# Patient Record
Sex: Female | Born: 1995 | Race: Black or African American | Hispanic: No | Marital: Single | Smoking: Never smoker
Health system: Southern US, Community
[De-identification: ages and names within clinical notes are randomized; demographics above are authoritative.]

## PROBLEM LIST (undated history)

## (undated) DIAGNOSIS — D649 Anemia, unspecified: Secondary | ICD-10-CM

## (undated) DIAGNOSIS — F410 Panic disorder [episodic paroxysmal anxiety] without agoraphobia: Secondary | ICD-10-CM

## (undated) HISTORY — PX: NO PAST SURGERIES: SHX2092

---
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
## 2018-06-02 NOTE — L&D Delivery Note (Signed)
Delivery Note At  0229am a viable and healthy Female "Yolanda Garcia" was delivered via  (Presentation:OA ;  ).  APGAR: 8,9 ; weight pending skin-to-skin  .   Placenta status: delivered intact with 3 vessel  Cord:  with the following complications: precipitous delivery at 36 weeks  Anesthesia:  none Episiotomy:  none Lacerations:  none Suture Repair: NA Est. Blood Loss (mL):    Mom to postpartum.  Baby to Couplet care / Skin to Skin.  [name] 02/14/2019, 2:44 AM

## 2018-07-06 ENCOUNTER — Encounter: Payer: Self-pay | Admitting: Emergency Medicine

## 2018-07-06 ENCOUNTER — Emergency Department: Payer: Medicaid Other

## 2018-07-06 ENCOUNTER — Emergency Department
Admission: EM | Admit: 2018-07-06 | Discharge: 2018-07-06 | Disposition: A | Discharge status: Home / Self Care | Payer: Medicaid Other | Attending: Student in an Organized Health Care Education/Training Program | Admitting: Student in an Organized Health Care Education/Training Program

## 2018-07-06 ENCOUNTER — Other Ambulatory Visit: Payer: Self-pay

## 2018-07-06 DIAGNOSIS — R102 Pelvic and perineal pain: Secondary | ICD-10-CM | POA: Insufficient documentation

## 2018-07-06 LAB — COMPREHENSIVE METABOLIC PANEL
ALK PHOS: 31 U/L — AB (ref 38–126)
ALT: 9 U/L (ref 0–44)
ANION GAP: 5 (ref 5–15)
AST: 17 U/L (ref 15–41)
Albumin: 4.2 g/dL (ref 3.5–5.0)
BUN: 10 mg/dL (ref 6–20)
CO2: 26 mmol/L (ref 22–32)
Calcium: 9 mg/dL (ref 8.9–10.3)
Chloride: 106 mmol/L (ref 98–111)
Creatinine, Ser: 0.61 mg/dL (ref 0.44–1.00)
GFR calc Af Amer: >60 mL/min (ref >60)
GFR calc non Af Amer: >60 mL/min (ref >60)
Glucose, Bld: 74 mg/dL (ref 70–99)
Potassium: 3.7 mmol/L (ref 3.5–5.1)
Sodium: 137 mmol/L (ref 135–145)
Total Bilirubin: 1.6 mg/dL — ABNORMAL HIGH (ref 0.3–1.2)
Total Protein: 7.3 g/dL (ref 6.5–8.1)

## 2018-07-06 LAB — CBC
HCT: 35.7 % — ABNORMAL LOW (ref 36.0–46.0)
Hemoglobin: 11.8 g/dL — ABNORMAL LOW (ref 12.0–15.0)
MCH: 27.4 pg (ref 26.0–34.0)
MCHC: 33.1 g/dL (ref 30.0–36.0)
MCV: 83 fL (ref 80.0–100.0)
Platelets: 217 10*3/uL (ref 150–400)
RBC: 4.3 MIL/uL (ref 3.87–5.11)
RDW: 12.2 % (ref 11.5–15.5)
WBC: 3.6 10*3/uL — ABNORMAL LOW (ref 4.0–10.5)
nRBC: 0 % (ref 0.0–0.2)

## 2018-07-06 LAB — URINALYSIS, COMPLETE (UACMP) WITH MICROSCOPIC
Bacteria, UA: NONE SEEN
Bilirubin Urine: NEGATIVE
Glucose, UA: NEGATIVE mg/dL
Hgb urine dipstick: NEGATIVE
Ketones, ur: NEGATIVE mg/dL
Leukocytes, UA: NEGATIVE
Nitrite: NEGATIVE
Protein, ur: NEGATIVE mg/dL
Specific Gravity, Urine: 1.019 (ref 1.005–1.030)
pH: 6 (ref 5.0–8.0)

## 2018-07-06 LAB — LIPASE, BLOOD: Lipase: 38 U/L (ref 11–51)

## 2018-07-06 LAB — POCT PREGNANCY, URINE: Preg Test, Ur: NEGATIVE

## 2018-07-06 NOTE — ED Notes (Signed)
Pt refused [place]. EDP aware. Pt wants to leave.

## 2018-07-06 NOTE — ED Notes (Signed)
Pt not present when RN arrived in room for DC.

## 2018-07-06 NOTE — ED Triage Notes (Addendum)
PT c/o abd pain x few days with nausea x2wks. Pt in NAD at this, VSS . Pt states positive preg test yesterday. LMP was early last month

## 2018-07-06 NOTE — Discharge Instructions (Signed)

## 2018-07-06 NOTE — ED Provider Notes (Signed)
[facility] Emergency Department Provider Note    First MD Initiated Contact with Patient 07/06/18 1641     (approximate)  I have reviewed the triage vital signs and the nursing notes.   HISTORY  Chief Complaint Abdominal Pain    HPI Yolanda Garcia is a 23 y.o. female presents the ER for evaluation of intermittent pelvic pain nausea and vomiting.  Patient states that she is late for her menstrual cycle her last menstrual cycle starting the beginning of January.  They took 2 home pregnancy test last night that were both positive.  Denies any fevers.  No dysuria.  No vaginal discharge or bleeding.    Past Medical History:  Diagnosis Date  . Panic attack    No family history on file. History reviewed. No pertinent surgical history. There are no active problems to display for this patient.     Prior to Admission medications   Medication Sig Start Date End Date Taking? Authorizing Provider  hydrOXYzine (ATARAX/VISTARIL) 25 MG tablet Take 1 tablet (25 mg total) by mouth 3 (three) times daily as needed. 09/08/16   Tommi Rumps, PA-C  predniSONE (DELTASONE) 10 MG tablet Take 6 tablets  today, on day 2 take 5 tablets, day 3 take 4 tablets, day 4 take 3 tablets, day 5 take  2 tablets and 1 tablet the last day 09/08/16   Tommi Rumps, PA-C  sulfamethoxazole-trimethoprim (BACTRIM,SEPTRA) 400-80 MG tablet Take 2 tablets by mouth 2 (two) times daily. 09/08/16   Tommi Rumps, PA-C    Allergies Patient has no known allergies.    Social History Social History   Tobacco Use  . Smoking status: Never Smoker  . Smokeless tobacco: Never Used  Substance Use Topics  . Alcohol use: No  . Drug use: Not on file    Review of Systems Patient denies headaches, rhinorrhea, blurry vision, numbness, shortness of breath, chest pain, edema, cough, abdominal pain, nausea, vomiting, diarrhea, dysuria, fevers, rashes or hallucinations unless otherwise stated  above in HPI. ____________________________________________   PHYSICAL EXAM:  VITAL SIGNS: Vitals:   07/06/18 1538  BP: (!) 103/52  Pulse: 91  Resp: 16  Temp: 98.4 F (36.9 C)  SpO2: 100%    Constitutional: Alert and oriented.  Eyes: Conjunctivae are normal.  Head: Atraumatic. Nose: No congestion/rhinnorhea. Mouth/Throat: Mucous membranes are moist.   Neck: No stridor. Painless ROM.  Cardiovascular: Normal rate, regular rhythm. Grossly normal heart sounds.  Good peripheral circulation. Respiratory: Normal respiratory effort.  No retractions. Lungs CTAB. Gastrointestinal: Soft and nontender. No distention. No abdominal bruits. No CVA tenderness. Genitourinary: deferred Musculoskeletal: No lower extremity tenderness nor edema.  No joint effusions. Neurologic:  Normal speech and language. No gross focal neurologic deficits are appreciated. No facial droop Skin:  Skin is warm, dry and intact. No rash noted. Psychiatric: Mood and affect are normal. Speech and behavior are normal.  ____________________________________________   LABS (all labs ordered are listed, but only abnormal results are displayed)  Results for orders placed or performed during the hospital encounter of 07/06/18 (from the past 24 hour(s))  Lipase, blood     Status: None   Collection Time: 07/06/18  3:41 PM  Result Value Ref Range   Lipase 38 11 - 51 U/L  Comprehensive metabolic panel     Status: Abnormal   Collection Time: 07/06/18  3:41 PM  Result Value Ref Range   Sodium 137 135 - 145 mmol/L   Potassium 3.7 3.5 - 5.1  mmol/L   Chloride 106 98 - 111 mmol/L   CO2 26 22 - 32 mmol/L   Glucose, Bld 74 70 - 99 mg/dL   BUN 10 6 - 20 mg/dL   Creatinine, Ser 2.950.61 0.44 - 1.00 mg/dL   Calcium 9.0 8.9 - 62.110.3 mg/dL   Total Protein 7.3 6.5 - 8.1 g/dL   Albumin 4.2 3.5 - 5.0 g/dL   AST 17 15 - 41 U/L   ALT 9 0 - 44 U/L   Alkaline Phosphatase 31 (L) 38 - 126 U/L   Total Bilirubin 1.6 (H) 0.3 - 1.2 mg/dL   GFR  calc non Af Amer >60 >60 mL/min   GFR calc Af Amer >60 >60 mL/min   Anion gap 5 5 - 15  CBC     Status: Abnormal   Collection Time: 07/06/18  3:41 PM  Result Value Ref Range   WBC 3.6 (L) 4.0 - 10.5 K/uL   RBC 4.30 3.87 - 5.11 MIL/uL   Hemoglobin 11.8 (L) 12.0 - 15.0 g/dL   HCT 30.835.7 (L) 65.736.0 - 84.646.0 %   MCV 83.0 80.0 - 100.0 fL   MCH 27.4 26.0 - 34.0 pg   MCHC 33.1 30.0 - 36.0 g/dL   RDW 96.212.2 95.211.5 - 84.115.5 %   Platelets 217 150 - 400 K/uL   nRBC 0.0 0.0 - 0.2 %  Urinalysis, Complete w Microscopic     Status: Abnormal   Collection Time: 07/06/18  3:41 PM  Result Value Ref Range   Color, Urine YELLOW (A) YELLOW   APPearance CLEAR (A) CLEAR   Specific Gravity, Urine 1.019 1.005 - 1.030   pH 6.0 5.0 - 8.0   Glucose, UA NEGATIVE NEGATIVE mg/dL   Hgb urine dipstick NEGATIVE NEGATIVE   Bilirubin Urine NEGATIVE NEGATIVE   Ketones, ur NEGATIVE NEGATIVE mg/dL   Protein, ur NEGATIVE NEGATIVE mg/dL   Nitrite NEGATIVE NEGATIVE   Leukocytes, UA NEGATIVE NEGATIVE   RBC / HPF 0-5 0 - 5 RBC/hpf   WBC, UA 0-5 0 - 5 WBC/hpf   Bacteria, UA NONE SEEN NONE SEEN   Squamous Epithelial / LPF 11-20 0 - 5   Mucus PRESENT   Pregnancy, urine POC     Status: None   Collection Time: 07/06/18  4:12 PM  Result Value Ref Range   Preg Test, Ur NEGATIVE NEGATIVE   ____________________________________________ ____________________________________________  RADIOLOGY  I personally reviewed all radiographic images ordered to evaluate for the above acute complaints and reviewed radiology reports and findings.  These findings were personally discussed with the patient.  Please see medical record for radiology report.  ____________________________________________   PROCEDURES  Procedure(s) performed:  Procedures    Critical Care performed: no ____________________________________________   INITIAL IMPRESSION / ASSESSMENT AND PLAN / ED COURSE  Pertinent labs & imaging results that were available during  my care of the patient were reviewed by me and considered in my medical decision making (see chart for details).   DDX: Ectopic, ovarian cyst, torsion, UTI  Yolanda Garcia is a 23 y.o. who presents to the ED with symptoms as described in the setting of presumed pregnancy based on home pregnancy test.  Repeat pregnancy here in the ER is negative.  Uncertain for etiology of abnormal test at home.  Blood work is otherwise reassuring.  Her abdominal exam is soft and benign.  Based on her presentation did recommend pelvic as well as ultrasound.  Patient agreeable  Clinical Course as of Jul 06 1756  Tue Jul 06, 2018  1756 Patient declining pelvic ultrasound at this time.  We discussed the fact that this would limit our ability to exclude more insidious process such as ovarian cyst or other pelvic pathology.  Patient demonstrates understanding of risks.  She is not pregnant is not having any pain at this time, I do believe that a 24-hour period of observation as an outpatient is a reasonable option.  I discussed signs and symptoms which she should return to the ER.   [PR]    Clinical Course User Index [PR] Willy Eddyobinson, Darlyne Schmiesing, MD     As part of my medical decision making, I reviewed the following data within the electronic MEDICAL RECORD NUMBER Nursing notes reviewed and incorporated, Labs reviewed, notes from prior ED visits and [place] Controlled Substance Database   ____________________________________________   FINAL CLINICAL IMPRESSION(S) / ED DIAGNOSES  Final diagnoses:  Pelvic pain  Pelvic pain      NEW MEDICATIONS STARTED DURING THIS VISIT:  New Prescriptions   No medications on file     Note:  This document was prepared using Dragon voice recognition software and may include unintentional dictation errors.    Willy Eddyobinson, Kenly Henckel, MD 07/06/18 [id number] [id number]

## 2018-07-18 ENCOUNTER — Encounter: Payer: Self-pay | Admitting: Emergency Medicine

## 2018-07-18 ENCOUNTER — Emergency Department: Payer: Medicaid Other

## 2018-07-18 ENCOUNTER — Emergency Department
Admission: EM | Admit: 2018-07-18 | Discharge: 2018-07-18 | Disposition: A | Discharge status: Home / Self Care | Payer: Medicaid Other | Attending: Emergency Medicine | Admitting: Emergency Medicine

## 2018-07-18 DIAGNOSIS — R1030 Lower abdominal pain, unspecified: Secondary | ICD-10-CM | POA: Insufficient documentation

## 2018-07-18 DIAGNOSIS — O9989 Other specified diseases and conditions complicating pregnancy, childbirth and the puerperium: Secondary | ICD-10-CM | POA: Diagnosis present

## 2018-07-18 DIAGNOSIS — O26899 Other specified pregnancy related conditions, unspecified trimester: Secondary | ICD-10-CM

## 2018-07-18 DIAGNOSIS — R109 Unspecified abdominal pain: Secondary | ICD-10-CM

## 2018-07-18 DIAGNOSIS — Z3A01 Less than 8 weeks gestation of pregnancy: Secondary | ICD-10-CM | POA: Diagnosis not present

## 2018-07-18 LAB — URINALYSIS, COMPLETE (UACMP) WITH MICROSCOPIC
Bacteria, UA: NONE SEEN
Bilirubin Urine: NEGATIVE
GLUCOSE, UA: NEGATIVE mg/dL
Hgb urine dipstick: NEGATIVE
Ketones, ur: NEGATIVE mg/dL
Leukocytes,Ua: NEGATIVE
Nitrite: NEGATIVE
Protein, ur: NEGATIVE mg/dL
Specific Gravity, Urine: 1.019 (ref 1.005–1.030)
pH: 6 (ref 5.0–8.0)

## 2018-07-18 LAB — LIPASE, BLOOD: Lipase: 36 U/L (ref 11–51)

## 2018-07-18 LAB — COMPREHENSIVE METABOLIC PANEL
ALBUMIN: 4.2 g/dL (ref 3.5–5.0)
ALT: 10 U/L (ref 0–44)
AST: 19 U/L (ref 15–41)
Alkaline Phosphatase: 33 U/L — ABNORMAL LOW (ref 38–126)
Anion gap: 5 (ref 5–15)
BUN: 11 mg/dL (ref 6–20)
CO2: 25 mmol/L (ref 22–32)
Calcium: 9 mg/dL (ref 8.9–10.3)
Chloride: 106 mmol/L (ref 98–111)
Creatinine, Ser: 0.51 mg/dL (ref 0.44–1.00)
GFR calc Af Amer: >60 mL/min (ref >60)
GFR calc non Af Amer: >60 mL/min (ref >60)
Glucose, Bld: 87 mg/dL (ref 70–99)
Potassium: 3.5 mmol/L (ref 3.5–5.1)
Sodium: 136 mmol/L (ref 135–145)
Total Bilirubin: 1.6 mg/dL — ABNORMAL HIGH (ref 0.3–1.2)
Total Protein: 6.6 g/dL (ref 6.5–8.1)

## 2018-07-18 LAB — POCT PREGNANCY, URINE: Preg Test, Ur: POSITIVE — AB

## 2018-07-18 LAB — HCG, QUANTITATIVE, PREGNANCY: hCG, Beta Chain, Quant, S: 42599 m[IU]/mL — ABNORMAL HIGH (ref <5)

## 2018-07-18 LAB — CBC
HCT: 33.2 % — ABNORMAL LOW (ref 36.0–46.0)
Hemoglobin: 11.1 g/dL — ABNORMAL LOW (ref 12.0–15.0)
MCH: 27.2 pg (ref 26.0–34.0)
MCHC: 33.4 g/dL (ref 30.0–36.0)
MCV: 81.4 fL (ref 80.0–100.0)
Platelets: 201 10*3/uL (ref 150–400)
RBC: 4.08 MIL/uL (ref 3.87–5.11)
RDW: 11.9 % (ref 11.5–15.5)
WBC: 3.6 10*3/uL — ABNORMAL LOW (ref 4.0–10.5)
nRBC: 0 % (ref 0.0–0.2)

## 2018-07-18 NOTE — ED Notes (Signed)
Patient transported to Ultrasound 

## 2018-07-18 NOTE — ED Triage Notes (Signed)
Pt c/o abdominal cramping was seen for same on 2/4 in ED was told she was not pregnant and since pt went to health department and was told she is [redacted] weeks pregnant. Pt to ED due to the cramping. Pt denies vaginal bleeding.

## 2018-07-18 NOTE — ED Provider Notes (Signed)
[facility] Emergency Department Provider Note  ____________________________________________   First MD Initiated Contact with Patient 07/18/18 [phone]     (approximate)  I have reviewed the triage vital signs and the nursing notes.   HISTORY  Chief Complaint Abdominal Pain    HPI Yolanda Garcia is a 23 y.o. female G1P0 at about [redacted] weeks gestation based on date of last menstrual period.  She presents for evaluation of intermittent and sometimes severe lower abdominal/pelvic cramping.  She was seen in this emergency department not quite 2 weeks ago after having some positive home pregnancy test.  Her urine pregnancy test was negative in the ED and she declined having an ultrasound, but she reports that she then followed up at the health department and her pricey test was positive and they told her she was [redacted] weeks gestation (that was based on her LMP).  She continues to have intermittent cramping so she wanted to get checked out.  She denies vaginal bleeding.  She denies fever/chills, chest pain, shortness of breath, nausea, vomiting, and any other abdominal pain.  This is the first time she has been pregnant.  She reports that her partner is relatively new to her and is "a friend".  Past Medical History:  Diagnosis Date  . Panic attack     There are no active problems to display for this patient.   History reviewed. No pertinent surgical history.  Prior to Admission medications   Medication Sig Start Date End Date Taking? Authorizing Provider  hydrOXYzine (ATARAX/VISTARIL) 25 MG tablet Take 1 tablet (25 mg total) by mouth 3 (three) times daily as needed. 09/08/16   Tommi RumpsSummers, Rhonda L, PA-C  predniSONE (DELTASONE) 10 MG tablet Take 6 tablets  today, on day 2 take 5 tablets, day 3 take 4 tablets, day 4 take 3 tablets, day 5 take  2 tablets and 1 tablet the last day 09/08/16   Tommi RumpsSummers, Rhonda L, PA-C  sulfamethoxazole-trimethoprim (BACTRIM,SEPTRA) 400-80 MG tablet  Take 2 tablets by mouth 2 (two) times daily. 09/08/16   Tommi RumpsSummers, Rhonda L, PA-C    Allergies Patient has no known allergies.  History reviewed. No pertinent family history.  Social History Social History   Tobacco Use  . Smoking status: Never Smoker  . Smokeless tobacco: Never Used  Substance Use Topics  . Alcohol use: No  . Drug use: Not on file    Review of Systems Constitutional: No fever/chills Eyes: No visual changes. ENT: No sore throat. Cardiovascular: Denies chest pain. Respiratory: Denies shortness of breath. Gastrointestinal: Lower abdominal/pelvic cramping.  No nausea, vomiting, nor diarrhea Genitourinary: No vaginal bleeding.  Negative for dysuria. Musculoskeletal: Negative for neck pain.  Negative for back pain. Integumentary: Negative for rash. Neurological: Negative for headaches, focal weakness or numbness.   ____________________________________________   PHYSICAL EXAM:  VITAL SIGNS: ED Triage Vitals  Enc Vitals Group     BP 07/18/18 0049 (!) 99/57     Pulse Rate 07/18/18 0049 92     Resp 07/18/18 0049 18     Temp 07/18/18 0049 98 F (36.7 C)     Temp Source 07/18/18 0049 Oral     SpO2 07/18/18 0049 98 %     Weight --      Height --      Head Circumference --      Peak Flow --      Pain Score 07/18/18 0529 0     Pain Loc --      Pain  Edu? --      Excl. in GC? --     Constitutional: Alert and oriented. Well appearing and in no acute distress. Eyes: Conjunctivae are normal.  Head: Atraumatic. Nose: No congestion/rhinnorhea. Mouth/Throat: Mucous membranes are moist. Neck: No stridor.  No meningeal signs.   Cardiovascular: Normal rate, regular rhythm. Good peripheral circulation. Grossly normal heart sounds. Respiratory: Normal respiratory effort.  No retractions. Lungs CTAB. Gastrointestinal: Soft and nontender. No distention.  Genitourinary: Patient declined pelvic exam Musculoskeletal: No lower extremity tenderness nor edema. No gross  deformities of extremities. Neurologic:  Normal speech and language. No gross focal neurologic deficits are appreciated.  Skin:  Skin is warm, dry and intact. No rash noted. Psychiatric: Mood and affect are normal. Speech and behavior are normal.  ____________________________________________   LABS (all labs ordered are listed, but only abnormal results are displayed)  Labs Reviewed  COMPREHENSIVE METABOLIC PANEL - Abnormal; Notable for the following components:      Result Value   Alkaline Phosphatase 33 (*)    Total Bilirubin 1.6 (*)    All other components within normal limits  CBC - Abnormal; Notable for the following components:   WBC 3.6 (*)    Hemoglobin 11.1 (*)    HCT 33.2 (*)    All other components within normal limits  URINALYSIS, COMPLETE (UACMP) WITH MICROSCOPIC - Abnormal; Notable for the following components:   Color, Urine YELLOW (*)    APPearance CLEAR (*)    All other components within normal limits  HCG, QUANTITATIVE, PREGNANCY - Abnormal; Notable for the following components:   hCG, Beta Chain, Quant, S 42,599 (*)    All other components within normal limits  POCT PREGNANCY, URINE - Abnormal; Notable for the following components:   Preg Test, Ur POSITIVE (*)    All other components within normal limits  LIPASE, BLOOD   ____________________________________________  EKG  None - EKG not ordered by ED physician ____________________________________________  RADIOLOGY   ED MD interpretation: Pelvic ultrasound is pending at time of sign-out     ____________________________________________   PROCEDURES  Critical Care performed: No   Procedure(s) performed:   Procedures   ____________________________________________   INITIAL IMPRESSION / ASSESSMENT AND PLAN / ED COURSE  As part of my medical decision making, I reviewed the following data within the electronic MEDICAL RECORD NUMBER Nursing notes reviewed and incorporated, Labs reviewed , Old  chart reviewed and Notes from prior ED visits    Differential diagnosis includes, but is not limited to, normal early pregnancy, ectopic pregnancy, missed abortion or nonviable pregnancy, STD/PID.  Lab results are generally reassuring with a positive urine pregnancy test and an hCG of about 42,000.  Vital signs are stable.  I recommended that we do a pelvic exam and take swabs but she declines in favor of following up with [facility] as scheduled in a couple of weeks for her prenatal appointment.  Given that she is having no other pelvic symptoms I think this is appropriate.  She has no tenderness to palpation of the abdomen.  I am ordering an ultrasound for her because of her concern over the cramping and to see if we can visualize an intrauterine pregnancy.  This is her second ED visit and she has been to the health department and I think it is reasonable to rule out an ectopic pregnancy even though she is not having any vaginal bleeding.  I did warn the patient that she may be too early along to visualize  a fetus at this time but we will check for any acute abnormalities and likely she will be able to follow-up with [facility].  She understands and agrees with the plan.  Clinical Course as of Jul 18 901  Wynelle Link Jul 18, 2018  0725 Patient is awaiting ultrasounds.  I discussed the case with Dr. Don Perking who is taking over for me in the emergency department.  Both the patient and Dr. Don Perking noted that the patient should be able to be discharged assuming that the ultrasound is not showing an acute or emergent medical condition such as an ectopic pregnancy.   [CF]    Clinical Course User Index [CF] Loleta Rose, MD    ____________________________________________  FINAL CLINICAL IMPRESSION(S) / ED DIAGNOSES  Final diagnoses:  Abdominal pain affecting pregnancy     MEDICATIONS GIVEN DURING THIS VISIT:  Medications - No data to display   ED Discharge Orders    None       Note:   This document was prepared using Dragon voice recognition software and may include unintentional dictation errors.   Loleta Rose, MD 07/18/18 [phone]

## 2018-07-18 NOTE — ED Notes (Signed)
Pt reports finding out she was pregnant 9 days ago; for the last week she's been having intermittent abd cramping that lasts for a few seconds and then resolves on it's own; pt says she had an episode tonight that brought her to the ED and has not had another episode while she's been waiting the last 5 hours for a treatment room; denies vaginal bleeding; LMP 06/02/18; pt says she has been seen at the health department and given a due date of 03/09/19; G1P0; abd soft, BS x 4; lungs clear; HRR; pt in no distress at this time; visitor x 1 at bedside;

## 2018-07-18 NOTE — ED Provider Notes (Signed)
_________________________ 9:01 AM on 07/18/2018 -----------------------------------------  US showing gestational sac with no fetal pole.  Recommended follow-up in 2 weeks for repeat ultrasound.  Patient has an appointment in 2 weeks with OB/GYN.  I explained to the patient that this could be early pregnancy versus a nonviable pregnancy.  If it is nonviable explained to her that she may have a heavier than normal menstrual.  Discussed in a return precautions. Currently no vaginal bleeding. Abdomen is soft with no tenderness on exam.       I have personally reviewed the images performed during this visit and I agree with the Radiologist's read.   Interpretation by Radiologist:  US Ob Transvaginal  Result Date: 07/18/2018 CLINICAL DATA:  Abdominal/pelvic cramping EXAM: OBSTETRIC <14 WK [place] AND TRANSVAGINAL OB US TECHNIQUE: Both transabdominal and transvaginal ultrasound examinations were performed for complete evaluation of the gestation as well as the maternal uterus, adnexal regions, and pelvic cul-de-sac. Transvaginal technique was performed to assess early pregnancy. COMPARISON:  None. FINDINGS: Intrauterine gestational sac: Visualized Yolk sac:  Visualized Embryo:  Not visualized Cardiac Activity: Not visualized. MSD: 16 mm   6 w   3 d Subchorionic hemorrhage:  None visualized. Maternal uterus/adnexae: Cervical os appears closed. Right ovary measures 3.3 x 2.3 x 2.0 cm. Left ovary measures 3.8 x 2.8 x 2.6 cm. Note that there is a corpus luteum on the left measuring 2.9 x 1.8 x 1.7 cm. A small amount of nearby free pelvic fluid noted. IMPRESSION: 1. Gestational sac is seen. Yolk sac is appreciable. Currently fetal pole not seen. Given this circumstance, advise follow-up study in 10-14 days to assess for fetal pole and fetal heart activity. 2.  No subchorionic hemorrhage evident. 3. Apparent corpus luteum on the left. Small amount nearby fluid may indicate leakage of fluid from the corpus luteum. No  other extrauterine pelvic mass. Electronically Signed   By: Bretta Bang III M.D.   On: 07/18/2018 08:19      Nita Sickle, MD 07/18/18 [phone]

## 2018-07-19 ENCOUNTER — Ambulatory Visit (INDEPENDENT_AMBULATORY_CARE_PROVIDER_SITE_OTHER): Payer: Medicaid Other | Admitting: Certified Nurse Midwife

## 2018-07-19 ENCOUNTER — Encounter: Payer: Self-pay | Admitting: Certified Nurse Midwife

## 2018-07-19 ENCOUNTER — Encounter: Payer: Medicaid Other | Admitting: Certified Nurse Midwife

## 2018-07-19 VITALS — BP 93/62 | HR 73 | Ht 63.0 in | Wt 95.6 lb

## 2018-07-19 DIAGNOSIS — R109 Unspecified abdominal pain: Secondary | ICD-10-CM

## 2018-07-19 DIAGNOSIS — O26891 Other specified pregnancy related conditions, first trimester: Secondary | ICD-10-CM

## 2018-07-19 DIAGNOSIS — Z3490 Encounter for supervision of normal pregnancy, unspecified, unspecified trimester: Secondary | ICD-10-CM

## 2018-07-19 NOTE — Patient Instructions (Signed)
Miscarriage  A miscarriage is the loss of an unborn baby (fetus) before the 20th week of pregnancy.  Follow these instructions at home:  Medicines     Take over-the-counter and prescription medicines only as told by your doctor.   If you were prescribed antibiotic medicine, take it as told by your doctor. Do not stop taking the antibiotic even if you start to feel better.   Do not take NSAIDs unless your doctor says that this is safe for you. NSAIDs include aspirin and ibuprofen. These medicines can cause bleeding.  Activity   Rest as directed. Ask your doctor what activities are safe for you.   Have someone help you at home during this time.  General instructions   Write down how many pads you use each day and how soaked they are.   Watch the amount of tissue or clumps of blood (blood clots) that you pass from your vagina. Save any large amounts of tissue for your doctor.   Do not use tampons, douche, or have sex until your doctor approves.   To help you and your partner with the process of grieving, talk with your doctor or seek counseling.   When you are ready, meet with your doctor to talk about steps you should take for your health. Also, talk with your doctor about steps to take to have a healthy pregnancy in the future.   Keep all follow-up visits as told by your doctor. This is important.  Contact a doctor if:   You have a fever or chills.   You have vaginal discharge that smells bad.   You have more bleeding.  Get help right away if:   You have very bad cramps or pain in your back or belly.   You pass clumps of blood that are walnut-sized or larger from your vagina.   You pass tissue that is walnut-sized or larger from your vagina.   You soak more than 1 regular pad in an hour.   You get light-headed or weak.   You faint (pass out).   You have feelings of sadness that do not go away, or you have thoughts of hurting yourself.  Summary   A miscarriage is the loss of an unborn baby before  the 20th week of pregnancy.   Follow your doctor's instructions for home care. Keep all follow-up appointments.   To help you and your partner with the process of grieving, talk with your doctor or seek counseling.  This information is not intended to replace advice given to you by your health care provider. Make sure you discuss any questions you have with your health care provider.  Document Released: 08/11/2011 Document Revised: 06/24/2016 Document Reviewed: 06/24/2016  Elsevier Interactive Patient Education  2019 [employer]  First Trimester of Pregnancy    The first trimester of pregnancy is from week 1 until the end of week 13 (months 1 through 3). During this time, your baby will begin to develop inside you. At 6-8 weeks, the eyes and face are formed, and the heartbeat can be seen on ultrasound. At the end of 12 weeks, all the baby's organs are formed. Prenatal care is all the medical care you receive before the birth of your baby. Make sure you get good prenatal care and follow all of your doctor's instructions.  Follow these instructions at home:  Medicines   Take over-the-counter and prescription medicines only as told by your doctor. Some medicines are safe and some medicines   are not safe during pregnancy.   Take a prenatal vitamin that contains at least 600 micrograms (mcg) of folic acid.   If you have trouble pooping (constipation), take medicine that will make your stool soft (stool softener) if your doctor approves.  Eating and drinking     Eat regular, healthy meals.   Your doctor will tell you the amount of weight gain that is right for you.   Avoid raw meat and uncooked cheese.   If you feel sick to your stomach (nauseous) or throw up (vomit):  ? Eat 4 or 5 small meals a day instead of 3 large meals.  ? Try eating a few soda crackers.  ? Drink liquids between meals instead of during meals.   To prevent constipation:  ? Eat foods that are high in fiber, like fresh fruits and vegetables,  whole grains, and beans.  ? Drink enough fluids to keep your pee (urine) clear or pale yellow.  Activity   Exercise only as told by your doctor. Stop exercising if you have cramps or pain in your lower belly (abdomen) or low back.   Do not exercise if it is too hot, too humid, or if you are in a place of great height (high altitude).   Try to avoid standing for long periods of time. Move your legs often if you must stand in one place for a long time.   Avoid heavy lifting.   Wear low-heeled shoes. Sit and stand up straight.   You can have sex unless your doctor tells you not to.  Relieving pain and discomfort   Wear a good support bra if your breasts are sore.   Take warm water baths (sitz baths) to soothe pain or discomfort caused by hemorrhoids. Use hemorrhoid cream if your doctor says it is okay.   Rest with your legs raised if you have leg cramps or low back pain.   If you have puffy, bulging veins (varicose veins) in your legs:  ? Wear support hose or compression stockings as told by your doctor.  ? Raise (elevate) your feet for 15 minutes, 3-4 times a day.  ? Limit salt in your food.  Prenatal care   Schedule your prenatal visits by the twelfth week of pregnancy.   Write down your questions. Take them to your prenatal visits.   Keep all your prenatal visits as told by your doctor. This is important.  Safety   Wear your seat belt at all times when driving.   Make a list of emergency phone numbers. The list should include numbers for family, friends, the hospital, and police and fire departments.  General instructions   Ask your doctor for a referral to a local prenatal class. Begin classes no later than at the start of month 6 of your pregnancy.   Ask for help if you need counseling or if you need help with nutrition. Your doctor can give you advice or tell you where to go for help.   Do not use hot tubs, steam rooms, or saunas.   Do not douche or use tampons or scented sanitary pads.   Do  not cross your legs for long periods of time.   Avoid all herbs and alcohol. Avoid drugs that are not approved by your doctor.   Do not use any tobacco products, including cigarettes, chewing tobacco, and electronic cigarettes. If you need help quitting, ask your doctor. You may get counseling or other support to help you quit.     Avoid cat litter boxes and soil used by cats. These carry germs that can cause birth defects in the baby and can cause a loss of your baby (miscarriage) or stillbirth.   Visit your dentist. At home, brush your teeth with a soft toothbrush. Be gentle when you floss.  Contact a doctor if:   You are dizzy.   You have mild cramps or pressure in your lower belly.   You have a nagging pain in your belly area.   You continue to feel sick to your stomach, you throw up, or you have watery poop (diarrhea).   You have a bad smelling fluid coming from your vagina.   You have pain when you pee (urinate).   You have increased puffiness (swelling) in your face, hands, legs, or ankles.  Get help right away if:   You have a fever.   You are leaking fluid from your vagina.   You have spotting or bleeding from your vagina.   You have very bad belly cramping or pain.   You gain or lose weight rapidly.   You throw up blood. It may look like coffee grounds.   You are around people who have German measles, fifth disease, or chickenpox.   You have a very bad headache.   You have shortness of breath.   You have any kind of trauma, such as from a fall or a car accident.  Summary   The first trimester of pregnancy is from week 1 until the end of week 13 (months 1 through 3).   To take care of yourself and your unborn baby, you will need to eat healthy meals, take medicines only if your doctor tells you to do so, and do activities that are safe for you and your baby.   Keep all follow-up visits as told by your doctor. This is important as your doctor will have to ensure that your baby is  healthy and growing well.  This information is not intended to replace advice given to you by your health care provider. Make sure you discuss any questions you have with your health care provider.  Document Released: 11/05/2007 Document Revised: 05/27/2016 Document Reviewed: 05/27/2016  Elsevier Interactive Patient Education  2019 [employer]

## 2018-07-19 NOTE — Progress Notes (Signed)
Patient here for ER follow-up, LMP was 06/02/2018.  Patient will like to discuss ultrasound done while in the ER.

## 2018-07-19 NOTE — Progress Notes (Signed)
GYN ENCOUNTER NOTE  Subjective:       Yolanda Garcia is a 23 y.o. G1P0 female here for ER follow up.   Seen yesterday in the [facility] ER for abdominal cramping in early pregnancy. Pt states that ER Physician told her she was having a miscarriage. She is accompanied by her cousin.   Denies difficulty breathing or respiratory distress, chest pain, vaginal bleeding, dysuria, and leg pain or swelling.    Gynecologic History  Patient's last menstrual period was 06/02/2018 (exact date).  Period Cycle (Days): 28 Period Duration (Days): Three (3) Period Pattern: Regular Menstrual Flow: Moderate Menstrual Control: Thin pad Dysmenorrhea: (!) Mild Dysmenorrhea Symptoms: Cramping  Contraception: none  Last Pap: N/A.   Obstetric History  OB History  Gravida Para Term Preterm AB Living  1            SAB TAB Ectopic Multiple Live Births               # Outcome Date GA Lbr Len/2nd Weight Sex Delivery Anes PTL Lv  1 Current             Past Medical History:  Diagnosis Date  . Panic attack     No past surgical history on file.  Current Outpatient Medications on File Prior to Visit  Medication Sig Dispense Refill  . Prenatal Vit-Fe Fumarate-FA (MULTIVITAMIN-PRENATAL) 27-0.8 MG TABS tablet Take 1 tablet by mouth daily at 12 noon.     No current facility-administered medications on file prior to visit.     No Known Allergies  Social History   Socioeconomic History  . Marital status: Single    Spouse name: Not on file  . Number of children: Not on file  . Years of education: Not on file  . Highest education level: Not on file  Occupational History  . Not on file  Social Needs  . Financial resource strain: Not on file  . Food insecurity:    Worry: Not on file    Inability: Not on file  . Transportation needs:    Medical: Not on file    Non-medical: Not on file  Tobacco Use  . Smoking status: Never Smoker  . Smokeless tobacco: Never Used  Substance and Sexual Activity   . Alcohol use: No  . Drug use: Not on file  . Sexual activity: Not on file  Lifestyle  . Physical activity:    Days per week: Not on file    Minutes per session: Not on file  . Stress: Not on file  Relationships  . Social connections:    Talks on phone: Not on file    Gets together: Not on file    Attends religious service: Not on file    Active member of club or organization: Not on file    Attends meetings of clubs or organizations: Not on file    Relationship status: Not on file  . Intimate partner violence:    Fear of current or ex partner: Not on file    Emotionally abused: Not on file    Physically abused: Not on file    Forced sexual activity: Not on file  Other Topics Concern  . Not on file  Social History Narrative  . Not on file    No family history on file.  The following portions of the patient's history were reviewed and updated as appropriate: allergies, current medications, past family history, past medical history, past social history, past surgical history and problem  list.  Review of Systems  ROS negative except as noted above. Information obtained from patient.   Objective:   BP 93/62   Pulse 73   Ht 5\' 3"  (1.6 m)   Wt 95 lb 9.6 oz (43.4 kg)   LMP 06/02/2018 (Exact Date)   BMI 16.93 kg/m    CONSTITUTIONAL: Well-developed, well-nourished female in no acute distress.    Collection Time: 07/18/18  1:01 AM  Result Value Ref Range   Lipase 36 11 - 51 U/L    Comment: Performed at [facility], [address] [place], [place] [address]  Comprehensive metabolic panel     Status: Abnormal   Collection Time: 07/18/18  1:01 AM  Result Value Ref Range   Sodium 136 135 - 145 mmol/L   Potassium 3.5 3.5 - 5.1 mmol/L   Chloride 106 98 - 111 mmol/L   CO2 25 22 - 32 mmol/L   Glucose, Bld 87 70 - 99 mg/dL   BUN 11 6 - 20 mg/dL   Creatinine, Ser 9.41 0.44 - 1.00 mg/dL   Calcium 9.0 8.9 - 74.0 mg/dL   Total Protein 6.6 6.5 - 8.1 g/dL   Albumin  4.2 3.5 - 5.0 g/dL   AST 19 15 - 41 U/L   ALT 10 0 - 44 U/L   Alkaline Phosphatase 33 (L) 38 - 126 U/L   Total Bilirubin 1.6 (H) 0.3 - 1.2 mg/dL   GFR calc non Af Amer >60 >60 mL/min   GFR calc Af Amer >60 >60 mL/min   Anion gap 5 5 - 15    Comment: Performed at [facility], [address] [place], [place] [address]  CBC     Status: Abnormal   Collection Time: 07/18/18  1:01 AM  Result Value Ref Range   WBC 3.6 (L) 4.0 - 10.5 K/uL   RBC 4.08 3.87 - 5.11 MIL/uL   Hemoglobin 11.1 (L) 12.0 - 15.0 g/dL   HCT 18.5 (L) 63.1 - 49.7 %   MCV 81.4 80.0 - 100.0 fL   MCH 27.2 26.0 - 34.0 pg   MCHC 33.4 30.0 - 36.0 g/dL   RDW 02.6 37.8 - 58.8 %   Platelets 201 150 - 400 K/uL   nRBC 0.0 0.0 - 0.2 %    Comment: Performed at [facility], [address] [place], [place] [address]  hCG, quantitative, pregnancy     Status: Abnormal   Collection Time: 07/18/18  1:01 AM  Result Value Ref Range   hCG, Beta Chain, Quant, S 42,599 (H) <5 mIU/mL    Comment:          GEST. AGE      CONC.  (mIU/mL)   <=1 WEEK        5 - 50     2 WEEKS       50 - 500     3 WEEKS       100 - 10,000     4 WEEKS     1,000 - 30,000     5 WEEKS     3,500 - 115,000   6-8 WEEKS     12,000 - 270,000    12 WEEKS     15,000 - 220,000        FEMALE AND NON-PREGNANT FEMALE:     LESS THAN 5 mIU/mL Performed at [facility], [address] [place], [place] [address]   Urinalysis, Complete w Microscopic  Status: Abnormal   Collection Time: 07/18/18  1:02 AM  Result Value Ref Range   Color, Urine YELLOW (A) YELLOW   APPearance CLEAR (A) CLEAR   Specific Gravity, Urine 1.019 1.005 - 1.030   pH 6.0 5.0 - 8.0   Glucose, UA NEGATIVE NEGATIVE mg/dL   Hgb urine dipstick NEGATIVE NEGATIVE   Bilirubin Urine NEGATIVE NEGATIVE   Ketones, ur NEGATIVE NEGATIVE mg/dL   Protein, ur NEGATIVE NEGATIVE mg/dL   Nitrite NEGATIVE NEGATIVE   Leukocytes,Ua NEGATIVE NEGATIVE   RBC / HPF 0-5 0 - 5 RBC/hpf    WBC, UA 0-5 0 - 5 WBC/hpf   Bacteria, UA NONE SEEN NONE SEEN   Squamous Epithelial / LPF 6-10 0 - 5   Mucus PRESENT     Comment: Performed at [facility], [address] [place], [place] [address]  Pregnancy, urine POC     Status: Abnormal   Collection Time: 07/18/18  1:12 AM  Result Value Ref Range   Preg Test, Ur POSITIVE (A) NEGATIVE    Comment:        THE SENSITIVITY OF THIS METHODOLOGY IS >24 mIU/mL     EXAM: OBSTETRIC <14 WK [place] AND TRANSVAGINAL OB US ([facility], 07/18/2018)  TECHNIQUE: Both transabdominal and transvaginal ultrasound examinations were performed for complete evaluation of the gestation as well as the maternal uterus, adnexal regions, and pelvic cul-de-sac. Transvaginal technique was performed to assess early pregnancy.  COMPARISON:  None.  FINDINGS: Intrauterine gestational sac: Visualized  Yolk sac:  Visualized  Embryo:  Not visualized  Cardiac Activity: Not visualized.  MSD: 16 mm   6 w   3 d  Subchorionic hemorrhage:  None visualized.  Maternal uterus/adnexae: Cervical os appears closed. Right ovary measures 3.3 x 2.3 x 2.0 cm. Left ovary measures 3.8 x 2.8 x 2.6 cm. Note that there is a corpus luteum on the left measuring 2.9 x 1.8 x 1.7 cm. A small amount of nearby free pelvic fluid noted.  IMPRESSION: 1. Gestational sac is seen. Yolk sac is appreciable. Currently fetal pole not seen. Given this circumstance, advise follow-up study in 10-14 days to assess for fetal pole and fetal heart activity.  2.  No subchorionic hemorrhage evident.  3. Apparent corpus luteum on the left. Small amount nearby fluid may indicate leakage of fluid from the corpus luteum. No other extrauterine pelvic mass.    Assessment:   1. Early stage of pregnancy  - Beta HCG, Quant  2. Abdominal pain during pregnancy in first trimester   Plan:   Ultrasound and lab results from ER visit reviewed with patient, verbalized understanding.    Discussed early pregnancy vs nonviable pregnancy.   Will repeat beta tomorrow.   Will repeat ultrasound next week.   Reviewed red flag symptoms and when to call.   RTC x 1 day for labs. RTC x 1 week for ultrasound or sooner if needed.    Gunnar Bulla, CNM [facility], [facility] 07/19/18 2:32 PM    A total of 30 minutes were spent face-to-face with the patient during the encounter with greater than 50% dealing with counseling and coordination of care.

## 2018-07-20 ENCOUNTER — Other Ambulatory Visit: Payer: Medicaid Other

## 2018-07-21 LAB — BETA HCG QUANT (REF LAB): hCG Quant: 47491 m[IU]/mL

## 2018-07-21 NOTE — Progress Notes (Signed)
Please contact pt, hCG slightly increased. Continue to monitor for vaginal bleeding and abdominal pain. Keep scheduled ultrasound and appt. Thanks, JML

## 2018-07-22 ENCOUNTER — Other Ambulatory Visit: Payer: Self-pay

## 2018-07-22 ENCOUNTER — Telehealth: Payer: Self-pay | Admitting: Certified Nurse Midwife

## 2018-07-22 ENCOUNTER — Encounter: Payer: Self-pay | Admitting: Emergency Medicine

## 2018-07-22 ENCOUNTER — Emergency Department
Admission: EM | Admit: 2018-07-22 | Discharge: 2018-07-22 | Disposition: A | Discharge status: Home / Self Care | Payer: Medicaid Other | Attending: Emergency Medicine | Admitting: Emergency Medicine

## 2018-07-22 ENCOUNTER — Telehealth: Payer: Self-pay

## 2018-07-22 DIAGNOSIS — Z3A01 Less than 8 weeks gestation of pregnancy: Secondary | ICD-10-CM | POA: Insufficient documentation

## 2018-07-22 DIAGNOSIS — O219 Vomiting of pregnancy, unspecified: Secondary | ICD-10-CM | POA: Insufficient documentation

## 2018-07-22 DIAGNOSIS — O210 Mild hyperemesis gravidarum: Secondary | ICD-10-CM

## 2018-07-22 LAB — CBC WITH DIFFERENTIAL/PLATELET
Abs Immature Granulocytes: 0.01 10*3/uL (ref 0.00–0.07)
Basophils Absolute: 0 10*3/uL (ref 0.0–0.1)
Basophils Relative: 1 %
Eosinophils Absolute: 0.1 10*3/uL (ref 0.0–0.5)
Eosinophils Relative: 1 %
HCT: 34.8 % — ABNORMAL LOW (ref 36.0–46.0)
Hemoglobin: 11.8 g/dL — ABNORMAL LOW (ref 12.0–15.0)
IMMATURE GRANULOCYTES: 0 %
Lymphocytes Relative: 34 %
Lymphs Abs: 1.3 10*3/uL (ref 0.7–4.0)
MCH: 27.3 pg (ref 26.0–34.0)
MCHC: 33.9 g/dL (ref 30.0–36.0)
MCV: 80.6 fL (ref 80.0–100.0)
Monocytes Absolute: 0.4 10*3/uL (ref 0.1–1.0)
Monocytes Relative: 11 %
Neutro Abs: 2 10*3/uL (ref 1.7–7.7)
Neutrophils Relative %: 53 %
PLATELETS: 212 10*3/uL (ref 150–400)
RBC: 4.32 MIL/uL (ref 3.87–5.11)
RDW: 11.7 % (ref 11.5–15.5)
WBC: 3.9 10*3/uL — ABNORMAL LOW (ref 4.0–10.5)
nRBC: 0 % (ref 0.0–0.2)

## 2018-07-22 LAB — URINALYSIS, COMPLETE (UACMP) WITH MICROSCOPIC
BACTERIA UA: NONE SEEN
Bilirubin Urine: NEGATIVE
Glucose, UA: NEGATIVE mg/dL
Hgb urine dipstick: NEGATIVE
Ketones, ur: 80 mg/dL — AB
Leukocytes,Ua: NEGATIVE
Nitrite: NEGATIVE
Protein, ur: NEGATIVE mg/dL
SPECIFIC GRAVITY, URINE: 1.023 (ref 1.005–1.030)
pH: 5 (ref 5.0–8.0)

## 2018-07-22 LAB — COMPREHENSIVE METABOLIC PANEL
ALT: 10 U/L (ref 0–44)
AST: 19 U/L (ref 15–41)
Albumin: 4.4 g/dL (ref 3.5–5.0)
Alkaline Phosphatase: 30 U/L — ABNORMAL LOW (ref 38–126)
Anion gap: 8 (ref 5–15)
BUN: 10 mg/dL (ref 6–20)
CALCIUM: 9.1 mg/dL (ref 8.9–10.3)
CO2: 23 mmol/L (ref 22–32)
Chloride: 103 mmol/L (ref 98–111)
Creatinine, Ser: 0.56 mg/dL (ref 0.44–1.00)
GFR calc Af Amer: >60 mL/min (ref >60)
GFR calc non Af Amer: >60 mL/min (ref >60)
Glucose, Bld: 90 mg/dL (ref 70–99)
Potassium: 3.5 mmol/L (ref 3.5–5.1)
Sodium: 134 mmol/L — ABNORMAL LOW (ref 135–145)
Total Bilirubin: 1.6 mg/dL — ABNORMAL HIGH (ref 0.3–1.2)
Total Protein: 7.4 g/dL (ref 6.5–8.1)

## 2018-07-22 LAB — HCG, QUANTITATIVE, PREGNANCY: hCG, Beta Chain, Quant, S: 82495 m[IU]/mL — ABNORMAL HIGH (ref <5)

## 2018-07-22 MED ORDER — ONDANSETRON HCL 4 MG/2ML IJ SOLN
4.0000 mg | Freq: Once | INTRAMUSCULAR | Status: AC
  Administered 2018-07-22: 4 mg via INTRAVENOUS
  Filled 2018-07-22: qty 2

## 2018-07-22 MED ORDER — SODIUM CHLORIDE 0.9 % IV SOLN
1000.0000 mL | Freq: Once | INTRAVENOUS | Status: AC
  Administered 2018-07-22: 1000 mL via INTRAVENOUS

## 2018-07-22 NOTE — ED Triage Notes (Signed)
Patient is [redacted] weeks pregnant and started vomiting yesterday morning. Patient denies any fever.

## 2018-07-22 NOTE — ED Provider Notes (Signed)
[facility] Emergency Department Provider Note   ____________________________________________    I have reviewed the triage vital signs and the nursing notes.   HISTORY  Chief Complaint Emesis     HPI Yolanda Garcia is a 23 y.o. female who presents with complaints of nausea vomiting.  Patient is approximately [redacted] weeks pregnant and notes she has been unable to tolerate p.o.'s over the last 24 hours.  She denies abdominal pain.  No vaginal bleeding.  No fevers or chills.  Has not yet seen OB.  This is her first pregnancy.  Has tried ginger ale but unable to tolerate  Past Medical History:  Diagnosis Date  . Panic attack     There are no active problems to display for this patient.   History reviewed. No pertinent surgical history.  Prior to Admission medications   Medication Sig Start Date End Date Taking? Authorizing Provider  Prenatal Vit-Fe Fumarate-FA (MULTIVITAMIN-PRENATAL) 27-0.8 MG TABS tablet Take 1 tablet by mouth daily at 12 noon.    [provider]     Allergies Patient has no known allergies.  No family history on file.  Social History Social History   Tobacco Use  . Smoking status: Never Smoker  . Smokeless tobacco: Never Used  Substance Use Topics  . Alcohol use: No  . Drug use: Not on file    Review of Systems  Constitutional: No fever/chills Eyes: No visual changes.  ENT: No sore throat. Cardiovascular: Denies chest pain. Respiratory: Denies shortness of breath. Gastrointestinal: As above Genitourinary: Negative for dysuria.  Vaginal bleeding Musculoskeletal: Negative for back pain. Skin: Negative for rash. Neurological: Negative for headaches    ____________________________________________   PHYSICAL EXAM:  VITAL SIGNS: ED Triage Vitals  Enc Vitals Group     BP 07/22/18 0621 (!) 110/42     Pulse Rate 07/22/18 0621 85     Resp 07/22/18 0621 17     Temp 07/22/18 0621 98.6 F (37 C)   Temp Source 07/22/18 0621 Oral     SpO2 07/22/18 0621 100 %     Weight 07/22/18 0622 43 kg (94 lb 12.8 oz)     Height 07/22/18 0622 1.6 m (5\' 3" )     Head Circumference --      Peak Flow --      Pain Score 07/22/18 0622 5     Pain Loc --      Pain Edu? --      Excl. in GC? --     Constitutional: Alert and oriented.  Nose: No congestion/rhinnorhea. Mouth/Throat: Mucous membranes are moist.  Pharynx normal  Cardiovascular: Normal rate, regular rhythm.  Good peripheral circulation. Respiratory: Normal respiratory effort.  No retractions. Gastrointestinal: Soft and nontender. No distention.  No CVA tenderness.  Musculoskeletal: No lower extremity tenderness nor edema.  Warm and well perfused Neurologic:  Normal speech and language. No gross focal neurologic deficits are appreciated.  Skin:  Skin is warm, dry and intact. No rash noted. Psychiatric: Mood and affect are normal. Speech and behavior are normal.  ____________________________________________   LABS (all labs ordered are listed, but only abnormal results are displayed)  Labs Reviewed  CBC WITH DIFFERENTIAL/PLATELET - Abnormal; Notable for the following components:      Result Value   WBC 3.9 (*)    Hemoglobin 11.8 (*)    HCT 34.8 (*)    All other components within normal limits  HCG, QUANTITATIVE, PREGNANCY - Abnormal; Notable for the following components:  hCG, Beta Chain, Quant, S 82,495 (*)    All other components within normal limits  URINALYSIS, COMPLETE (UACMP) WITH MICROSCOPIC - Abnormal; Notable for the following components:   Color, Urine YELLOW (*)    APPearance HAZY (*)    Ketones, ur 80 (*)    All other components within normal limits  COMPREHENSIVE METABOLIC PANEL - Abnormal; Notable for the following components:   Sodium 134 (*)    Alkaline Phosphatase 30 (*)    Total Bilirubin 1.6 (*)    All other components within normal limits    ____________________________________________  EKG  None ____________________________________________  RADIOLOGY   ____________________________________________   PROCEDURES  Procedure(s) performed: No  Procedures   Critical Care performed: No ____________________________________________   INITIAL IMPRESSION / ASSESSMENT AND PLAN / ED COURSE  Pertinent labs & imaging results that were available during my care of the patient were reviewed by me and considered in my medical decision making (see chart for details).  Patient overall well-appearing and in no acute distress, vital signs reassuring, exam is benign.  We will give IV fluids, single dose of IV antiemetics  ----------------------------------------- 9:03 AM on 07/22/2018 -----------------------------------------  On reexam patient is feeling improved, appropriate for discharge with outpatient follow-up, counseled patient on small meals other methods of dealing with morning sickness    ____________________________________________   FINAL CLINICAL IMPRESSION(S) / ED DIAGNOSES  Final diagnoses:  Morning sickness        Note:  This document was prepared using Dragon voice recognition software and may include unintentional dictation errors.   Jene Every, MD 07/22/18 [phone]

## 2018-07-22 NOTE — Telephone Encounter (Signed)
Pt has NOB appointment 07/27/18 and can discuss medication at that time.

## 2018-07-22 NOTE — Telephone Encounter (Signed)
The patient states she is at [facility] now, just got her IV hooked up, and she states the nurses there told her to get a RX for Zoloft, and she is asking for her recent lab results, please advise, thanks.

## 2018-07-27 ENCOUNTER — Encounter: Payer: Self-pay | Admitting: Certified Nurse Midwife

## 2018-07-27 ENCOUNTER — Ambulatory Visit (INDEPENDENT_AMBULATORY_CARE_PROVIDER_SITE_OTHER): Payer: Medicaid Other | Admitting: Certified Nurse Midwife

## 2018-07-27 ENCOUNTER — Ambulatory Visit (INDEPENDENT_AMBULATORY_CARE_PROVIDER_SITE_OTHER): Payer: Medicaid Other

## 2018-07-27 VITALS — BP 124/84 | HR 112 | Wt 95.9 lb

## 2018-07-27 DIAGNOSIS — Z3A01 Less than 8 weeks gestation of pregnancy: Secondary | ICD-10-CM

## 2018-07-27 DIAGNOSIS — N8312 Corpus luteum cyst of left ovary: Secondary | ICD-10-CM

## 2018-07-27 DIAGNOSIS — Z3401 Encounter for supervision of normal first pregnancy, first trimester: Secondary | ICD-10-CM

## 2018-07-27 DIAGNOSIS — O26891 Other specified pregnancy related conditions, first trimester: Secondary | ICD-10-CM

## 2018-07-27 DIAGNOSIS — Z3490 Encounter for supervision of normal pregnancy, unspecified, unspecified trimester: Secondary | ICD-10-CM

## 2018-07-27 DIAGNOSIS — O3481 Maternal care for other abnormalities of pelvic organs, first trimester: Secondary | ICD-10-CM

## 2018-07-27 DIAGNOSIS — R109 Unspecified abdominal pain: Secondary | ICD-10-CM

## 2018-07-27 MED ORDER — DICLEGIS 10-10 MG PO TBEC: 1.0000 | DELAYED_RELEASE_TABLET | Freq: Two times a day (BID) | ORAL | 2 refills | Status: DC

## 2018-07-27 NOTE — Progress Notes (Signed)
GYN ENCOUNTER NOTE  Subjective:       Yolanda Garcia is a 23 y.o. G1P0 female here for pregnancy confirmation.   Reports nausea with daily vomiting. Uncertain about going forward with this pregnancy.   Denies difficulty breathing or respiratory distress, chest pain, abdominal pain, vaginal bleeding, dysuria, and leg pain or swelling.    Gynecologic History  Patient's last menstrual period was 06/02/2018 (exact date).  Contraception: none  Last Pap: N/A.   Obstetric History  OB History  Gravida Para Term Preterm AB Living  1            SAB TAB Ectopic Multiple Live Births               # Outcome Date GA Lbr Len/2nd Weight Sex Delivery Anes PTL Lv  1 Current             Past Medical History:  Diagnosis Date  . Panic attack     History reviewed. No pertinent surgical history.  Current Outpatient Medications on File Prior to Visit  Medication Sig Dispense Refill  . Prenatal Vit-Fe Fumarate-FA (MULTIVITAMIN-PRENATAL) 27-0.8 MG TABS tablet Take 1 tablet by mouth daily at 12 noon.     No current facility-administered medications on file prior to visit.     No Known Allergies  Social History   Socioeconomic History  . Marital status: Single    Spouse name: Not on file  . Number of children: Not on file  . Years of education: Not on file  . Highest education level: Not on file  Occupational History  . Not on file  Social Needs  . Financial resource strain: Not on file  . Food insecurity:    Worry: Not on file    Inability: Not on file  . Transportation needs:    Medical: Not on file    Non-medical: Not on file  Tobacco Use  . Smoking status: Never Smoker  . Smokeless tobacco: Never Used  Substance and Sexual Activity  . Alcohol use: No  . Drug use: Never  . Sexual activity: Yes  Lifestyle  . Physical activity:    Days per week: Not on file    Minutes per session: Not on file  . Stress: Not on file  Relationships  . Social connections:    Talks on  phone: Not on file    Gets together: Not on file    Attends religious service: Not on file    Active member of club or organization: Not on file    Attends meetings of clubs or organizations: Not on file    Relationship status: Not on file  . Intimate partner violence:    Fear of current or ex partner: Not on file    Emotionally abused: Not on file    Physically abused: Not on file    Forced sexual activity: Not on file  Other Topics Concern  . Not on file  Social History Narrative  . Not on file    History reviewed. No pertinent family history.  The following portions of the patient's history were reviewed and updated as appropriate: allergies, current medications, past family history, past medical history, past social history, past surgical history and problem list.  Review of Systems  ROS negative except as noted above. Information obtained from patient.   Objective:   BP 124/84   Pulse (!) 112   Wt 95 lb 14.4 oz (43.5 kg)   LMP 06/02/2018 (Exact Date)   BMI  16.99 kg/m    CONSTITUTIONAL: Well-developed, well-nourished female in no acute distress.   ULTRASOUND REPORT  [facility]: [facility] Date of Service: 07/27/2018   Indications:dating Findings:  Mason Jim intrauterine pregnancy is visualized with a CRL consistent with [redacted]w[redacted]d gestation, giving an (U/S) EDD of 03/12/19.   FHR: 150 BPM CRL measurement: 12.4 mm Yolk sac is visualized and appears normal and early anatomy is normal. Amnion: visualized and appears normal   Right Ovary is normal in appearance. Left Ovary is normal appearance. Corpus luteal cyst:  Left ovary Survey of the adnexa demonstrates no adnexal masses. There is no free peritoneal fluid in the cul de sac.  Impression: 1. [redacted]w[redacted]d Viable Singleton Intrauterine pregnancy by U/S. 2. (U/S) EDD is 03/12/19.  Recommendations: 1.Clinical correlation with the patient's History and Physical Exam.    Assessment:   1. Encounter for supervision  of normal first pregnancy in first trimester   Plan:   Ultrasound findings discussed with patient, verbalized understanding. Information given regarding pregnancy options.   Rx: Diclegis, see orders.   First trimester education; see AVS.   Reviewed red flag symptoms and when to call.   RTC x 2-3 for nurse intake. RTC x 4-5 weeks for NOB physical or sooner if needed.    Gunnar Bulla, CNM [facility], [facility] 07/27/18 3:56 PM

## 2018-07-27 NOTE — Progress Notes (Signed)
NOB- pt went to ER, hyperemesis, had [place] today, pt would like to discuss abortion, states "shes not sure what she wants to do"

## 2018-07-27 NOTE — Patient Instructions (Signed)
WE WOULD LOVE TO HEAR FROM YOU!!!!   Thank you Yolanda Garcia for visiting [facility].  Providing our patients with the best experience possible is really important to [place], and we hope that you felt that on your recent visit. The most valuable feedback we get comes from [place]!!    If you receive a survey please take a couple of minutes to let us know how we did.Thank you for continuing to trust [place] with your care.   [facility]   WHAT OB PATIENTS CAN EXPECT   Confirmation of pregnancy and ultrasound ordered if medically indicated-[redacted] weeks gestation  New OB (NOB) intake with nurse and New OB (NOB) labs- [redacted] weeks gestation  New OB (NOB) physical examination with provider- 11/[redacted] weeks gestation  Flu vaccine-[redacted] weeks gestation  Anatomy scan-[redacted] weeks gestation  Glucose tolerance test, blood work to test for anemia, T-dap vaccine-[redacted] weeks gestation  Vaginal swabs/cultures-STD/Group B strep-[redacted] weeks gestation  Appointments every 4 weeks until 28 weeks  Every 2 weeks from 28 weeks until 36 weeks  Weekly visits from 36 weeks until delivery    Common Medications Safe in Pregnancy  Acne:      Constipation:  Benzoyl Peroxide     Colace  Clindamycin      Dulcolax Suppository  Topica Erythromycin     Fibercon  Salicylic Acid      Metamucil         Miralax AVOID:        Senakot   Accutane    Cough:  Retin-A       Cough Drops  Tetracycline      Phenergan w/ Codeine if Rx  Minocycline      Robitussin (Plain & DM)  Antibiotics:     Crabs/Lice:  Ceclor       RID  Cephalosporins    AVOID:  E-Mycins      Kwell  Keflex  Macrobid/Macrodantin   Diarrhea:  Penicillin      Kao-Pectate  Zithromax      Imodium AD         PUSH FLUIDS AVOID:       Cipro     Fever:  Tetracycline      Tylenol (Regular or Extra  Minocycline       Strength)  Levaquin      Extra Strength-Do not          Exceed 8 tabs/24 hrs Caffeine:        <261m/day (equiv. To 1  cup of coffee or  approx. 3 12 oz sodas)         Gas: Cold/Hayfever:       Gas-X  Benadryl      Mylicon  Claritin       Phazyme  **Claritin-D        Chlor-Trimeton    Headaches:  Dimetapp      ASA-Free Excedrin  Drixoral-Non-Drowsy     Cold Compress  Mucinex (Guaifenasin)     Tylenol (Regular or Extra  Sudafed/Sudafed-12 Hour     Strength)  **Sudafed PE Pseudoephedrine   Tylenol Cold & Sinus     Vicks Vapor Rub  Zyrtec  **AVOID if Problems With Blood Pressure         Heartburn: Avoid lying down for at least 1 hour after meals  Aciphex      Maalox     Rash:  Milk of Magnesia     Benadryl    Mylanta  1% Hydrocortisone Cream  Pepcid  Pepcid Complete   Sleep Aids:  Prevacid      Ambien   Prilosec       Benadryl  Rolaids       Chamomile Tea  Tums (Limit 4/day)     Unisom  Zantac       Tylenol PM         Warm milk-add vanilla or  Hemorrhoids:       Sugar for taste  Anusol/Anusol H.C.  (RX: Analapram 2.5%)  Sugar Substitutes:  Hydrocortisone OTC     Ok in moderation  Preparation H      Tucks        Vaseline lotion applied to tissue with wiping    Herpes:     Throat:  Acyclovir      Oragel  Famvir  Valtrex     Vaccines:         Flu Shot Leg Cramps:       *Gardasil  Benadryl      Hepatitis A         Hepatitis B Nasal Spray:       Pneumovax  Saline Nasal Spray     Polio Booster         Tetanus Nausea:       Tuberculosis test or PPD  Vitamin B6 25 mg TID   AVOID:    Dramamine      *Gardasil  Emetrol       Live Poliovirus  Ginger Root 250 mg QID    MMR (measles, mumps &  High Complex Carbs @ Bedtime    rebella)  Sea Bands-Accupressure    Varicella (Chickenpox)  Unisom 1/2 tab TID     *No known complications           If received before Pain:         Known pregnancy;   Darvocet       Resume series after  Lortab        Delivery  Percocet    Yeast:   Tramadol      Femstat  Tylenol  3      Gyne-lotrimin  Ultram       Monistat  Vicodin           MISC:         All Sunscreens           Hair Coloring/highlights          Insect Repellant's          (Including DEET)         My[place] Tans Eating Plan for Pregnant Women While you are pregnant, your body requires additional nutrition to help support your growing baby. You also have a higher need for some vitamins and minerals, such as folic acid, calcium, iron, and vitamin D. Eating a healthy, well-balanced diet is very important for your health and your baby's health. Your need for extra calories varies for the three 47-monthsegments of your pregnancy (trimesters). For most women, it is recommended to consume:  150 extra calories a day during the first trimester.  300 extra calories a day during the second trimester.  300 extra calories a day during the third trimester. What are tips for following this plan?   Do not try to lose weight or go on a diet during pregnancy.  Limit your overall intake of foods that have "empty calories." These are foods that have little nutritional value, such as sweets, desserts,  candies, and sugar-sweetened beverages.  Eat a variety of foods (especially fruits and vegetables) to get a full range of vitamins and minerals.  Take a prenatal vitamin to help meet your additional vitamin and mineral needs during pregnancy, specifically for folic acid, iron, calcium, and vitamin D.  Remember to stay active. Ask your health care provider what types of exercise and activities are safe for you.  Practice good food safety and cleanliness. Wash your hands before you eat and after you prepare raw meat. Wash all fruits and vegetables well before peeling or eating. Taking these actions can help to prevent food-borne illnesses that can be very dangerous to your baby, such as listeriosis. Ask your health care provider for more information about listeriosis. What does 150 extra calories look like? Healthy  options that provide 150 extra calories each day could be any of the following:  6-8 oz (170-230 g) of plain low-fat yogurt with  cup of berries.  1 apple with 2 teaspoons (11 g) of peanut butter.  Cut-up vegetables with  cup (60 g) of hummus.  8 oz (230 mL) or 1 cup of low-fat chocolate milk.  1 stick of string cheese with 1 medium orange.  1 peanut butter and jelly sandwich that is made with one slice of whole-wheat bread and 1 tsp (5 g) of peanut butter. For 300 extra calories, you could eat two of those healthy options each day. What is a healthy amount of weight to gain? The right amount of weight gain for you is based on your BMI before you became pregnant. If your BMI:  Was less than 18 (underweight), you should gain 28-40 lb (13-18 kg).  Was 18-24.9 (normal), you should gain 25-35 lb (11-16 kg).  Was 25-29.9 (overweight), you should gain 15-25 lb (7-11 kg).  Was 30 or greater (obese), you should gain 11-20 lb (5-9 kg). What if I am having twins or multiples? Generally, if you are carrying twins or multiples:  You may need to eat 300-600 extra calories a day.  The recommended range for total weight gain is 25-54 lb (11-25 kg), depending on your BMI before pregnancy.  Talk with your health care provider to find out about nutritional needs, weight gain, and exercise that is right for you. What foods can I eat?  Grains All grains. Choose whole grains, such as whole-wheat bread, oatmeal, or brown rice. Vegetables All vegetables. Eat a variety of colors and types of vegetables. Remember to wash your vegetables well before peeling or eating. Fruits All fruits. Eat a variety of colors and types of fruit. Remember to wash your fruits well before peeling or eating. Meats and other protein foods Lean meats, including chicken, [place], fish, and lean cuts of beef, veal, or pork. If you eat fish or seafood, choose options that are higher in omega-3 fatty acids and lower in  mercury, such as salmon, herring, mussels, trout, sardines, pollock, shrimp, crab, and lobster. Tofu. Tempeh. Beans. Eggs. Peanut butter and other nut butters. Make sure that all meats, poultry, and eggs are cooked to food-safe temperatures or "well-done." Two or more servings of fish are recommended each week in order to get the most benefits from omega-3 fatty acids that are found in seafood. Choose fish that are lower in mercury. You can find more information online:  [place]Gaming.ch Dairy Pasteurized milk and milk alternatives (such as almond milk). Pasteurized yogurt and pasteurized cheese. Cottage cheese. Sour cream. Beverages Water. Juices that contain 100% fruit juice or vegetable  juice. Caffeine-free teas and decaffeinated coffee. Drinks that contain caffeine are okay to drink, but it is better to avoid caffeine. Keep your total caffeine intake to less than 200 mg each day (which is 12 oz or 355 mL of coffee, tea, or soda) or the limit as told by your health care provider. Fats and oils Fats and oils are okay to include in moderation. Sweets and desserts Sweets and desserts are okay to include in moderation. Seasoning and other foods All pasteurized condiments. The items listed above may not be a complete list of recommended foods and beverages. Contact your dietitian for more options. What foods are not recommended? Vegetables Raw (unpasteurized) vegetable juices. Fruits Unpasteurized fruit juices. Meats and other protein foods Lunch meats, bologna, hot dogs, or other deli meats. (If you must eat those meats, reheat them until they are steaming hot.) Refrigerated pat, meat spreads from a meat counter, smoked seafood that is found in the refrigerated section of a store. Raw or undercooked meats, poultry, and eggs. Raw fish, such as sushi or sashimi. Fish that have high mercury content, such as tilefish, shark, swordfish, and king mackerel. To learn more about mercury in fish, talk with  your health care provider or look for online resources, such as:  [place]Gaming.ch Dairy Raw (unpasteurized) milk and any foods that have raw milk in them. Soft cheeses, such as feta, queso blanco, queso fresco, Brie, Camembert cheeses, blue-veined cheeses, and Panela cheese (unless it is made with pasteurized milk, which must be stated on the label). Beverages Alcohol. Sugar-sweetened beverages, such as sodas, teas, or energy drinks. Seasoning and other foods Homemade fermented foods and drinks, such as pickles, sauerkraut, or kombucha drinks. (Store-bought pasteurized versions of these are okay.) Salads that are made in a store or deli, such as ham salad, chicken salad, egg salad, tuna salad, and seafood salad. The items listed above may not be a complete list of foods and beverages to avoid. Contact your dietitian for more information. Where to find more information To calculate the number of calories you need based on your height, weight, and activity level, you can use an online calculator such as:  MobileTransition.ch To calculate how much weight you should gain during pregnancy, you can use an online pregnancy weight gain calculator such as:  StreamingFood.com.cy Summary  While you are pregnant, your body requires additional nutrition to help support your growing baby.  Eat a variety of foods, especially fruits and vegetables to get a full range of vitamins and minerals.  Practice good food safety and cleanliness. Wash your hands before you eat and after you prepare raw meat. Wash all fruits and vegetables well before peeling or eating. Taking these actions can help to prevent food-borne illnesses, such as listeriosis, that can be very dangerous to your baby.  Do not eat raw meat or fish. Do not eat fish that have high mercury content, such as tilefish, shark, swordfish, and king mackerel. Do not eat unpasteurized (raw) dairy.  Take a  prenatal vitamin to help meet your additional vitamin and mineral needs during pregnancy, specifically for folic acid, iron, calcium, and vitamin D. This information is not intended to replace advice given to you by your health care provider. Make sure you discuss any questions you have with your health care provider. Document Released: 03/03/2014 Document Revised: 02/13/2017 Document Reviewed: 02/13/2017 Elsevier Interactive Patient Education  2019 [place] of Pregnancy  The first trimester of pregnancy is from week 1 until the end  of week 13 (months 1 through 3). During this time, your baby will begin to develop inside you. At 6-8 weeks, the eyes and face are formed, and the heartbeat can be seen on ultrasound. At the end of 12 weeks, all the baby's organs are formed. Prenatal care is all the medical care you receive before the birth of your baby. Make sure you get good prenatal care and follow all of your doctor's instructions. Follow these instructions at home: Medicines  Take over-the-counter and prescription medicines only as told by your doctor. Some medicines are safe and some medicines are not safe during pregnancy.  Take a prenatal vitamin that contains at least 600 micrograms (mcg) of folic acid.  If you have trouble pooping (constipation), take medicine that will make your stool soft (stool softener) if your doctor approves. Eating and drinking   Eat regular, healthy meals.  Your doctor will tell you the amount of weight gain that is right for you.  Avoid raw meat and uncooked cheese.  If you feel sick to your stomach (nauseous) or throw up (vomit): ? Eat 4 or 5 small meals a day instead of 3 large meals. ? Try eating a few soda crackers. ? Drink liquids between meals instead of during meals.  To prevent constipation: ? Eat foods that are high in fiber, like fresh fruits and vegetables, whole grains, and beans. ? Drink enough fluids to keep your pee  (urine) clear or pale yellow. Activity  Exercise only as told by your doctor. Stop exercising if you have cramps or pain in your lower belly (abdomen) or low back.  Do not exercise if it is too hot, too humid, or if you are in a place of great height (high altitude).  Try to avoid standing for long periods of time. Move your legs often if you must stand in one place for a long time.  Avoid heavy lifting.  Wear low-heeled shoes. Sit and stand up straight.  You can have sex unless your doctor tells you not to. Relieving pain and discomfort  Wear a good support bra if your breasts are sore.  Take warm water baths (sitz baths) to soothe pain or discomfort caused by hemorrhoids. Use hemorrhoid cream if your doctor says it is okay.  Rest with your legs raised if you have leg cramps or low back pain.  If you have puffy, bulging veins (varicose veins) in your legs: ? Wear support hose or compression stockings as told by your doctor. ? Raise (elevate) your feet for 15 minutes, 3-4 times a day. ? Limit salt in your food. Prenatal care  Schedule your prenatal visits by the twelfth week of pregnancy.  Write down your questions. Take them to your prenatal visits.  Keep all your prenatal visits as told by your doctor. This is important. Safety  Wear your seat belt at all times when driving.  Make a list of emergency phone numbers. The list should include numbers for family, friends, the hospital, and police and fire departments. General instructions  Ask your doctor for a referral to a local prenatal class. Begin classes no later than at the start of month 6 of your pregnancy.  Ask for help if you need counseling or if you need help with nutrition. Your doctor can give you advice or tell you where to go for help.  Do not use hot tubs, steam rooms, or saunas.  Do not douche or use tampons or scented sanitary pads.  Do not  cross your legs for long periods of time.  Avoid all herbs  and alcohol. Avoid drugs that are not approved by your doctor.  Do not use any tobacco products, including cigarettes, chewing tobacco, and electronic cigarettes. If you need help quitting, ask your doctor. You may get counseling or other support to help you quit.  Avoid cat litter boxes and soil used by cats. These carry germs that can cause birth defects in the baby and can cause a loss of your baby (miscarriage) or stillbirth.  Visit your dentist. At home, brush your teeth with a soft toothbrush. Be gentle when you floss. Contact a doctor if:  You are dizzy.  You have mild cramps or pressure in your lower belly.  You have a nagging pain in your belly area.  You continue to feel sick to your stomach, you throw up, or you have watery poop (diarrhea).  You have a bad smelling fluid coming from your vagina.  You have pain when you pee (urinate).  You have increased puffiness (swelling) in your face, hands, legs, or ankles. Get help right away if:  You have a fever.  You are leaking fluid from your vagina.  You have spotting or bleeding from your vagina.  You have very bad belly cramping or pain.  You gain or lose weight rapidly.  You throw up blood. It may look like coffee grounds.  You are around people who have [place] measles, fifth disease, or chickenpox.  You have a very bad headache.  You have shortness of breath.  You have any kind of trauma, such as from a fall or a car accident. Summary  The first trimester of pregnancy is from week 1 until the end of week 13 (months 1 through 3).  To take care of yourself and your unborn baby, you will need to eat healthy meals, take medicines only if your doctor tells you to do so, and do activities that are safe for you and your baby.  Keep all follow-up visits as told by your doctor. This is important as your doctor will have to ensure that your baby is healthy and growing well. This information is not intended to replace  advice given to you by your health care provider. Make sure you discuss any questions you have with your health care provider. Document Released: 11/05/2007 Document Revised: 05/27/2016 Document Reviewed: 05/27/2016 Elsevier Interactive Patient Education  2019 [employer].

## 2018-08-16 ENCOUNTER — Other Ambulatory Visit: Payer: Self-pay

## 2018-08-16 ENCOUNTER — Ambulatory Visit (INDEPENDENT_AMBULATORY_CARE_PROVIDER_SITE_OTHER): Payer: Medicaid Other | Admitting: Certified Nurse Midwife

## 2018-08-16 VITALS — BP 92/61 | HR 92 | Ht 63.0 in | Wt 94.3 lb

## 2018-08-16 DIAGNOSIS — Z3401 Encounter for supervision of normal first pregnancy, first trimester: Secondary | ICD-10-CM

## 2018-08-16 DIAGNOSIS — Z0283 Encounter for blood-alcohol and blood-drug test: Secondary | ICD-10-CM

## 2018-08-16 DIAGNOSIS — Z113 Encounter for screening for infections with a predominantly sexual mode of transmission: Secondary | ICD-10-CM

## 2018-08-16 LAB — OB RESULTS CONSOLE VARICELLA ZOSTER ANTIBODY, IGG: Varicella: NON-IMMUNE/NOT IMMUNE

## 2018-08-16 NOTE — Progress Notes (Signed)
      Yolanda Garcia presents for NOB nurse intake visit. Pregnancy confirmation done at Emergency Dept., 07/06/18, with Willy Eddy.  G1.  P0.  LMP 06/02/18.  EDD 03/12/19.  Ga [redacted]w[redacted]d. Pregnancy education material explained and given.  0 cats in the home.  NOB labs ordered. BMI less than 30. TSH/HbgA1 not ordered. Sickle cell order due to race. HIV and drug screen explained and ordered. Genetic screening discussed. Genetic testing; Unsure. Pt to discuss genetic testing with provider. PNV encouraged. Pt to follow up with provider in 2 weeks for NOB physical.  [facility] Financial Policy and FMLA forms explained and signed. Pt stated taht she understood all information given. Pt stated that she is not taking her prenatal vitamins due to them making her sick on the stomach.  Prenatal vitamin samples given: vitalfol gummies, vitafol nano, prenate mini, prenate pixie and vitafol strips   BP 92/61   Pulse 92   Ht 5\' 3"  (1.6 m)   Wt 94 lb 4.8 oz (42.8 kg)   LMP 06/02/2018 (Exact Date)   BMI 16.70 kg/m

## 2018-08-16 NOTE — Patient Instructions (Signed)
WHAT OB PATIENTS CAN EXPECT   Confirmation of pregnancy and ultrasound ordered if medically indicated-[redacted] weeks gestation  New OB (NOB) intake with nurse and New OB (NOB) labs- [redacted] weeks gestation  New OB (NOB) physical examination with provider- 11/[redacted] weeks gestation  Flu vaccine-[redacted] weeks gestation  Anatomy scan-[redacted] weeks gestation  Glucose tolerance test, blood work to test for anemia, T-dap vaccine-[redacted] weeks gestation  Vaginal swabs/cultures-STD/Group B strep-[redacted] weeks gestation  Appointments every 4 weeks until 28 weeks  Every 2 weeks from 28 weeks until 36 weeks  Weekly visits from 36 weeks until delivery  Prenatal Care Prenatal care is health care during pregnancy. It helps you and your unborn baby (fetus) stay as healthy as possible. Prenatal care may be provided by a midwife, a family practice health care provider, or a childbirth and pregnancy specialist (obstetrician). How does this affect me? During pregnancy, you will be closely monitored for any new conditions that might develop. To lower your risk of pregnancy complications, you and your health care provider will talk about any underlying conditions you have. How does this affect my baby? Early and consistent prenatal care increases the chance that your baby will be healthy during pregnancy. Prenatal care lowers the risk that your baby will be:  Born early (prematurely).  Smaller than expected at birth (small for gestational age). What can I expect at the first prenatal care visit? Your first prenatal care visit will likely be the longest. You should schedule your first prenatal care visit as soon as you know that you are pregnant. Your first visit is a good time to talk about any questions or concerns you have about pregnancy. At your visit, you and your health care provider will talk about:  Your medical history, including: ? Any past pregnancies. ? Your family's medical history. ? The baby's father's medical  history. ? Any long-term (chronic) health conditions you have and how you manage them. ? Any surgeries or procedures you have had. ? Any current over-the-counter or prescription medicines, herbs, or supplements you are taking.  Other factors that could pose a risk to your baby, including:  Your home setting and your stress levels, including: ? Exposure to abuse or violence. ? Household financial strain. ? Mental health conditions you have.  Your daily health habits, including diet and exercise. Your health care provider will also:  Measure your weight, height, and blood pressure.  Do a physical exam, including a pelvic and breast exam.  Perform blood tests and urine tests to check for: ? Urinary tract infection. ? Sexually transmitted infections (STIs). ? Low iron levels in your blood (anemia). ? Blood type and certain proteins on red blood cells (Rh antibodies). ? Infections and immunity to viruses, such as hepatitis B and rubella. ? HIV (human immunodeficiency virus).  Do an ultrasound to confirm your baby's growth and development and to help predict your estimated due date (EDD). This ultrasound is done with a probe that is inserted into the vagina (transvaginal ultrasound).  Discuss your options for genetic screening.  Give you information about how to keep yourself and your baby healthy, including: ? Nutrition and taking vitamins. ? Physical activity. ? How to manage pregnancy symptoms such as nausea and vomiting (morning sickness). ? Infections and substances that may be harmful to your baby and how to avoid them. ? Food safety. ? Dental care. ? Working. ? Travel. ? Warning signs to watch for and when to call your health care provider. How often will  I have prenatal care visits? After your first prenatal care visit, you will have regular visits throughout your pregnancy. The visit schedule is often as follows:  Up to week 28 of pregnancy: once every 4 weeks.  28-36  weeks: once every 2 weeks.  After 36 weeks: every week until delivery. Some women may have visits more or less often depending on any underlying health conditions and the health of the baby. Keep all follow-up and prenatal care visits as told by your health care provider. This is important. What happens during routine prenatal care visits? Your health care provider will:  Measure your weight and blood pressure.  Check for fetal heart sounds.  Measure the height of your uterus in your abdomen (fundal height). This may be measured starting around week 20 of pregnancy.  Check the position of your baby inside your uterus.  Ask questions about your diet, sleeping patterns, and whether you can feel the baby move.  Review warning signs to watch for and signs of labor.  Ask about any pregnancy symptoms you are having and how you are dealing with them. Symptoms may include: ? Headaches. ? Nausea and vomiting. ? Vaginal discharge. ? Swelling. ? Fatigue. ? Constipation. ? Any discomfort, including back or pelvic pain. Make a list of questions to ask your health care provider at your routine visits. What tests might I have during prenatal care visits? You may have blood, urine, and imaging tests throughout your pregnancy, such as:  Urine tests to check for glucose, protein, or signs of infection.  Glucose tests to check for a form of diabetes that can develop during pregnancy (gestational diabetes mellitus). This is usually done around week 24 of pregnancy.  An ultrasound to check your baby's growth and development and to check for birth defects. This is usually done around week 20 of pregnancy.  A test to check for group B strep (GBS) infection. This is usually done around week 36 of pregnancy.  Genetic testing. This may include blood or imaging tests, such as an ultrasound. Some genetic tests are done during the first trimester and some are done during the second trimester. What else  can I expect during prenatal care visits? Your health care provider may recommend getting certain vaccines during pregnancy. These may include:  A yearly flu shot (annual influenza vaccine). This is especially important if you will be pregnant during flu season.  Tdap (tetanus, diphtheria, pertussis) vaccine. Getting this vaccine during pregnancy can protect your baby from whooping cough (pertussis) after birth. This vaccine may be recommended between weeks 27 and 36 of pregnancy. Later in your pregnancy, your health care provider may give you information about:  Childbirth and breastfeeding classes.  Choosing a health care provider for your baby.  Umbilical cord banking.  Breastfeeding.  Birth control after your baby is born.  The hospital labor and delivery unit and how to tour it.  Registering at the hospital before you go into labor. Where to find more information  Office on Women's Health: LegalWarrants.gl  American Pregnancy Association: americanpregnancy.org  March of Dimes: marchofdimes.org Summary  Prenatal care helps you and your baby stay as healthy as possible during pregnancy.  Your first prenatal care visit will most likely be the longest.  You will have visits and tests throughout your pregnancy to monitor your health and your baby's health.  Bring a list of questions to your visits to ask your health care provider.  Make sure to keep all follow-up and prenatal  care visits with your health care provider. This information is not intended to replace advice given to you by your health care provider. Make sure you discuss any questions you have with your health care provider. Document Released: 05/22/2003 Document Revised: 05/18/2017 Document Reviewed: 05/18/2017 Elsevier Interactive Patient Education  2019 [employer] Morning Sickness  Morning sickness is when you feel sick to your stomach (nauseous) during pregnancy. You may feel sick to your stomach and  throw up (vomit). You may feel sick in the morning, but you can feel this way at any time of day. Some women feel very sick to their stomach and cannot stop throwing up (hyperemesis gravidarum). Follow these instructions at home: Medicines  Take over-the-counter and prescription medicines only as told by your doctor. Do not take any medicines until you talk with your doctor about them first.  Taking multivitamins before getting pregnant can stop or lessen the harshness of morning sickness. Eating and drinking  Eat dry toast or crackers before getting out of bed.  Eat 5 or 6 small meals a day.  Eat dry and bland foods like rice and baked potatoes.  Do not eat greasy, fatty, or spicy foods.  Have someone cook for you if the smell of food causes you to feel sick or throw up.  If you feel sick to your stomach after taking prenatal vitamins, take them at night or with a snack.  Eat protein when you need a snack. Nuts, yogurt, and cheese are good choices.  Drink fluids throughout the day.  Try ginger ale made with real ginger, ginger tea made from fresh grated ginger, or ginger candies. General instructions  Do not use any products that have nicotine or tobacco in them, such as cigarettes and e-cigarettes. If you need help quitting, ask your doctor.  Use an air purifier to keep the air in your house free of smells.  Get lots of fresh air.  Try to avoid smells that make you feel sick.  Try: ? Wearing a bracelet that is used for seasickness (acupressure wristband). ? Going to a doctor who puts thin needles into certain body points (acupuncture) to improve how you feel. Contact a doctor if:  You need medicine to feel better.  You feel dizzy or light-headed.  You are losing weight. Get help right away if:  You feel very sick to your stomach and cannot stop throwing up.  You pass out (faint).  You have very bad pain in your belly. Summary  Morning sickness is when you feel  sick to your stomach (nauseous) during pregnancy.  You may feel sick in the morning, but you can feel this way at any time of day.  Making some changes to what you eat may help your symptoms go away. This information is not intended to replace advice given to you by your health care provider. Make sure you discuss any questions you have with your health care provider. Document Released: 06/26/2004 Document Revised: 06/19/2016 Document Reviewed: 06/19/2016 Elsevier Interactive Patient Education  2019 [employer]. How a Baby Grows During Pregnancy  Pregnancy begins when a female's sperm enters a female's egg (fertilization). Fertilization usually happens in one of the tubes (fallopian tubes) that connect the ovaries to the womb (uterus). The fertilized egg moves down the fallopian tube to the uterus. Once it reaches the uterus, it implants into the lining of the uterus and begins to grow. For the first 10 weeks, the fertilized egg is called an embryo. After 10  weeks, it is called a fetus. As the fetus continues to grow, it receives oxygen and nutrients through tissue (placenta) that grows to support the developing baby. The placenta is the life support system for the baby. It provides oxygen and nutrition and removes waste. Learning as much as you can about your pregnancy and how your baby is developing can help you enjoy the experience. It can also make you aware of when there might be a problem and when to ask questions. How long does a typical pregnancy last? A pregnancy usually lasts 280 days, or about 40 weeks. Pregnancy is divided into three periods of growth, also called trimesters:  First trimester: 0-12 weeks.  Second trimester: 13-27 weeks.  Third trimester: 28-40 weeks. The day when your baby is ready to be born (full term) is your estimated date of delivery. How does my baby develop month by month? First month  The fertilized egg attaches to the inside of the uterus.  Some cells  will form the placenta. Others will form the fetus.  The arms, legs, brain, spinal cord, lungs, and heart begin to develop.  At the end of the first month, the heart begins to beat. Second month  The bones, inner ear, eyelids, hands, and feet form.  The genitals develop.  By the end of 8 weeks, all major organs are developing. Third month  All of the internal organs are forming.  Teeth develop below the gums.  Bones and muscles begin to grow. The spine can flex.  The skin is transparent.  Fingernails and toenails begin to form.  Arms and legs continue to grow longer, and hands and feet develop.  The fetus is about 3 inches (7.6 cm) long. Fourth month  The placenta is completely formed.  The external sex organs, neck, outer ear, eyebrows, eyelids, and fingernails are formed.  The fetus can hear, swallow, and move its arms and legs.  The kidneys begin to produce urine.  The skin is covered with a white, waxy coating (vernix) and very fine hair (lanugo). Fifth month  The fetus moves around more and can be felt for the first time (quickening).  The fetus starts to sleep and wake up and may begin to suck its finger.  The nails grow to the end of the fingers.  The organ in the digestive system that makes bile (gallbladder) functions and helps to digest nutrients.  If your baby is a girl, eggs are present in her ovaries. If your baby is a boy, testicles start to move down into his scrotum. Sixth month  The lungs are formed.  The eyes open. The brain continues to develop.  Your baby has fingerprints and toe prints. Your baby's hair grows thicker.  At the end of the second trimester, the fetus is about 9 inches (22.9 cm) long. Seventh month  The fetus kicks and stretches.  The eyes are developed enough to sense changes in light.  The hands can make a grasping motion.  The fetus responds to sound. Eighth month  All organs and body systems are fully developed  and functioning.  Bones harden, and taste buds develop. The fetus may hiccup.  Certain areas of the brain are still developing. The skull remains soft. Ninth month  The fetus gains about  lb (0.23 kg) each week.  The lungs are fully developed.  Patterns of sleep develop.  The fetus's head typically moves into a head-down position (vertex) in the uterus to prepare for birth.  The  fetus weighs 6-9 lb (2.72-4.08 kg) and is 19-20 inches (48.26-50.8 cm) long. What can I do to have a healthy pregnancy and help my baby develop? General instructions  Take prenatal vitamins as directed by your health care provider. These include vitamins such as folic acid, iron, calcium, and vitamin D. They are important for healthy development.  Take medicines only as directed by your health care provider. Read labels and ask a pharmacist or your health care provider whether over-the-counter medicines, supplements, and prescription drugs are safe to take during pregnancy.  Keep all follow-up visits as directed by your health care provider. This is important. Follow-up visits include prenatal care and screening tests. How do I know if my baby is developing well? At each prenatal visit, your health care provider will do several different tests to check on your health and keep track of your baby's development. These include:  Fundal height and position. ? Your health care provider will measure your growing belly from your pubic bone to the top of the uterus using a tape measure. ? Your health care provider will also feel your belly to determine your baby's position.  Heartbeat. ? An ultrasound in the first trimester can confirm pregnancy and show a heartbeat, depending on how far along you are. ? Your health care provider will check your baby's heart rate at every prenatal visit.  Second trimester ultrasound. ? This ultrasound checks your baby's development. It also may show your baby's gender. What  should I do if I have concerns about my baby's development? Always talk with your health care provider about any concerns that you may have about your pregnancy and your baby. Summary  A pregnancy usually lasts 280 days, or about 40 weeks. Pregnancy is divided into three periods of growth, also called trimesters.  Your health care provider will monitor your baby's growth and development throughout your pregnancy.  Follow your health care provider's recommendations about taking prenatal vitamins and medicines during your pregnancy.  Talk with your health care provider if you have any concerns about your pregnancy or your developing baby. This information is not intended to replace advice given to you by your health care provider. Make sure you discuss any questions you have with your health care provider. Document Released: 11/05/2007 Document Revised: 04/01/2017 Document Reviewed: 04/01/2017 Elsevier Interactive Patient Education  2019 [place] of Pregnancy  The first trimester of pregnancy is from week 1 until the end of week 13 (months 1 through 3). During this time, your baby will begin to develop inside you. At 6-8 weeks, the eyes and face are formed, and the heartbeat can be seen on ultrasound. At the end of 12 weeks, all the baby's organs are formed. Prenatal care is all the medical care you receive before the birth of your baby. Make sure you get good prenatal care and follow all of your doctor's instructions. Follow these instructions at home: Medicines  Take over-the-counter and prescription medicines only as told by your doctor. Some medicines are safe and some medicines are not safe during pregnancy.  Take a prenatal vitamin that contains at least 600 micrograms (mcg) of folic acid.  If you have trouble pooping (constipation), take medicine that will make your stool soft (stool softener) if your doctor approves. Eating and drinking   Eat regular, healthy  meals.  Your doctor will tell you the amount of weight gain that is right for you.  Avoid raw meat and uncooked cheese.  If you  feel sick to your stomach (nauseous) or throw up (vomit): ? Eat 4 or 5 small meals a day instead of 3 large meals. ? Try eating a few soda crackers. ? Drink liquids between meals instead of during meals.  To prevent constipation: ? Eat foods that are high in fiber, like fresh fruits and vegetables, whole grains, and beans. ? Drink enough fluids to keep your pee (urine) clear or pale yellow. Activity  Exercise only as told by your doctor. Stop exercising if you have cramps or pain in your lower belly (abdomen) or low back.  Do not exercise if it is too hot, too humid, or if you are in a place of great height (high altitude).  Try to avoid standing for long periods of time. Move your legs often if you must stand in one place for a long time.  Avoid heavy lifting.  Wear low-heeled shoes. Sit and stand up straight.  You can have sex unless your doctor tells you not to. Relieving pain and discomfort  Wear a good support bra if your breasts are sore.  Take warm water baths (sitz baths) to soothe pain or discomfort caused by hemorrhoids. Use hemorrhoid cream if your doctor says it is okay.  Rest with your legs raised if you have leg cramps or low back pain.  If you have puffy, bulging veins (varicose veins) in your legs: ? Wear support hose or compression stockings as told by your doctor. ? Raise (elevate) your feet for 15 minutes, 3-4 times a day. ? Limit salt in your food. Prenatal care  Schedule your prenatal visits by the twelfth week of pregnancy.  Write down your questions. Take them to your prenatal visits.  Keep all your prenatal visits as told by your doctor. This is important. Safety  Wear your seat belt at all times when driving.  Make a list of emergency phone numbers. The list should include numbers for family, friends, the hospital,  and police and fire departments. General instructions  Ask your doctor for a referral to a local prenatal class. Begin classes no later than at the start of month 6 of your pregnancy.  Ask for help if you need counseling or if you need help with nutrition. Your doctor can give you advice or tell you where to go for help.  Do not use hot tubs, steam rooms, or saunas.  Do not douche or use tampons or scented sanitary pads.  Do not cross your legs for long periods of time.  Avoid all herbs and alcohol. Avoid drugs that are not approved by your doctor.  Do not use any tobacco products, including cigarettes, chewing tobacco, and electronic cigarettes. If you need help quitting, ask your doctor. You may get counseling or other support to help you quit.  Avoid cat litter boxes and soil used by cats. These carry germs that can cause birth defects in the baby and can cause a loss of your baby (miscarriage) or stillbirth.  Visit your dentist. At home, brush your teeth with a soft toothbrush. Be gentle when you floss. Contact a doctor if:  You are dizzy.  You have mild cramps or pressure in your lower belly.  You have a nagging pain in your belly area.  You continue to feel sick to your stomach, you throw up, or you have watery poop (diarrhea).  You have a bad smelling fluid coming from your vagina.  You have pain when you pee (urinate).  You have increased puffiness (  swelling) in your face, hands, legs, or ankles. Get help right away if:  You have a fever.  You are leaking fluid from your vagina.  You have spotting or bleeding from your vagina.  You have very bad belly cramping or pain.  You gain or lose weight rapidly.  You throw up blood. It may look like coffee grounds.  You are around people who have [place] measles, fifth disease, or chickenpox.  You have a very bad headache.  You have shortness of breath.  You have any kind of trauma, such as from a fall or a car  accident. Summary  The first trimester of pregnancy is from week 1 until the end of week 13 (months 1 through 3).  To take care of yourself and your unborn baby, you will need to eat healthy meals, take medicines only if your doctor tells you to do so, and do activities that are safe for you and your baby.  Keep all follow-up visits as told by your doctor. This is important as your doctor will have to ensure that your baby is healthy and growing well. This information is not intended to replace advice given to you by your health care provider. Make sure you discuss any questions you have with your health care provider. Document Released: 11/05/2007 Document Revised: 05/27/2016 Document Reviewed: 05/27/2016 Elsevier Interactive Patient Education  2019 [employer]. Commonly Asked Questions During Pregnancy  Cats: A parasite can be excreted in cat feces.  To avoid exposure you need to have another person empty the little box.  If you must empty the litter box you will need to wear gloves.  Wash your hands after handling your cat.  This parasite can also be found in raw or undercooked meat so this should also be avoided.  Colds, Sore Throats, Flu: Please check your medication sheet to see what you can take for symptoms.  If your symptoms are unrelieved by these medications please call the office.  Dental Work: Most any dental work [employer] recommends is permitted.  X-rays should only be taken during the first trimester if absolutely necessary.  Your abdomen should be shielded with a lead apron during all x-rays.  Please notify your provider prior to receiving any x-rays.  Novocaine is fine; gas is not recommended.  If your dentist requires a note from [place] prior to dental work please call the office and we will provide one for you.  Exercise: Exercise is an important part of staying healthy during your pregnancy.  You may continue most exercises you were accustomed to prior to pregnancy.  Later in  your pregnancy you will most likely notice you have difficulty with activities requiring balance like riding a bicycle.  It is important that you listen to your body and avoid activities that put you at a higher risk of falling.  Adequate rest and staying well hydrated are a must!  If you have questions about the safety of specific activities ask your provider.    Exposure to Children with illness: Try to avoid obvious exposure; report any symptoms to [place] when noted,  If you have chicken pos, red measles or mumps, you should be immune to these diseases.   Please do not take any vaccines while pregnant unless you have checked with your OB provider.  Fetal Movement: After 28 weeks we recommend you do "kick counts" twice daily.  Lie or sit down in a calm quiet environment and count your baby movements "kicks".  You  should feel your baby at least 10 times per hour.  If you have not felt 10 kicks within the first hour get up, walk around and have something sweet to eat or drink then repeat for an additional hour.  If count remains less than 10 per hour notify your provider.  Fumigating: Follow your pest control agent's advice as to how long to stay out of your home.  Ventilate the area well before re-entering.  Hemorrhoids:   Most over-the-counter preparations can be used during pregnancy.  Check your medication to see what is safe to use.  It is important to use a stool softener or fiber in your diet and to drink lots of liquids.  If hemorrhoids seem to be getting worse please call the office.   Hot Tubs:  Hot tubs Jacuzzis and saunas are not recommended while pregnant.  These increase your internal body temperature and should be avoided.  Intercourse:  Sexual intercourse is safe during pregnancy as long as you are comfortable, unless otherwise advised by your provider.  Spotting may occur after intercourse; report any bright red bleeding that is heavier than spotting.  Labor:  If you know that you are in  labor, please go to the hospital.  If you are unsure, please call the office and let us help you decide what to do.  Lifting, straining, etc:  If your job requires heavy lifting or straining please check with your provider for any limitations.  Generally, you should not lift items heavier than that you can lift simply with your hands and arms (no back muscles)  Painting:  Paint fumes do not harm your pregnancy, but may make you ill and should be avoided if possible.  Latex or water based paints have less odor than oils.  Use adequate ventilation while painting.  Permanents & Hair Color:  Chemicals in hair dyes are not recommended as they cause increase hair dryness which can increase hair loss during pregnancy.  " Highlighting" and permanents are allowed.  Dye may be absorbed differently and permanents may not hold as well during pregnancy.  Sunbathing:  Use a sunscreen, as skin burns easily during pregnancy.  Drink plenty of fluids; avoid over heating.  Tanning Beds:  Because their possible side effects are still unknown, tanning beds are not recommended.  Ultrasound Scans:  Routine ultrasounds are performed at approximately 20 weeks.  You will be able to see your baby's general anatomy an if you would like to know the gender this can usually be determined as well.  If it is questionable when you conceived you may also receive an ultrasound early in your pregnancy for dating purposes.  Otherwise ultrasound exams are not routinely performed unless there is a medical necessity.  Although you can request a scan we ask that you pay for it when conducted because insurance does not cover " patient request" scans.  Work: If your pregnancy proceeds without complications you may work until your due date, unless your physician or employer advises otherwise.  Round Ligament Pain/Pelvic Discomfort:  Sharp, shooting pains not associated with bleeding are fairly common, usually occurring in the second trimester  of pregnancy.  They tend to be worse when standing up or when you remain standing for long periods of time.  These are the result of pressure of certain pelvic ligaments called "round ligaments".  Rest, Tylenol and heat seem to be the most effective relief.  As the womb and fetus grow, they rise out of the pelvis  and the discomfort improves.  Please notify the office if your pain seems different than that described.  It may represent a more serious condition.  Common Medications Safe in Pregnancy  Acne:      Constipation:  Benzoyl Peroxide     Colace  Clindamycin      Dulcolax Suppository  Topica Erythromycin     Fibercon  Salicylic Acid      Metamucil         Miralax AVOID:        Senakot   Accutane    Cough:  Retin-A       Cough Drops  Tetracycline      Phenergan w/ Codeine if Rx  Minocycline      Robitussin (Plain & DM)  Antibiotics:     Crabs/Lice:  Ceclor       RID  Cephalosporins    AVOID:  E-Mycins      Kwell  Keflex  Macrobid/Macrodantin   Diarrhea:  Penicillin      Kao-Pectate  Zithromax      Imodium AD         PUSH FLUIDS AVOID:       Cipro     Fever:  Tetracycline      Tylenol (Regular or Extra  Minocycline       Strength)  Levaquin      Extra Strength-Do not          Exceed 8 tabs/24 hrs Caffeine:        '200mg'$ /day (equiv. To 1 cup of coffee or  approx. 3 12 oz sodas)         Gas: Cold/Hayfever:       Gas-X  Benadryl      Mylicon  Claritin       Phazyme  **Claritin-D        Chlor-Trimeton    Headaches:  Dimetapp      ASA-Free Excedrin  Drixoral-Non-Drowsy     Cold Compress  Mucinex (Guaifenasin)     Tylenol (Regular or Extra  Sudafed/Sudafed-12 Hour     Strength)  **Sudafed PE Pseudoephedrine   Tylenol Cold & Sinus     Vicks Vapor Rub  Zyrtec  **AVOID if Problems With Blood Pressure         Heartburn: Avoid lying down for at least 1 hour after meals  Aciphex      Maalox     Rash:  Milk of Magnesia     Benadryl    Mylanta       1% Hydrocortisone  Cream  Pepcid  Pepcid Complete   Sleep Aids:  Prevacid      Ambien   Prilosec       Benadryl  Rolaids       Chamomile Tea  Tums (Limit 4/day)     Unisom  Zantac       Tylenol PM         Warm milk-add vanilla or  Hemorrhoids:       Sugar for taste  Anusol/Anusol H.C.  (RX: Analapram 2.5%)  Sugar Substitutes:  Hydrocortisone OTC     Ok in moderation  Preparation H      Tucks        Vaseline lotion applied to tissue with wiping    Herpes:     Throat:  Acyclovir      Oragel  Famvir  Valtrex     Vaccines:         Flu Shot Leg Cramps:       *  Gardasil  Benadryl      Hepatitis A         Hepatitis B Nasal Spray:       Pneumovax  Saline Nasal Spray     Polio Booster         Tetanus Nausea:       Tuberculosis test or PPD  Vitamin B6 25 mg TID   AVOID:    Dramamine      *Gardasil  Emetrol       Live Poliovirus  Ginger Root 250 mg QID    MMR (measles, mumps &  High Complex Carbs @ Bedtime    rebella)  Sea Bands-Accupressure    Varicella (Chickenpox)  Unisom 1/2 tab TID     *No known complications           If received before Pain:         Known pregnancy;   Darvocet       Resume series after  Lortab        Delivery  Percocet    Yeast:   Tramadol      Femstat  Tylenol 3      Gyne-lotrimin  Ultram       Monistat  Vicodin           MISC:         All Sunscreens           Hair Coloring/highlights          Insect Repellant's          (Including DEET)         Mystic Tans

## 2018-08-17 LAB — URINALYSIS, ROUTINE W REFLEX MICROSCOPIC
Bilirubin, UA: NEGATIVE
Glucose, UA: NEGATIVE
Leukocytes, UA: NEGATIVE
NITRITE UA: NEGATIVE
PH UA: 5.5 (ref 5.0–7.5)
RBC, UA: NEGATIVE
Specific Gravity, UA: >=1.03 — AB (ref 1.005–1.030)
Urobilinogen, Ur: 0.2 mg/dL (ref 0.2–1.0)

## 2018-08-17 LAB — DRUG PROFILE, UR, 9 DRUGS (LABCORP)
AMPHETAMINES, URINE: NEGATIVE ng/mL
BARBITURATE QUANT UR: NEGATIVE ng/mL
Benzodiazepine Quant, Ur: NEGATIVE ng/mL
Cannabinoid Quant, Ur: NEGATIVE ng/mL
Cocaine (Metab.): NEGATIVE ng/mL
Methadone Screen, Urine: NEGATIVE ng/mL
Opiate Quant, Ur: NEGATIVE ng/mL
PCP Quant, Ur: NEGATIVE ng/mL
Propoxyphene: NEGATIVE ng/mL

## 2018-08-17 LAB — MICROSCOPIC EXAMINATION
Bacteria, UA: NONE SEEN
CASTS: NONE SEEN /LPF
Epithelial Cells (non renal): >10 /hpf — AB (ref 0–10)

## 2018-08-17 LAB — NICOTINE SCREEN, URINE: Cotinine Ql Scrn, Ur: NEGATIVE ng/mL

## 2018-08-18 LAB — GC/CHLAMYDIA PROBE AMP
Chlamydia trachomatis, NAA: NEGATIVE
Neisseria gonorrhoeae by PCR: NEGATIVE

## 2018-08-18 LAB — CULTURE, OB URINE

## 2018-08-18 LAB — HEMOGLOBIN FRAC.W/O SOLUBILITY
HGB C: 0 %
HGB S: 40.5 % — ABNORMAL HIGH
HGB VARIANT: 0 %
Hemoglobin A2 Quantitation: 3.5 % — ABNORMAL HIGH (ref 1.8–3.2)
Hemoglobin F Quantitation: 0 % (ref 0.0–2.0)
Hgb A: 56 % — ABNORMAL LOW (ref 96.4–98.8)

## 2018-08-18 LAB — ABO AND RH: Rh Factor: POSITIVE

## 2018-08-18 LAB — RPR: RPR Ser Ql: NONREACTIVE

## 2018-08-18 LAB — RUBELLA SCREEN: Rubella Antibodies, IGG: 2.3 index (ref >0.99)

## 2018-08-18 LAB — HGB SOLU + RFLX FRAC: Sickle Solubility Test - HGBRFX: POSITIVE — AB

## 2018-08-18 LAB — HEPATITIS B SURFACE ANTIGEN: Hepatitis B Surface Ag: NEGATIVE

## 2018-08-18 LAB — HIV ANTIBODY (ROUTINE TESTING W REFLEX): HIV Screen 4th Generation wRfx: NONREACTIVE

## 2018-08-18 LAB — ANTIBODY SCREEN: ANTIBODY SCREEN: NEGATIVE

## 2018-08-18 LAB — VARICELLA ZOSTER ANTIBODY, IGG: Varicella zoster IgG: <135 index — ABNORMAL LOW (ref >165)

## 2018-08-18 LAB — URINE CULTURE, OB REFLEX

## 2018-08-23 ENCOUNTER — Encounter: Payer: Medicaid Other | Admitting: Certified Nurse Midwife

## 2018-08-23 ENCOUNTER — Other Ambulatory Visit: Payer: Self-pay

## 2018-08-23 ENCOUNTER — Ambulatory Visit (INDEPENDENT_AMBULATORY_CARE_PROVIDER_SITE_OTHER): Payer: Medicaid Other | Admitting: Certified Nurse Midwife

## 2018-08-23 VITALS — BP 94/62 | HR 88 | Wt 99.4 lb

## 2018-08-23 DIAGNOSIS — O99011 Anemia complicating pregnancy, first trimester: Secondary | ICD-10-CM

## 2018-08-23 DIAGNOSIS — Z3491 Encounter for supervision of normal pregnancy, unspecified, first trimester: Secondary | ICD-10-CM

## 2018-08-23 DIAGNOSIS — Z3A11 11 weeks gestation of pregnancy: Secondary | ICD-10-CM | POA: Diagnosis not present

## 2018-08-23 DIAGNOSIS — D573 Sickle-cell trait: Secondary | ICD-10-CM

## 2018-08-23 DIAGNOSIS — Z1379 Encounter for other screening for genetic and chromosomal anomalies: Secondary | ICD-10-CM | POA: Diagnosis not present

## 2018-08-23 NOTE — Patient Instructions (Addendum)
Round Ligament Pain  The round ligament is a cord of muscle and tissue that helps support the uterus. It can become a source of pain during pregnancy if it becomes stretched or twisted as the baby grows. The pain usually begins in the second trimester (13-28 weeks) of pregnancy, and it can come and go until the baby is delivered. It is not a serious problem, and it does not cause harm to the baby. Round ligament pain is usually a short, sharp, and pinching pain, but it can also be a dull, lingering, and aching pain. The pain is felt in the lower side of the abdomen or in the groin. It usually starts deep in the groin and moves up to the outside of the hip area. The pain may occur when you:  Suddenly change position, such as quickly going from a sitting to standing position.  Roll over in bed.  Cough or sneeze.  Do physical activity. Follow these instructions at home:   Watch your condition for any changes.  When the pain starts, relax. Then try any of these methods to help with the pain: ? Sitting down. ? Flexing your knees up to your abdomen. ? Lying on your side with one pillow under your abdomen and another pillow between your legs. ? Sitting in a warm bath for 15-20 minutes or until the pain goes away.  Take over-the-counter and prescription medicines only as told by your health care provider.  Move slowly when you sit down or stand up.  Avoid long walks if they cause pain.  Stop or reduce your physical activities if they cause pain.  Keep all follow-up visits as told by your health care provider. This is important. Contact a health care provider if:  Your pain does not go away with treatment.  You feel pain in your back that you did not have before.  Your medicine is not helping. Get help right away if:  You have a fever or chills.  You develop uterine contractions.  You have vaginal bleeding.  You have nausea or vomiting.  You have diarrhea.  You have pain  when you urinate. Summary  Round ligament pain is felt in the lower abdomen or groin. It is usually a short, sharp, and pinching pain. It can also be a dull, lingering, and aching pain.  This pain usually begins in the second trimester (13-28 weeks). It occurs because the uterus is stretching with the growing baby, and it is not harmful to the baby.  You may notice the pain when you suddenly change position, when you cough or sneeze, or during physical activity.  Relaxing, flexing your knees to your abdomen, lying on one side, or taking a warm bath may help to get rid of the pain.  Get help from your health care provider if the pain does not go away or if you have vaginal bleeding, nausea, vomiting, diarrhea, or painful urination. This information is not intended to replace advice given to you by your health care provider. Make sure you discuss any questions you have with your health care provider. Document Released: 02/26/2008 Document Revised: 11/04/2017 Document Reviewed: 11/04/2017 Elsevier Interactive Patient Education  2019 Else[employer] Inc[employer] Back Pain in Pregnancy Back pain during pregnancy is common. Back pain may be caused by several factors that are related to changes during your pregnancy. Follow these instructions at home: Managing pain, stiffness, and swelling      If directed, for sudden (acute) back pain, put ice on the  painful area. ? Put ice in a plastic bag. ? Place a towel between your skin and the bag. ? Leave the ice on for 20 minutes, 2-3 times per day.  If directed, apply heat to the affected area before you exercise. Use the heat source that your health care provider recommends, such as a moist heat pack or a heating pad. ? Place a towel between your skin and the heat source. ? Leave the heat on for 20-30 minutes. ? Remove the heat if your skin turns bright red. This is especially important if you are unable to feel pain, heat, or cold. You may have a greater risk  of getting burned.  If directed, massage the affected area. Activity  Exercise as told by your health care provider. Gentle exercise is the best way to prevent or manage back pain.  Listen to your body when lifting. If lifting hurts, ask for help or bend your knees. This uses your leg muscles instead of your back muscles.  Squat down when picking up something from the floor. Do not bend over.  Only use bed rest for short periods as told by your health care provider. Bed rest should only be used for the most severe episodes of back pain. Standing, sitting, and lying down  Do not stand in one place for long periods of time.  Use good posture when sitting. Make sure your head rests over your shoulders and is not hanging forward. Use a pillow on your lower back if necessary.  Try sleeping on your side, preferably the left side, with a pregnancy support pillow or 1-2 regular pillows between your legs. ? If you have back pain after a night's rest, your bed may be too soft. ? A firm mattress may provide more support for your back during pregnancy. General instructions  Do not wear high heels.  Eat a healthy diet. Try to gain weight within your health care provider's recommendations.  Use a maternity girdle, elastic sling, or back brace as told by your health care provider.  Take over-the-counter and prescription medicines only as told by your health care provider.  Work with a physical therapist or massage therapist to find ways to manage back pain. Acupuncture or massage therapy may be helpful.  Keep all follow-up visits as told by your health care provider. This is important. Contact a health care provider if:  Your back pain interferes with your daily activities.  You have increasing pain in other parts of your body. Get help right away if:  You develop numbness, tingling, weakness, or problems with the use of your arms or legs.  You develop severe back pain that is not  controlled with medicine.  You have a change in bowel or bladder control.  You develop shortness of breath, dizziness, or you faint.  You develop nausea, vomiting, or sweating.  You have back pain that is a rhythmic, cramping pain similar to labor pains. Labor pain is usually 1-2 minutes apart, lasts for about 1 minute, and involves a bearing down feeling or pressure in your pelvis.  You have back pain and your water breaks or you have vaginal bleeding.  You have back pain or numbness that travels down your leg.  Your back pain developed after you fell.  You develop pain on one side of your back.  You see blood in your urine.  You develop skin blisters in the area of your back pain. Summary  Back pain may be caused by several  factors that are related to changes during your pregnancy.  Follow instructions as told by your health care provider for managing pain, stiffness, and swelling.  Exercise as told by your health care provider. Gentle exercise is the best way to prevent or manage back pain.  Take over-the-counter and prescription medicines only as told by your health care provider.  Keep all follow-up visits as told by your health care provider. This is important. This information is not intended to replace advice given to you by your health care provider. Make sure you discuss any questions you have with your health care provider. Document Released: 08/27/2005 Document Revised: 11/04/2017 Document Reviewed: 11/04/2017 Elsevier Interactive Patient Education  2019 [employer].   Common Medications Safe in Pregnancy  Acne:      Constipation:  Benzoyl Peroxide     Colace  Clindamycin      Dulcolax Suppository  Topica Erythromycin     Fibercon  Salicylic Acid      Metamucil         Miralax AVOID:        Senakot   Accutane    Cough:  Retin-A       Cough Drops  Tetracycline      Phenergan w/ Codeine if Rx  Minocycline      Robitussin (Plain &  DM)  Antibiotics:     Crabs/Lice:  Ceclor       RID  Cephalosporins    AVOID:  E-Mycins      Kwell  Keflex  Macrobid/Macrodantin   Diarrhea:  Penicillin      Kao-Pectate  Zithromax      Imodium AD         PUSH FLUIDS AVOID:       Cipro     Fever:  Tetracycline      Tylenol (Regular or Extra  Minocycline       Strength)  Levaquin      Extra Strength-Do not          Exceed 8 tabs/24 hrs Caffeine:        '200mg'$ /day (equiv. To 1 cup of coffee or  approx. 3 12 oz sodas)         Gas: Cold/Hayfever:       Gas-X  Benadryl      Mylicon  Claritin       Phazyme  **Claritin-D        Chlor-Trimeton    Headaches:  Dimetapp      ASA-Free Excedrin  Drixoral-Non-Drowsy     Cold Compress  Mucinex (Guaifenasin)     Tylenol (Regular or Extra  Sudafed/Sudafed-12 Hour     Strength)  **Sudafed PE Pseudoephedrine   Tylenol Cold & Sinus     Vicks Vapor Rub  Zyrtec  **AVOID if Problems With Blood Pressure         Heartburn: Avoid lying down for at least 1 hour after meals  Aciphex      Maalox     Rash:  Milk of Magnesia     Benadryl    Mylanta       1% Hydrocortisone Cream  Pepcid  Pepcid Complete   Sleep Aids:  Prevacid      Ambien   Prilosec       Benadryl  Rolaids       Chamomile Tea  Tums (Limit 4/day)     Unisom  Zantac       Tylenol PM         Warm  milk-add vanilla or  Hemorrhoids:       Sugar for taste  Anusol/Anusol H.C.  (RX: Analapram 2.5%)  Sugar Substitutes:  Hydrocortisone OTC     Ok in moderation  Preparation H      Tucks        Vaseline lotion applied to tissue with wiping    Herpes:     Throat:  Acyclovir      Oragel  Famvir  Valtrex     Vaccines:         Flu Shot Leg Cramps:       *Gardasil  Benadryl      Hepatitis A         Hepatitis B Nasal Spray:       Pneumovax  Saline Nasal Spray     Polio Booster         Tetanus Nausea:       Tuberculosis test or PPD  Vitamin B6 25 mg TID   AVOID:    Dramamine      *Gardasil  Emetrol       Live  Poliovirus  Ginger Root 250 mg QID    MMR (measles, mumps &  High Complex Carbs @ Bedtime    rebella)  Sea Bands-Accupressure    Varicella (Chickenpox)  Unisom 1/2 tab TID     *No known complications           If received before Pain:         Known pregnancy;   Darvocet       Resume series after  Lortab        Delivery  Percocet    Yeast:   Tramadol      Femstat  Tylenol 3      Gyne-lotrimin  Ultram       Monistat  Vicodin           MISC:         All Sunscreens           Hair Coloring/highlights          Insect Repellant's          (Including DEET)         Mystic Tans   Second Trimester of Pregnancy  The second trimester is from week 14 through week 27 (month 4 through 6). This is often the time in pregnancy that you feel your best. Often times, morning sickness has lessened or quit. You may have more energy, and you may get hungry more often. Your unborn baby is growing rapidly. At the end of the sixth month, he or she is about 9 inches long and weighs about 1 pounds. You will likely feel the baby move between 18 and 20 weeks of pregnancy. Follow these instructions at home: Medicines  Take over-the-counter and prescription medicines only as told by your doctor. Some medicines are safe and some medicines are not safe during pregnancy.  Take a prenatal vitamin that contains at least 600 micrograms (mcg) of folic acid.  If you have trouble pooping (constipation), take medicine that will make your stool soft (stool softener) if your doctor approves. Eating and drinking   Eat regular, healthy meals.  Avoid raw meat and uncooked cheese.  If you get low calcium from the food you eat, talk to your doctor about taking a daily calcium supplement.  Avoid foods that are high in fat and sugars, such as fried and sweet foods.  If you feel sick to your stomach (nauseous)  or throw up (vomit): ? Eat 4 or 5 small meals a day instead of 3 large meals. ? Try eating a few soda  crackers. ? Drink liquids between meals instead of during meals.  To prevent constipation: ? Eat foods that are high in fiber, like fresh fruits and vegetables, whole grains, and beans. ? Drink enough fluids to keep your pee (urine) clear or pale yellow. Activity  Exercise only as told by your doctor. Stop exercising if you start to have cramps.  Do not exercise if it is too hot, too humid, or if you are in a place of great height (high altitude).  Avoid heavy lifting.  Wear low-heeled shoes. Sit and stand up straight.  You can continue to have sex unless your doctor tells you not to. Relieving pain and discomfort  Wear a good support bra if your breasts are tender.  Take warm water baths (sitz baths) to soothe pain or discomfort caused by hemorrhoids. Use hemorrhoid cream if your doctor approves.  Rest with your legs raised if you have leg cramps or low back pain.  If you develop puffy, bulging veins (varicose veins) in your legs: ? Wear support hose or compression stockings as told by your doctor. ? Raise (elevate) your feet for 15 minutes, 3-4 times a day. ? Limit salt in your food. Prenatal care  Write down your questions. Take them to your prenatal visits.  Keep all your prenatal visits as told by your doctor. This is important. Safety  Wear your seat belt when driving.  Make a list of emergency phone numbers, including numbers for family, friends, the hospital, and police and fire departments. General instructions  Ask your doctor about the right foods to eat or for help finding a counselor, if you need these services.  Ask your doctor about local prenatal classes. Begin classes before month 6 of your pregnancy.  Do not use hot tubs, steam rooms, or saunas.  Do not douche or use tampons or scented sanitary pads.  Do not cross your legs for long periods of time.  Visit your dentist if you have not done so. Use a soft toothbrush to brush your teeth. Floss  gently.  Avoid all smoking, herbs, and alcohol. Avoid drugs that are not approved by your doctor.  Do not use any products that contain nicotine or tobacco, such as cigarettes and e-cigarettes. If you need help quitting, ask your doctor.  Avoid cat litter boxes and soil used by cats. These carry germs that can cause birth defects in the baby and can cause a loss of your baby (miscarriage) or stillbirth. Contact a doctor if:  You have mild cramps or pressure in your lower belly.  You have pain when you pee (urinate).  You have bad smelling fluid coming from your vagina.  You continue to feel sick to your stomach (nauseous), throw up (vomit), or have watery poop (diarrhea).  You have a nagging pain in your belly area.  You feel dizzy. Get help right away if:  You have a fever.  You are leaking fluid from your vagina.  You have spotting or bleeding from your vagina.  You have severe belly cramping or pain.  You lose or gain weight rapidly.  You have trouble catching your breath and have chest pain.  You notice sudden or extreme puffiness (swelling) of your face, hands, ankles, feet, or legs.  You have not felt the baby move in over an hour.  You have severe headaches  that do not go away when you take medicine.  You have trouble seeing. Summary  The second trimester is from week 14 through week 27 (months 4 through 6). This is often the time in pregnancy that you feel your best.  To take care of yourself and your unborn baby, you will need to eat healthy meals, take medicines only if your doctor tells you to do so, and do activities that are safe for you and your baby.  Call your doctor if you get sick or if you notice anything unusual about your pregnancy. Also, call your doctor if you need help with the right food to eat, or if you want to know what activities are safe for you. This information is not intended to replace advice given to you by your health care provider.  Make sure you discuss any questions you have with your health care provider. Document Released: 08/13/2009 Document Revised: 06/24/2016 Document Reviewed: 06/24/2016 Elsevier Interactive Patient Education  2019 [employer] [facility]    Q: Why are visitor restrictions different for maternity care areas?  [place] is restricting visitors for the duration of the patient's hospitalization. The birth of a child involves the mother, considered the patient, and a birthing partner. These are unprecedented times and we are making the exception to allow a birthing partner to be a part of the patient unit. No other guests will be allowed in our [place] at [facility] and at [facility].   Q: Are credentialed doulas allowed to support their existing patients?  We acknowledge the value these doula partnerships offer our care teams and many birthing families in our communities. Each laboring mother is allowed one birthing partner of the patient's choosing for her entire hospitalization.   Q: Are visitor restrictions different for hospitalized children?  Pediatric patients (infants and children under 4 years of age), such as those in the Children's Unit, Pediatric ICU and NICU, will be allowed two visitors (parents or legal guardians)   Q: Are pregnant women at an increased risk for COVID-19?  The [employer] of Obstetricians and Gynecologists (ACOG) is monitoring closely the coronavirus pandemic. With the limited information available, data does not indicate pregnant women are at an increased risk. However, pregnant women are known to be at greater risk for respiratory infections like flu. With that in mind, expectant mothers are considered an at-risk population for COVID-19, according to ACOG.   Q: Are  newborns at an increased risk for COVID-19?  A limited sample of COVID-19 data with newborns indicates the virus is not transferred to the infant during pregnancy. However, postpartum separation is recommended by the Centers for Disease Control (CDC). As a result [place] recommends and strongly encourages temporary separation of moms and babies who test positive for COVID-19  or are awaiting results to rule out COVID-19 based on CDC guidelines.   Q: If you have a suspected case of COVID-19, is the NICU couplet care room an option?  No. If either patient is considered at-risk for having COVID-19, the [employer] at [facility] will not use the NICU couplet care rooms for that family.   Q: [place] is urging that elective procedures be postponed. What is considered elective for women's and children's service line?  NOT ELECTIVE: Obstetric procedures, even those with an element of choice on timing, are not considered elective. Circumcisions are considered elective procedures, however, these do not deplete blood products and other resources, which is the spirit in which the COVID-19 postponement of elective procedures was intended. Therefore, circumcisions will be allowed.   ELECTIVE: Postpartum tubal ligations are considered elective and should be postponed. Q&A for Obstetricians, Gynecologists and Pediatricians  Published August 20, 2018   [facility] supports as much as possible the medical care team working with the patient's individual needs to address timing during these unprecedented times. We seek the support of our medical care team in preserving needed resources throughout our crisis response to COVID-19.   Q: How does COVID-19 impact breastfeeding?  Breastmilk is safe for your baby - even if the mother has tested positive for COVID-19. If a COVID-19+ mother decides to breastfeed while inpatient and after discharge, we suggest proper protective equipment be worn and  hand hygiene be performed before and after feeding the infant. The new mother also has the option to pump her milk and have a healthy family member feed the baby to protect the baby from getting the virus.   Q: Should we urge patients to avoid baby showers and large gatherings?  Yes. As has been recommended for all citizens in our communities, gatherings of 10 or more should be avoided - pregnant or not. Seek creative options for "hosting" baby showers through electronic means that honor the request for social distancing during this time of heightened awareness.   Q: Should patients miss their prenatal appointments?  No. Prenatal visits are NOT elective. While we want to limit contact and exposure, prenatal care is vital right now. Contact your physician's office if you have concerns about your visits. We are limiting outpatient office visits to the patient and one guest in order to reduce the potential for exposure.   Q: What if a pregnant woman feels sick? Should she miss her prenatal visit then?  A pregnant woman experiencing coronavirus-like symptoms (i.e., cough, fever, difficulty breathing, shortness of breath, gastrointestinal issues) should contact her pregnancy care provider by phone. Her medical professional can best determine whether she should use a video visit or possibly go to a collection site to be tested for COVID-19. Contacting her primary care provider or her pregnancy care provider is her first step.   Q: What can I do about childbirth education? All the classes are cancelled.  The [facility] [place] will offer online learning to support mothers on their journey. We currently offer Understanding Childbirth, Understanding Breastfeeding and Understanding Newborn Care as an online class. Please visit our website, [email or web address], to register for an online class.   Q: How can I keep from getting COVID-19? Q&A for Obstetricians, Gynecologists and Pediatricians   Published August 20, 2018   Together, we can reduce the risk of exposure to the virus and help you and your family remain healthy and safe. One of the best ways to  protect yourself is to wash your hands frequently using soap and water. Also, you should avoid touching your eyes, nose and mouth with unwashed hands, avoid physical contact with others and practice social distancing.   Q: How are employees being informed about what to do?  [facility] leaders receive a daily COVID-19 update and share relevant information with their teams. This is a time when health care professionals are called on to lead within our community. We appreciate our staff's engagement with our COVID-19 updates and encourage them to share best practices on reducing the spread of the virus with our patients and community. We are prepared to provide the exceptional COVID-19 care and coordination our community needs, expects and deserves.   Q: Who's in charge of this issue at [facility]?  The leadership structure and process established to address COVID-19 includes Chief Physician Executive Phoebe Sharps, MD; Infection Prevention Medical Director Carlyle Basques, MD; and Infection Prevention Interim Director Hubert Azure, MSN, RN, CIC, CSPDT. A team of [place] experts reflecting a broad spectrum of our workforce is meeting daily to evaluate new information we receive about TOIZT-24 and to adapt policies and practices accordingly.                         Published August 20, 2018      Coronavirus (COVID-19) Are you at risk?  Are you at risk for the Coronavirus (COVID-19)?  To be considered HIGH RISK for Coronavirus (COVID-19), you have to meet the following criteria:  . Traveled to [place], [place], [place], [place] or [place]; or in the [place] to [place], [place], [place], or [place]; and have fever, cough, and shortness of breath within the last 2 weeks of travel OR . Been in close contact with a person  diagnosed with COVID-19 within the last 2 weeks and have fever, cough, and shortness of breath . IF YOU DO NOT MEET THESE CRITERIA, YOU ARE CONSIDERED LOW RISK FOR COVID-19.  What to do if you are HIGH RISK for COVID-19?  Marland Kitchen If you are having a medical emergency, call 911. . Seek medical care right away. Before you go to a doctor's office, urgent care or emergency department, call ahead and tell them about your recent travel, contact with someone diagnosed with COVID-19, and your symptoms. You should receive instructions from your physician's office regarding next steps of care.  . When you arrive at healthcare provider, tell the healthcare staff immediately you have returned from visiting [place], [place], [place], [place] or [place]; or traveled in the [place] to [place], [place], [place], or [place]; in the last two weeks or you have been in close contact with a person diagnosed with COVID-19 in the last 2 weeks.   . Tell the health care staff about your symptoms: fever, cough and shortness of breath. . After you have been seen by a medical provider, you will be either: o Tested for (COVID-19) and discharged home on quarantine except to seek medical care if symptoms worsen, and asked to  - Stay home and avoid contact with others until you get your results (4-5 days)  - Avoid travel on public transportation if possible (such as bus, train, or airplane) or o Sent to the Emergency Department by EMS for evaluation, COVID-19 testing, and possible admission depending on your condition and test results.  What to do if you are LOW RISK for COVID-19?  Reduce your risk  of any infection by using the same precautions used for avoiding the common cold or flu:  Marland Kitchen Wash your hands often with soap and warm water for at least 20 seconds.  If soap and water are not readily available, use an alcohol-based hand sanitizer with at least 60% alcohol.  . If coughing or sneezing, cover your mouth and nose by  coughing or sneezing into the elbow areas of your shirt or coat, into a tissue or into your sleeve (not your hands). . Avoid shaking hands with others and consider head nods or verbal greetings only. . Avoid touching your eyes, nose, or mouth with unwashed hands.  . Avoid close contact with people who are sick. . Avoid places or events with large numbers of people in one location, like concerts or sporting events. . Carefully consider travel plans you have or are making. . If you are planning any travel outside or inside the [place], visit the CDC's Travelers' Health webpage for the latest health notices. . If you have some symptoms but not all symptoms, continue to monitor at home and seek medical attention if your symptoms worsen. . If you are having a medical emergency, call 911.   [place] / e-Visit: [email or web address]         [facility]: [facility]: [phone]                   [facility]: [phone]

## 2018-08-23 NOTE — Progress Notes (Signed)
NEW OB HISTORY AND PHYSICAL  SUBJECTIVE:       Yolanda Garcia is a 23 y.o. G1P0 female, Patient's last menstrual period was 06/02/2018 (exact date)., Estimated Date of Delivery: 03/12/19, [redacted]w[redacted]d, presents today for establishment of Prenatal Care.  Endorses resolving nausea with no vomiting since starting Diclegis, intermittent breast tenderness, and occasional back pain.   Denies difficulty breathing or respiratory distress, chest pain, abdominal pain, vaginal bleeding, dysuria, and leg pain or swelling.   Desires genetic screening.    Gynecologic History  Patient's last menstrual period was 06/02/2018 (exact date).   Contraception: none  Last Pap: N/A.   Obstetric History  OB History  Gravida Para Term Preterm AB Living  1            SAB TAB Ectopic Multiple Live Births               # Outcome Date GA Lbr Len/2nd Weight Sex Delivery Anes PTL Lv  1 Current             Past Medical History:  Diagnosis Date  . Panic attack     No past surgical history on file.  Current Outpatient Medications on File Prior to Visit  Medication Sig Dispense Refill  . DICLEGIS 10-10 MG TBEC Take 1 tablet by mouth 2 (two) times daily. 60 tablet 2  . Prenatal Vit-Fe Fumarate-FA (MULTIVITAMIN-PRENATAL) 27-0.8 MG TABS tablet Take 1 tablet by mouth daily at 12 noon.     No current facility-administered medications on file prior to visit.     No Known Allergies  Social History   Socioeconomic History  . Marital status: Single    Spouse name: Not on file  . Number of children: Not on file  . Years of education: Not on file  . Highest education level: Not on file  Occupational History  . Not on file  Social Needs  . Financial resource strain: Not on file  . Food insecurity:    Worry: Not on file    Inability: Not on file  . Transportation needs:    Medical: Not on file    Non-medical: Not on file  Tobacco Use  . Smoking status: Never Smoker  . Smokeless tobacco: Never Used   Substance and Sexual Activity  . Alcohol use: No  . Drug use: Never  . Sexual activity: Yes  Lifestyle  . Physical activity:    Days per week: Not on file    Minutes per session: Not on file  . Stress: Not on file  Relationships  . Social connections:    Talks on phone: Not on file    Gets together: Not on file    Attends religious service: Not on file    Active member of club or organization: Not on file    Attends meetings of clubs or organizations: Not on file    Relationship status: Not on file  . Intimate partner violence:    Fear of current or ex partner: Not on file    Emotionally abused: Not on file    Physically abused: Not on file    Forced sexual activity: Not on file  Other Topics Concern  . Not on file  Social History Narrative  . Not on file    Family History  Problem Relation Age of Onset  . Healthy Mother     The following portions of the patient's history were reviewed and updated as appropriate: allergies, current medications, past OB history, past medical  history, past surgical history, past family history, past social history, and problem list.  Review of Systems:  ROS negative except as noted above. Information obtained from patient.   OBJECTIVE:  BP 94/62   Pulse 88   Wt 99 lb 6.4 oz (45.1 kg)   LMP 06/02/2018 (Exact Date)   BMI 17.61 kg/m   Initial Physical Exam (New OB)  GENERAL APPEARANCE: alert, well appearing, in no apparent distress  HEAD: normocephalic, atraumatic  MOUTH: mucous membranes moist, pharynx normal without lesions  THYROID: no thyromegaly or masses present  BREASTS: not examined  LUNGS: clear to auscultation, no wheezes, rales or rhonchi, symmetric air entry  HEART: regular rate and rhythm, no murmurs  ABDOMEN: soft, nontender, nondistended, no abnormal masses, no epigastric pain and FHT present  EXTREMITIES: no redness or tenderness in the calves or thighs, no edema  SKIN: normal coloration and turgor, no  rashes  LYMPH NODES: no adenopathy palpable  NEUROLOGIC: alert, oriented, normal speech, no focal findings or movement disorder noted  PELVIC EXAM: not examined  ASSESSMENT: Normal pregnancy Rh positive Sickle cell trait positive-informed Desires genetic screening-Panorama ordered today Nausea and vomiting in pregnancy  PLAN: Prenatal care New OB counseling: The patient has been given an overview regarding routine prenatal care. Recommendations regarding diet, weight gain, and exercise in pregnancy were given. Prenatal testing, optional genetic testing, and ultrasound use in pregnancy were reviewed.  Benefits of Breast Feeding were discussed. The patient is encouraged to consider nursing her baby post partum. See orders   Gunnar Bulla, CNM [facility], [facility] 08/23/18 10:07 AM

## 2018-08-23 NOTE — Progress Notes (Signed)
NOB PE- pt is here she is doing well, is taking her diclegis working well

## 2018-08-24 LAB — CBC WITH DIFFERENTIAL/PLATELET
Basophils Absolute: 0 10*3/uL (ref 0.0–0.2)
Basos: 1 %
EOS (ABSOLUTE): 0.1 10*3/uL (ref 0.0–0.4)
Eos: 2 %
Hematocrit: 32.7 % — ABNORMAL LOW (ref 34.0–46.6)
Hemoglobin: 10.7 g/dL — ABNORMAL LOW (ref 11.1–15.9)
Immature Grans (Abs): 0 10*3/uL (ref 0.0–0.1)
Immature Granulocytes: 1 %
LYMPHS ABS: 1.7 10*3/uL (ref 0.7–3.1)
Lymphs: 32 %
MCH: 27.6 pg (ref 26.6–33.0)
MCHC: 32.7 g/dL (ref 31.5–35.7)
MCV: 84 fL (ref 79–97)
Monocytes Absolute: 0.5 10*3/uL (ref 0.1–0.9)
Monocytes: 10 %
Neutrophils Absolute: 2.8 10*3/uL (ref 1.4–7.0)
Neutrophils: 54 %
PLATELETS: 224 10*3/uL (ref 150–450)
RBC: 3.88 x10E6/uL (ref 3.77–5.28)
RDW: 13 % (ref 11.7–15.4)
WBC: 5.1 10*3/uL (ref 3.4–10.8)

## 2018-09-06 ENCOUNTER — Telehealth: Payer: Self-pay | Admitting: Certified Nurse Midwife

## 2018-09-06 ENCOUNTER — Encounter: Payer: Self-pay | Admitting: Certified Nurse Midwife

## 2018-09-06 ENCOUNTER — Telehealth: Payer: Self-pay

## 2018-09-06 NOTE — Telephone Encounter (Signed)
The patient called and stated she is having lower pelvic/abdominal pain on the left side and would like to speak with a nurse if possible. Please advise.

## 2018-09-06 NOTE — Telephone Encounter (Signed)
See mychart message 09/06/18

## 2018-09-17 ENCOUNTER — Encounter: Payer: Self-pay | Admitting: Certified Nurse Midwife

## 2018-09-19 ENCOUNTER — Encounter: Payer: Self-pay | Admitting: Certified Nurse Midwife

## 2018-09-20 ENCOUNTER — Encounter: Payer: Self-pay | Admitting: Certified Nurse Midwife

## 2018-10-11 ENCOUNTER — Telehealth: Payer: Self-pay

## 2018-10-11 NOTE — Telephone Encounter (Signed)
Coronavirus (COVID-19) Are you at risk?  Are you at risk for the Coronavirus (COVID-19)?  To be considered HIGH RISK for Coronavirus (COVID-19), you have to meet the following criteria:  . Traveled to [place], [place], [place], [place] or [place]; or in the [place] to [place], [place], [place], or [place]; and have fever, cough, and shortness of breath within the last 2 weeks of travel OR . Been in close contact with a person diagnosed with COVID-19 within the last 2 weeks and have fever, cough, and shortness of breath . IF YOU DO NOT MEET THESE CRITERIA, YOU ARE CONSIDERED LOW RISK FOR COVID-19.  What to do if you are HIGH RISK for COVID-19?  . If you are having a medical emergency, call 911. . Seek medical care right away. Before you go to a doctor's office, urgent care or emergency department, call ahead and tell them about your recent travel, contact with someone diagnosed with COVID-19, and your symptoms. You should receive instructions from your physician's office regarding next steps of care.  . When you arrive at healthcare provider, tell the healthcare staff immediately you have returned from visiting [place], [place], [place], [place] or [place]; or traveled in the [place] to [place], [place], [place], or [place]; in the last two weeks or you have been in close contact with a person diagnosed with COVID-19 in the last 2 weeks.   . Tell the health care staff about your symptoms: fever, cough and shortness of breath. . After you have been seen by a medical provider, you will be either: o Tested for (COVID-19) and discharged home on quarantine except to seek medical care if symptoms worsen, and asked to  - Stay home and avoid contact with others until you get your results (4-5 days)  - Avoid travel on public transportation if possible (such as bus, train, or airplane) or o Sent to the Emergency Department by EMS for evaluation, COVID-19 testing, and possible  admission depending on your condition and test results.  What to do if you are LOW RISK for COVID-19?  Reduce your risk of any infection by using the same precautions used for avoiding the common cold or flu:  . Wash your hands often with soap and warm water for at least 20 seconds.  If soap and water are not readily available, use an alcohol-based hand sanitizer with at least 60% alcohol.  . If coughing or sneezing, cover your mouth and nose by coughing or sneezing into the elbow areas of your shirt or coat, into a tissue or into your sleeve (not your hands). . Avoid shaking hands with others and consider head nods or verbal greetings only. . Avoid touching your eyes, nose, or mouth with unwashed hands.  . Avoid close contact with people who are sick. . Avoid places or events with large numbers of people in one location, like concerts or sporting events. . Carefully consider travel plans you have or are making. . If you are planning any travel outside or inside the [place], visit the CDC's Travelers' Health webpage for the latest health notices. . If you have some symptoms but not all symptoms, continue to monitor at home and seek medical attention if your symptoms worsen. . If you are having a medical emergency, call 911.   ADDITIONAL HEALTHCARE OPTIONS FOR PATIENTS  [facility] / e-Visit: [email or web address]         [facility]: [phone]  [facility]: [phone]                   [facility]: [phone]  Prescreened. cm 

## 2018-10-12 ENCOUNTER — Other Ambulatory Visit: Payer: Self-pay

## 2018-10-12 ENCOUNTER — Other Ambulatory Visit: Payer: Self-pay | Admitting: Obstetrics and Gynecology

## 2018-10-12 ENCOUNTER — Encounter: Payer: Medicaid Other | Admitting: Certified Nurse Midwife

## 2018-10-12 ENCOUNTER — Other Ambulatory Visit (INDEPENDENT_AMBULATORY_CARE_PROVIDER_SITE_OTHER): Payer: Medicaid Other

## 2018-10-12 ENCOUNTER — Ambulatory Visit (INDEPENDENT_AMBULATORY_CARE_PROVIDER_SITE_OTHER): Payer: Medicaid Other | Admitting: Certified Nurse Midwife

## 2018-10-12 VITALS — BP 104/46 | HR 83 | Wt 100.2 lb

## 2018-10-12 DIAGNOSIS — Z3689 Encounter for other specified antenatal screening: Secondary | ICD-10-CM

## 2018-10-12 DIAGNOSIS — Z3A18 18 weeks gestation of pregnancy: Secondary | ICD-10-CM

## 2018-10-12 DIAGNOSIS — Z3492 Encounter for supervision of normal pregnancy, unspecified, second trimester: Secondary | ICD-10-CM

## 2018-10-12 LAB — POCT URINALYSIS DIPSTICK OB
Bilirubin, UA: NEGATIVE
Blood, UA: NEGATIVE
Glucose, UA: NEGATIVE
Leukocytes, UA: NEGATIVE
Nitrite, UA: NEGATIVE
Spec Grav, UA: 1.015 (ref 1.010–1.025)
Urobilinogen, UA: 0.2 E.U./dL
pH, UA: 6 (ref 5.0–8.0)

## 2018-10-12 MED ORDER — FOLIC ACID 1 MG PO TABS: 1.0000 mg | ORAL_TABLET | Freq: Every day | ORAL | 10 refills | Status: DC

## 2018-10-12 MED ORDER — DICLEGIS 10-10 MG PO TBEC: 1.0000 | DELAYED_RELEASE_TABLET | Freq: Two times a day (BID) | ORAL | 2 refills | Status: DC

## 2018-10-12 NOTE — Progress Notes (Signed)
ROB-Patient having difficulty sleeping, wondering if she can get something to help.  Also, c/o intermittent thin clear vaginal d/c. Stopped taking PNV "they make me sick".

## 2018-10-12 NOTE — Patient Instructions (Signed)
Round Ligament Pain  The round ligament is a cord of muscle and tissue that helps support the uterus. It can become a source of pain during pregnancy if it becomes stretched or twisted as the baby grows. The pain usually begins in the second trimester (13-28 weeks) of pregnancy, and it can come and go until the baby is delivered. It is not a serious problem, and it does not cause harm to the baby. Round ligament pain is usually a short, sharp, and pinching pain, but it can also be a dull, lingering, and aching pain. The pain is felt in the lower side of the abdomen or in the groin. It usually starts deep in the groin and moves up to the outside of the hip area. The pain may occur when you:  Suddenly change position, such as quickly going from a sitting to standing position.  Roll over in bed.  Cough or sneeze.  Do physical activity. Follow these instructions at home:   Watch your condition for any changes.  When the pain starts, relax. Then try any of these methods to help with the pain: ? Sitting down. ? Flexing your knees up to your abdomen. ? Lying on your side with one pillow under your abdomen and another pillow between your legs. ? Sitting in a warm bath for 15-20 minutes or until the pain goes away.  Take over-the-counter and prescription medicines only as told by your health care provider.  Move slowly when you sit down or stand up.  Avoid long walks if they cause pain.  Stop or reduce your physical activities if they cause pain.  Keep all follow-up visits as told by your health care provider. This is important. Contact a health care provider if:  Your pain does not go away with treatment.  You feel pain in your back that you did not have before.  Your medicine is not helping. Get help right away if:  You have a fever or chills.  You develop uterine contractions.  You have vaginal bleeding.  You have nausea or vomiting.  You have diarrhea.  You have pain  when you urinate. Summary  Round ligament pain is felt in the lower abdomen or groin. It is usually a short, sharp, and pinching pain. It can also be a dull, lingering, and aching pain.  This pain usually begins in the second trimester (13-28 weeks). It occurs because the uterus is stretching with the growing baby, and it is not harmful to the baby.  You may notice the pain when you suddenly change position, when you cough or sneeze, or during physical activity.  Relaxing, flexing your knees to your abdomen, lying on one side, or taking a warm bath may help to get rid of the pain.  Get help from your health care provider if the pain does not go away or if you have vaginal bleeding, nausea, vomiting, diarrhea, or painful urination. This information is not intended to replace advice given to you by your health care provider. Make sure you discuss any questions you have with your health care provider. Document Released: 02/26/2008 Document Revised: 11/04/2017 Document Reviewed: 11/04/2017 Elsevier Interactive Patient Education  2019 Else[employer] Inc. Back Pain in Pregnancy Back pain during pregnancy is common. Back pain may be caused by several factors that are related to changes during your pregnancy. Follow these instructions at home: Managing pain, stiffness, and swelling      If directed, for sudden (acute) back pain, put ice on the  painful area. ? Put ice in a plastic bag. ? Place a towel between your skin and the bag. ? Leave the ice on for 20 minutes, 2-3 times per day.  If directed, apply heat to the affected area before you exercise. Use the heat source that your health care provider recommends, such as a moist heat pack or a heating pad. ? Place a towel between your skin and the heat source. ? Leave the heat on for 20-30 minutes. ? Remove the heat if your skin turns bright red. This is especially important if you are unable to feel pain, heat, or cold. You may have a greater risk  of getting burned.  If directed, massage the affected area. Activity  Exercise as told by your health care provider. Gentle exercise is the best way to prevent or manage back pain.  Listen to your body when lifting. If lifting hurts, ask for help or bend your knees. This uses your leg muscles instead of your back muscles.  Squat down when picking up something from the floor. Do not bend over.  Only use bed rest for short periods as told by your health care provider. Bed rest should only be used for the most severe episodes of back pain. Standing, sitting, and lying down  Do not stand in one place for long periods of time.  Use good posture when sitting. Make sure your head rests over your shoulders and is not hanging forward. Use a pillow on your lower back if necessary.  Try sleeping on your side, preferably the left side, with a pregnancy support pillow or 1-2 regular pillows between your legs. ? If you have back pain after a night's rest, your bed may be too soft. ? A firm mattress may provide more support for your back during pregnancy. General instructions  Do not wear high heels.  Eat a healthy diet. Try to gain weight within your health care provider's recommendations.  Use a maternity girdle, elastic sling, or back brace as told by your health care provider.  Take over-the-counter and prescription medicines only as told by your health care provider.  Work with a physical therapist or massage therapist to find ways to manage back pain. Acupuncture or massage therapy may be helpful.  Keep all follow-up visits as told by your health care provider. This is important. Contact a health care provider if:  Your back pain interferes with your daily activities.  You have increasing pain in other parts of your body. Get help right away if:  You develop numbness, tingling, weakness, or problems with the use of your arms or legs.  You develop severe back pain that is not  controlled with medicine.  You have a change in bowel or bladder control.  You develop shortness of breath, dizziness, or you faint.  You develop nausea, vomiting, or sweating.  You have back pain that is a rhythmic, cramping pain similar to labor pains. Labor pain is usually 1-2 minutes apart, lasts for about 1 minute, and involves a bearing down feeling or pressure in your pelvis.  You have back pain and your water breaks or you have vaginal bleeding.  You have back pain or numbness that travels down your leg.  Your back pain developed after you fell.  You develop pain on one side of your back.  You see blood in your urine.  You develop skin blisters in the area of your back pain. Summary  Back pain may be caused by several  factors that are related to changes during your pregnancy.  Follow instructions as told by your health care provider for managing pain, stiffness, and swelling.  Exercise as told by your health care provider. Gentle exercise is the best way to prevent or manage back pain.  Take over-the-counter and prescription medicines only as told by your health care provider.  Keep all follow-up visits as told by your health care provider. This is important. This information is not intended to replace advice given to you by your health care provider. Make sure you discuss any questions you have with your health care provider. Document Released: 08/27/2005 Document Revised: 11/04/2017 Document Reviewed: 11/04/2017 Elsevier Interactive Patient Education  2019 [employer].   Common Medications Safe in Pregnancy  Acne:      Constipation:  Benzoyl Peroxide     Colace  Clindamycin      Dulcolax Suppository  Topica Erythromycin     Fibercon  Salicylic Acid      Metamucil         Miralax AVOID:        Senakot   Accutane    Cough:  Retin-A       Cough Drops  Tetracycline      Phenergan w/ Codeine if Rx  Minocycline      Robitussin (Plain & DM)  Antibiotics:      Crabs/Lice:  Ceclor       RID  Cephalosporins    AVOID:  E-Mycins      Kwell  Keflex  Macrobid/Macrodantin   Diarrhea:  Penicillin      Kao-Pectate  Zithromax      Imodium AD         PUSH FLUIDS AVOID:       Cipro     Fever:  Tetracycline      Tylenol (Regular or Extra  Minocycline       Strength)  Levaquin      Extra Strength-Do not          Exceed 8 tabs/24 hrs Caffeine:        '200mg'$ /day (equiv. To 1 cup of coffee or  approx. 3 12 oz sodas)         Gas: Cold/Hayfever:       Gas-X  Benadryl      Mylicon  Claritin       Phazyme  **Claritin-D        Chlor-Trimeton    Headaches:  Dimetapp      ASA-Free Excedrin  Drixoral-Non-Drowsy     Cold Compress  Mucinex (Guaifenasin)     Tylenol (Regular or Extra  Sudafed/Sudafed-12 Hour     Strength)  **Sudafed PE Pseudoephedrine   Tylenol Cold & Sinus     Vicks Vapor Rub  Zyrtec  **AVOID if Problems With Blood Pressure         Heartburn: Avoid lying down for at least 1 hour after meals  Aciphex      Maalox     Rash:  Milk of Magnesia     Benadryl    Mylanta       1% Hydrocortisone Cream  Pepcid  Pepcid Complete   Sleep Aids:  Prevacid      Ambien   Prilosec       Benadryl  Rolaids       Chamomile Tea  Tums (Limit 4/day)     Unisom         Tylenol PM         Warm  milk-add vanilla or  Hemorrhoids:       Sugar for taste  Anusol/Anusol H.C.  (RX: Analapram 2.5%)  Sugar Substitutes:  Hydrocortisone OTC     Ok in moderation  Preparation H      Tucks        Vaseline lotion applied to tissue with wiping    Herpes:     Throat:  Acyclovir      Oragel  Famvir  Valtrex     Vaccines:         Flu Shot Leg Cramps:       *Gardasil  Benadryl      Hepatitis A         Hepatitis B Nasal Spray:       Pneumovax  Saline Nasal Spray     Polio Booster         Tetanus Nausea:       Tuberculosis test or PPD  Vitamin B6 25 mg TID   AVOID:    Dramamine      *Gardasil  Emetrol       Live Poliovirus  Ginger Root 250 mg QID    MMR  (measles, mumps &  High Complex Carbs @ Bedtime    rebella)  Sea Bands-Accupressure    Varicella (Chickenpox)  Unisom 1/2 tab TID     *No known complications           If received before Pain:         Known pregnancy;   Darvocet       Resume series after  Lortab        Delivery  Percocet    Yeast:   Tramadol      Femstat  Tylenol 3      Gyne-lotrimin  Ultram       Monistat  Vicodin           MISC:         All Sunscreens           Hair Coloring/highlights          Insect Repellant's          (Including DEET)         Mystic Tans

## 2018-10-12 NOTE — Progress Notes (Signed)
ROB-Reports insomnia. Discussed home treatment measures including sleep hygiene. Anatomy scan today INCOMPLETE, but normal. No longer taking prenatal vitamin due to nausea. Encouraged folic acid daily, see orders. Anticipatory guidance regarding course of prenatal care. Reviewed red flag symptoms and when to call. RTC x 2-3 weeks for follow up ANATOMY SCAN and ROB or sooner if needed.   ULTRASOUND REPORT  Location: [facility] Date of Service: 10/12/2018   Indications:Anatomy Ultrasound Findings:  Singleton intrauterine pregnancy is visualized with FHR at 147 BPM. Biometrics give an (U/S) Gestational age of [redacted]w[redacted]d and an (U/S) EDD of 03/15/2019; this correlates with the clinically established Estimated Date of Delivery: 03/12/19  Fetal presentation is transverse.  EFW: 227 g ( 8 oz ). Placenta: posterior. Grade: 1 AFI: subjectively normal.  Anatomic survey is incomplete for Spine; Gender - female.    Right Ovary is normal in appearance. Left Ovary is normal appearance. Survey of the adnexa demonstrates no adnexal masses. There is no free peritoneal fluid in the cul de sac.  Impression: 1. [redacted]w[redacted]d Viable Singleton Intrauterine pregnancy by U/S. 2. (U/S) EDD is consistent with Clinically established Estimated Date of Delivery: 03/12/19 . 3. Anatomic survey is incomplete for Spine.  Recommendations: 1.Clinical correlation with the patient's History and Physical Exam.

## 2018-10-25 ENCOUNTER — Encounter: Payer: Self-pay | Admitting: Certified Nurse Midwife

## 2018-11-02 ENCOUNTER — Other Ambulatory Visit: Payer: Self-pay

## 2018-11-02 ENCOUNTER — Ambulatory Visit (INDEPENDENT_AMBULATORY_CARE_PROVIDER_SITE_OTHER): Payer: Medicaid Other | Admitting: Certified Nurse Midwife

## 2018-11-02 ENCOUNTER — Encounter: Payer: Self-pay | Admitting: Certified Nurse Midwife

## 2018-11-02 ENCOUNTER — Ambulatory Visit (INDEPENDENT_AMBULATORY_CARE_PROVIDER_SITE_OTHER): Payer: Medicaid Other

## 2018-11-02 VITALS — BP 93/57 | HR 95 | Wt 102.4 lb

## 2018-11-02 DIAGNOSIS — Z3492 Encounter for supervision of normal pregnancy, unspecified, second trimester: Secondary | ICD-10-CM | POA: Diagnosis not present

## 2018-11-02 DIAGNOSIS — Z3689 Encounter for other specified antenatal screening: Secondary | ICD-10-CM | POA: Diagnosis not present

## 2018-11-02 LAB — POCT URINALYSIS DIPSTICK OB
Bilirubin, UA: NEGATIVE
Blood, UA: NEGATIVE
Glucose, UA: NEGATIVE
Ketones, UA: NEGATIVE
Leukocytes, UA: NEGATIVE
Nitrite, UA: NEGATIVE
POC,PROTEIN,UA: NEGATIVE
Spec Grav, UA: 1.015 (ref 1.010–1.025)
Urobilinogen, UA: 0.2 E.U./dL
pH, UA: 5 (ref 5.0–8.0)

## 2018-11-02 NOTE — Progress Notes (Signed)
93

## 2018-11-02 NOTE — Patient Instructions (Signed)

## 2018-11-02 NOTE — Progress Notes (Addendum)
ROB doing well. U/s today for completion of anatomy scan ( see below). Anatomy now complete and normal. Results reviewed. Pt feels good movement. Follow up 3 wks with Melody.   Doreene [facility], CNM   Patient Name: Yolanda Garcia DOB: 1996-05-29 MRN: [id number] ULTRASOUND REPORT  Location: [facility] Date of Service: 11/02/2018   Indications: Anatomy follow up ultrasound Findings:  Mason Jim intrauterine pregnancy is visualized with FHR at 133 BPM.  Fetal presentation is Cephalic.  Placenta: posterior. Grade: 1 AFI: subjectively normal.  Anatomic survey is complete.   There is no free peritoneal fluid in the cul de sac.  Impression: 1. [redacted]w[redacted]d Viable Singleton Intrauterine pregnancy previously established criteria. 2. Normal Anatomy Scan is now complete  Recommendations: 1.Clinical correlation with the patient's History and Physical Exam.   Jenine M. Marciano Sequin    RDMS

## 2018-11-30 ENCOUNTER — Other Ambulatory Visit: Payer: Self-pay

## 2018-11-30 ENCOUNTER — Encounter: Payer: Medicaid Other | Admitting: Obstetrics and Gynecology

## 2018-12-01 ENCOUNTER — Ambulatory Visit (INDEPENDENT_AMBULATORY_CARE_PROVIDER_SITE_OTHER): Payer: Medicaid Other | Admitting: Certified Nurse Midwife

## 2018-12-01 ENCOUNTER — Encounter: Payer: Self-pay | Admitting: Certified Nurse Midwife

## 2018-12-01 VITALS — BP 100/46 | HR 96 | Wt 109.2 lb

## 2018-12-01 DIAGNOSIS — Z3402 Encounter for supervision of normal first pregnancy, second trimester: Secondary | ICD-10-CM

## 2018-12-01 LAB — POCT URINALYSIS DIPSTICK OB
Bilirubin, UA: NEGATIVE
Blood, UA: NEGATIVE
Glucose, UA: NEGATIVE
Ketones, UA: NEGATIVE
Leukocytes, UA: NEGATIVE
Nitrite, UA: NEGATIVE
POC,PROTEIN,UA: NEGATIVE
Spec Grav, UA: 1.02 (ref 1.010–1.025)
Urobilinogen, UA: 0.2 E.U./dL
pH, UA: 7 (ref 5.0–8.0)

## 2018-12-01 NOTE — Progress Notes (Signed)
ROB doing well. Feels good movement. Anticipatory guidance for 28 wk visit. She verbalizes and agrees to plan . Follow up 3 wks with Melody.  Body mass index is 19.34 kg/m.   Philip Aspen, CNM

## 2018-12-01 NOTE — Patient Instructions (Signed)
Glucose Tolerance Test During Pregnancy Why am I having this test? The glucose tolerance test (GTT) is done to check how your body processes sugar (glucose). This is one of several tests used to diagnose diabetes that develops during pregnancy (gestational diabetes mellitus). Gestational diabetes is a temporary form of diabetes that some women develop during pregnancy. It usually occurs during the second trimester of pregnancy and goes away after delivery. Testing (screening) for gestational diabetes usually occurs between 24 and 28 weeks of pregnancy. You may have the GTT test after having a 1-hour glucose screening test if the results from that test indicate that you may have gestational diabetes. You may also have this test if:  You have a history of gestational diabetes.  You have a history of giving birth to very large babies or have experienced repeated fetal loss (stillbirth).  You have signs and symptoms of diabetes, such as: ? Changes in your vision. ? Tingling or numbness in your hands or feet. ? Changes in hunger, thirst, and urination that are not otherwise explained by your pregnancy. What is being tested? This test measures the amount of glucose in your blood at different times during a period of 3 hours. This indicates how well your body is able to process glucose. What kind of sample is taken?  Blood samples are required for this test. They are usually collected by inserting a needle into a blood vessel. How do I prepare for this test?  For 3 days before your test, eat normally. Have plenty of carbohydrate-rich foods.  Follow instructions from your health care provider about: ? Eating or drinking restrictions on the day of the test. You may be asked to not eat or drink anything other than water (fast) starting 8-10 hours before the test. ? Changing or stopping your regular medicines. Some medicines may interfere with this test. Tell a health care provider about:  All  medicines you are taking, including vitamins, herbs, eye drops, creams, and over-the-counter medicines.  Any blood disorders you have.  Any surgeries you have had.  Any medical conditions you have. What happens during the test? First, your blood glucose will be measured. This is referred to as your fasting blood glucose, since you fasted before the test. Then, you will drink a glucose solution that contains a certain amount of glucose. Your blood glucose will be measured again 1, 2, and 3 hours after drinking the solution. This test takes about 3 hours to complete. You will need to stay at the testing location during this time. During the testing period:  Do not eat or drink anything other than the glucose solution.  Do not exercise.  Do not use any products that contain nicotine or tobacco, such as cigarettes and e-cigarettes. If you need help stopping, ask your health care provider. The testing procedure may vary among health care providers and hospitals. How are the results reported? Your results will be reported as milligrams of glucose per deciliter of blood (mg/dL) or millimoles per liter (mmol/L). Your health care provider will compare your results to normal ranges that were established after testing a large group of people (reference ranges). Reference ranges may vary among labs and hospitals. For this test, common reference ranges are:  Fasting: less than 95-105 mg/dL (5.3-5.8 mmol/L).  1 hour after drinking glucose: less than 180-190 mg/dL (10.0-10.5 mmol/L).  2 hours after drinking glucose: less than 155-165 mg/dL (8.6-9.2 mmol/L).  3 hours after drinking glucose: 140-145 mg/dL (7.8-8.1 mmol/L). What do the   results mean? Results within reference ranges are considered normal, meaning that your glucose levels are well-controlled. If two or more of your blood glucose levels are high, you may be diagnosed with gestational diabetes. If only one level is high, your health care  provider may suggest repeat testing or other tests to confirm a diagnosis. Talk with your health care provider about what your results mean. Questions to ask your health care provider Ask your health care provider, or the department that is doing the test:  When will my results be ready?  How will I get my results?  What are my treatment options?  What other tests do I need?  What are my next steps? Summary  The glucose tolerance test (GTT) is one of several tests used to diagnose diabetes that develops during pregnancy (gestational diabetes mellitus). Gestational diabetes is a temporary form of diabetes that some women develop during pregnancy.  You may have the GTT test after having a 1-hour glucose screening test if the results from that test indicate that you may have gestational diabetes. You may also have this test if you have any symptoms or risk factors for gestational diabetes.  Talk with your health care provider about what your results mean. This information is not intended to replace advice given to you by your health care provider. Make sure you discuss any questions you have with your health care provider. Document Released: 11/18/2011 Document Revised: 09/09/2018 Document Reviewed: 12/29/2016 Elsevier Patient Education  2020 [employer]  

## 2018-12-01 NOTE — Addendum Note (Signed)
Addended by: Raliegh Ip on: 12/01/2018 02:45 PM   Modules accepted: Orders

## 2018-12-02 ENCOUNTER — Encounter: Payer: Self-pay | Admitting: Certified Nurse Midwife

## 2018-12-02 ENCOUNTER — Telehealth: Payer: Self-pay | Admitting: Certified Nurse Midwife

## 2018-12-02 NOTE — Telephone Encounter (Signed)
Telephone to patient's mother per patient's request. Full name and date of birth verified.   Questions answered regarding weight gain in pregnancy. Encouraged Boost with each meal.   Advised possibility of growth ultrasound later in pregnancy if weight gain is less than desired.   Reviewed red flag symptoms and when to call.   Patient to follow up as previously scheduled or sooner if needed.    Diona Fanti, CNM [facility], [facility] 12/02/18 1:35 PM

## 2018-12-03 ENCOUNTER — Encounter: Payer: Self-pay | Admitting: Certified Nurse Midwife

## 2018-12-06 ENCOUNTER — Encounter: Payer: Self-pay | Admitting: Certified Nurse Midwife

## 2018-12-23 ENCOUNTER — Telehealth: Payer: Self-pay | Admitting: Certified Nurse Midwife

## 2018-12-23 NOTE — Telephone Encounter (Signed)
LMTRC

## 2018-12-23 NOTE — Telephone Encounter (Signed)
Spoke with patient, aware she can take Vitamin B6.  Patient also wanting [place] to be aware she noticed scant amount of vaginal bleeding when wiping after urinating today, no dysuria, denies pain, denies recent intercourse, no gushes of fluid, +FM.  Advised patient if flow becomes like a period to notify us or go to ER if after hours.  Patient verbalized understanding.

## 2018-12-23 NOTE — Telephone Encounter (Signed)
The patient called and stated that she wants to know if she is able to take vitamin B6. Please advise.

## 2018-12-28 ENCOUNTER — Other Ambulatory Visit: Payer: Self-pay

## 2018-12-28 ENCOUNTER — Other Ambulatory Visit: Payer: Medicaid Other

## 2018-12-28 ENCOUNTER — Ambulatory Visit (INDEPENDENT_AMBULATORY_CARE_PROVIDER_SITE_OTHER): Payer: Medicaid Other | Admitting: Obstetrics and Gynecology

## 2018-12-28 VITALS — BP 100/78 | HR 98 | Wt 108.9 lb

## 2018-12-28 DIAGNOSIS — Z3493 Encounter for supervision of normal pregnancy, unspecified, third trimester: Secondary | ICD-10-CM

## 2018-12-28 DIAGNOSIS — Z23 Encounter for immunization: Secondary | ICD-10-CM

## 2018-12-28 LAB — POCT URINALYSIS DIPSTICK OB
Bilirubin, UA: NEGATIVE
Blood, UA: NEGATIVE
Glucose, UA: NEGATIVE
Ketones, UA: 5
Leukocytes, UA: NEGATIVE
Nitrite, UA: NEGATIVE
POC,PROTEIN,UA: NEGATIVE
Spec Grav, UA: 1.015 (ref 1.010–1.025)
Urobilinogen, UA: 0.2 E.U./dL
pH, UA: 6 (ref 5.0–8.0)

## 2018-12-28 MED ORDER — TETANUS-DIPHTH-ACELL PERTUSSIS 5-2.5-18.5 LF-MCG/0.5 IM SUSP
0.5000 mL | Freq: Once | INTRAMUSCULAR | Status: AC
  Administered 2018-12-28: 09:00:00 0.5 mL via INTRAMUSCULAR

## 2018-12-28 NOTE — Progress Notes (Signed)
Patient seen with student and confirmed findings.  Will consider a growth scan after next visit if [place] lags.  Carmelle Bamberg,CNM

## 2018-12-28 NOTE — Patient Instructions (Signed)
Third Trimester of Pregnancy The third trimester is from week 28 through week 40 (months 7 through 9). The third trimester is a time when the unborn baby (fetus) is growing rapidly. At the end of the ninth month, the fetus is about 20 inches in length and weighs 6-10 pounds. Body changes during your third trimester Your body will continue to go through many changes during pregnancy. The changes vary from woman to woman. During the third trimester:  Your weight will continue to increase. You can expect to gain 25-35 pounds (11-16 kg) by the end of the pregnancy.  You may begin to get stretch marks on your hips, abdomen, and breasts.  You may urinate more often because the fetus is moving lower into your pelvis and pressing on your bladder.  You may develop or continue to have heartburn. This is caused by increased hormones that slow down muscles in the digestive tract.  You may develop or continue to have constipation because increased hormones slow digestion and cause the muscles that push waste through your intestines to relax.  You may develop hemorrhoids. These are swollen veins (varicose veins) in the rectum that can itch or be painful.  You may develop swollen, bulging veins (varicose veins) in your legs.  You may have increased body aches in the pelvis, back, or thighs. This is due to weight gain and increased hormones that are relaxing your joints.  You may have changes in your hair. These can include thickening of your hair, rapid growth, and changes in texture. Some women also have hair loss during or after pregnancy, or hair that feels dry or thin. Your hair will most likely return to normal after your baby is born.  Your breasts will continue to grow and they will continue to become tender. A yellow fluid (colostrum) may leak from your breasts. This is the first milk you are producing for your baby.  Your belly button may stick out.  You may notice more swelling in your hands,  face, or ankles.  You may have increased tingling or numbness in your hands, arms, and legs. The skin on your belly may also feel numb.  You may feel short of breath because of your expanding uterus.  You may have more problems sleeping. This can be caused by the size of your belly, increased need to urinate, and an increase in your body's metabolism.  You may notice the fetus "dropping," or moving lower in your abdomen (lightening).  You may have increased vaginal discharge.  You may notice your joints feel loose and you may have pain around your pelvic bone. What to expect at prenatal visits You will have prenatal exams every 2 weeks until week 36. Then you will have weekly prenatal exams. During a routine prenatal visit:  You will be weighed to make sure you and the baby are growing normally.  Your blood pressure will be taken.  Your abdomen will be measured to track your baby's growth.  The fetal heartbeat will be listened to.  Any test results from the previous visit will be discussed.  You may have a cervical check near your due date to see if your cervix has softened or thinned (effaced).  You will be tested for Group B streptococcus. This happens between 35 and 37 weeks. Your health care provider may ask you:  What your birth plan is.  How you are feeling.  If you are feeling the baby move.  If you have had any abnormal   symptoms, such as leaking fluid, bleeding, severe headaches, or abdominal cramping.  If you are using any tobacco products, including cigarettes, chewing tobacco, and electronic cigarettes.  If you have any questions. Other tests or screenings that may be performed during your third trimester include:  Blood tests that check for low iron levels (anemia).  Fetal testing to check the health, activity level, and growth of the fetus. Testing is done if you have certain medical conditions or if there are problems during the pregnancy.  Nonstress test  (NST). This test checks the health of your baby to make sure there are no signs of problems, such as the baby not getting enough oxygen. During this test, a belt is placed around your belly. The baby is made to move, and its heart rate is monitored during movement. What is false labor? False labor is a condition in which you feel small, irregular tightenings of the muscles in the womb (contractions) that usually go away with rest, changing position, or drinking water. These are called Braxton Hicks contractions. Contractions may last for hours, days, or even weeks before true labor sets in. If contractions come at regular intervals, become more frequent, increase in intensity, or become painful, you should see your health care provider. What are the signs of labor?  Abdominal cramps.  Regular contractions that start at 10 minutes apart and become stronger and more frequent with time.  Contractions that start on the top of the uterus and spread down to the lower abdomen and back.  Increased pelvic pressure and dull back pain.  A watery or bloody mucus discharge that comes from the vagina.  Leaking of amniotic fluid. This is also known as your "water breaking." It could be a slow trickle or a gush. Let your health care provider know if it has a color or strange odor. If you have any of these signs, call your health care provider right away, even if it is before your due date. Follow these instructions at home: Medicines  Follow your health care provider's instructions regarding medicine use. Specific medicines may be either safe or unsafe to take during pregnancy.  Take a prenatal vitamin that contains at least 600 micrograms (mcg) of folic acid.  If you develop constipation, try taking a stool softener if your health care provider approves. Eating and drinking   Eat a balanced diet that includes fresh fruits and vegetables, whole grains, good sources of protein such as meat, eggs, or tofu,  and low-fat dairy. Your health care provider will help you determine the amount of weight gain that is right for you.  Avoid raw meat and uncooked cheese. These carry germs that can cause birth defects in the baby.  If you have low calcium intake from food, talk to your health care provider about whether you should take a daily calcium supplement.  Eat four or five small meals rather than three large meals a day.  Limit foods that are high in fat and processed sugars, such as fried and sweet foods.  To prevent constipation: ? Drink enough fluid to keep your urine clear or pale yellow. ? Eat foods that are high in fiber, such as fresh fruits and vegetables, whole grains, and beans. Activity  Exercise only as directed by your health care provider. Most women can continue their usual exercise routine during pregnancy. Try to exercise for 30 minutes at least 5 days a week. Stop exercising if you experience uterine contractions.  Avoid heavy lifting.  Do   not exercise in extreme heat or humidity, or at high altitudes.  Wear low-heel, comfortable shoes.  Practice good posture.  You may continue to have sex unless your health care provider tells you otherwise. Relieving pain and discomfort  Take frequent breaks and rest with your legs elevated if you have leg cramps or low back pain.  Take warm sitz baths to soothe any pain or discomfort caused by hemorrhoids. Use hemorrhoid cream if your health care provider approves.  Wear a good support bra to prevent discomfort from breast tenderness.  If you develop varicose veins: ? Wear support pantyhose or compression stockings as told by your healthcare provider. ? Elevate your feet for 15 minutes, 3-4 times a day. Prenatal care  Write down your questions. Take them to your prenatal visits.  Keep all your prenatal visits as told by your health care provider. This is important. Safety  Wear your seat belt at all times when driving.  Make  a list of emergency phone numbers, including numbers for family, friends, the hospital, and police and fire departments. General instructions  Avoid cat litter boxes and soil used by cats. These carry germs that can cause birth defects in the baby. If you have a cat, ask someone to clean the litter box for you.  Do not travel far distances unless it is absolutely necessary and only with the approval of your health care provider.  Do not use hot tubs, steam rooms, or saunas.  Do not drink alcohol.  Do not use any products that contain nicotine or tobacco, such as cigarettes and e-cigarettes. If you need help quitting, ask your health care provider.  Do not use any medicinal herbs or unprescribed drugs. These chemicals affect the formation and growth of the baby.  Do not douche or use tampons or scented sanitary pads.  Do not cross your legs for long periods of time.  To prepare for the arrival of your baby: ? Take prenatal classes to understand, practice, and ask questions about labor and delivery. ? Make a trial run to the hospital. ? Visit the hospital and tour the maternity area. ? Arrange for maternity or paternity leave through employers. ? Arrange for family and friends to take care of pets while you are in the hospital. ? Purchase a rear-facing car seat and make sure you know how to install it in your car. ? Pack your hospital bag. ? Prepare the baby's nursery. Make sure to remove all pillows and stuffed animals from the baby's crib to prevent suffocation.  Visit your dentist if you have not gone during your pregnancy. Use a soft toothbrush to brush your teeth and be gentle when you floss. Contact a health care provider if:  You are unsure if you are in labor or if your water has broken.  You become dizzy.  You have mild pelvic cramps, pelvic pressure, or nagging pain in your abdominal area.  You have lower back pain.  You have persistent nausea, vomiting, or diarrhea.   You have an unusual or bad smelling vaginal discharge.  You have pain when you urinate. Get help right away if:  Your water breaks before 37 weeks.  You have regular contractions less than 5 minutes apart before 37 weeks.  You have a fever.  You are leaking fluid from your vagina.  You have spotting or bleeding from your vagina.  You have severe abdominal pain or cramping.  You have rapid weight loss or weight gain.  You have   shortness of breath with chest pain.  You notice sudden or extreme swelling of your face, hands, ankles, feet, or legs.  Your baby makes fewer than 10 movements in 2 hours.  You have severe headaches that do not go away when you take medicine.  You have vision changes. Summary  The third trimester is from week 28 through week 40, months 7 through 9. The third trimester is a time when the unborn baby (fetus) is growing rapidly.  During the third trimester, your discomfort may increase as you and your baby continue to gain weight. You may have abdominal, leg, and back pain, sleeping problems, and an increased need to urinate.  During the third trimester your breasts will keep growing and they will continue to become tender. A yellow fluid (colostrum) may leak from your breasts. This is the first milk you are producing for your baby.  False labor is a condition in which you feel small, irregular tightenings of the muscles in the womb (contractions) that eventually go away. These are called Braxton Hicks contractions. Contractions may last for hours, days, or even weeks before true labor sets in.  Signs of labor can include: abdominal cramps; regular contractions that start at 10 minutes apart and become stronger and more frequent with time; watery or bloody mucus discharge that comes from the vagina; increased pelvic pressure and dull back pain; and leaking of amniotic fluid. This information is not intended to replace advice given to you by your health  care provider. Make sure you discuss any questions you have with your health care provider. Document Released: 05/13/2001 Document Revised: 09/09/2018 Document Reviewed: 06/24/2016 Elsevier Patient Education  2020 [employer]  

## 2018-12-28 NOTE — Progress Notes (Signed)
Rob with c/o continued intermittent nausea without vomiting. Continuing Diclegis PRN, maintaining cool environment and drinking water helps she said. Discussed use of ginger and peppermint to help with nausea also. Discussed eating more protein in her diet. Pt fundal height measuring 26cm. Baby feels appropriate for gestational  size upon palpation. Birther is small in size and reports FOB the same. Will continue to monitor fundal height and consider [place] at next visit if continuing to measure below gestational age.  Silvestre Mesi, SNM

## 2018-12-28 NOTE — Progress Notes (Signed)
ROB- blood consent signed today, tdap given, pt is still having nausea taking diclegis

## 2018-12-30 ENCOUNTER — Other Ambulatory Visit: Payer: Medicaid Other

## 2019-01-04 ENCOUNTER — Other Ambulatory Visit: Payer: Self-pay

## 2019-01-04 ENCOUNTER — Other Ambulatory Visit: Payer: Medicaid Other

## 2019-01-04 DIAGNOSIS — Z113 Encounter for screening for infections with a predominantly sexual mode of transmission: Secondary | ICD-10-CM

## 2019-01-04 DIAGNOSIS — Z131 Encounter for screening for diabetes mellitus: Secondary | ICD-10-CM

## 2019-01-04 DIAGNOSIS — Z3493 Encounter for supervision of normal pregnancy, unspecified, third trimester: Secondary | ICD-10-CM

## 2019-01-04 DIAGNOSIS — Z369 Encounter for antenatal screening, unspecified: Secondary | ICD-10-CM

## 2019-01-04 DIAGNOSIS — Z130 Encounter for screening for diseases of the blood and blood-forming organs and certain disorders involving the immune mechanism: Secondary | ICD-10-CM

## 2019-01-04 NOTE — Progress Notes (Signed)
Lab orders placed.  

## 2019-01-05 ENCOUNTER — Other Ambulatory Visit: Payer: Self-pay | Admitting: Obstetrics and Gynecology

## 2019-01-05 DIAGNOSIS — O99019 Anemia complicating pregnancy, unspecified trimester: Secondary | ICD-10-CM | POA: Insufficient documentation

## 2019-01-05 LAB — CBC
Hematocrit: 28 % — ABNORMAL LOW (ref 34.0–46.6)
Hemoglobin: 9.4 g/dL — ABNORMAL LOW (ref 11.1–15.9)
MCH: 27.8 pg (ref 26.6–33.0)
MCHC: 33.6 g/dL (ref 31.5–35.7)
MCV: 83 fL (ref 79–97)
Platelets: 139 10*3/uL — ABNORMAL LOW (ref 150–450)
RBC: 3.38 x10E6/uL — ABNORMAL LOW (ref 3.77–5.28)
RDW: 12.1 % (ref 11.7–15.4)
WBC: 5 10*3/uL (ref 3.4–10.8)

## 2019-01-05 LAB — RPR: RPR Ser Ql: NONREACTIVE

## 2019-01-05 LAB — GLUCOSE, 1 HOUR GESTATIONAL: Gestational Diabetes Screen: 63 mg/dL — ABNORMAL LOW (ref 65–139)

## 2019-01-05 MED ORDER — CITRANATAL BLOOM 90-1 MG PO TABS: 1.0000 | ORAL_TABLET | Freq: Every day | ORAL | 11 refills | Status: DC

## 2019-01-11 ENCOUNTER — Ambulatory Visit (INDEPENDENT_AMBULATORY_CARE_PROVIDER_SITE_OTHER): Payer: Medicaid Other | Admitting: Certified Nurse Midwife

## 2019-01-11 ENCOUNTER — Other Ambulatory Visit: Payer: Self-pay

## 2019-01-11 VITALS — BP 95/53 | HR 100 | Wt 109.4 lb

## 2019-01-11 DIAGNOSIS — Z3A31 31 weeks gestation of pregnancy: Secondary | ICD-10-CM

## 2019-01-11 DIAGNOSIS — Z3493 Encounter for supervision of normal pregnancy, unspecified, third trimester: Secondary | ICD-10-CM

## 2019-01-11 DIAGNOSIS — O2613 Low weight gain in pregnancy, third trimester: Secondary | ICD-10-CM

## 2019-01-11 DIAGNOSIS — R102 Pelvic and perineal pain: Secondary | ICD-10-CM

## 2019-01-11 DIAGNOSIS — O26893 Other specified pregnancy related conditions, third trimester: Secondary | ICD-10-CM

## 2019-01-11 LAB — POCT URINALYSIS DIPSTICK OB
Bilirubin, UA: NEGATIVE
Blood, UA: NEGATIVE
Glucose, UA: NEGATIVE
Ketones, UA: NEGATIVE
Leukocytes, UA: NEGATIVE
Nitrite, UA: NEGATIVE
Spec Grav, UA: 1.02 (ref 1.010–1.025)
Urobilinogen, UA: 0.2 E.U./dL
pH, UA: 6 (ref 5.0–8.0)

## 2019-01-11 NOTE — Progress Notes (Signed)
ROB-Patient c/o intermittent pelvic pain x1 month.

## 2019-01-11 NOTE — Patient Instructions (Addendum)
Eating Plan for Pregnant Women °While you are pregnant, your body requires additional nutrition to help support your growing baby. You also have a higher need for some vitamins and minerals, such as folic acid, calcium, iron, and vitamin D. Eating a healthy, well-balanced diet is very important for your health and your baby's health. Your need for extra calories varies for the three 3-month segments of your pregnancy (trimesters). For most women, it is recommended to consume: °· 150 extra calories a day during the first trimester. °· 300 extra calories a day during the second trimester. °· 300 extra calories a day during the third trimester. °What are tips for following this plan? ° °· Do not try to lose weight or go on a diet during pregnancy. °· Limit your overall intake of foods that have "empty calories." These are foods that have little nutritional value, such as sweets, desserts, candies, and sugar-sweetened beverages. °· Eat a variety of foods (especially fruits and vegetables) to get a full range of vitamins and minerals. °· Take a prenatal vitamin to help meet your additional vitamin and mineral needs during pregnancy, specifically for folic acid, iron, calcium, and vitamin D. °· Remember to stay active. Ask your health care provider what types of exercise and activities are safe for you. °· Practice good food safety and cleanliness. Wash your hands before you eat and after you prepare raw meat. Wash all fruits and vegetables well before peeling or eating. Taking these actions can help to prevent food-borne illnesses that can be very dangerous to your baby, such as listeriosis. Ask your health care provider for more information about listeriosis. °What does 150 extra calories look like? °Healthy options that provide 150 extra calories each day could be any of the following: °· 6-8 oz (170-230 g) of plain low-fat yogurt with ½ cup of berries. °· 1 apple with 2 teaspoons (11 g) of peanut butter. °· Cut-up  vegetables with ¼ cup (60 g) of hummus. °· 8 oz (230 mL) or 1 cup of low-fat chocolate milk. °· 1 stick of string cheese with 1 medium orange. °· 1 peanut butter and jelly sandwich that is made with one slice of whole-wheat bread and 1 tsp (5 g) of peanut butter. °For 300 extra calories, you could eat two of those healthy options each day. °What is a healthy amount of weight to gain? °The right amount of weight gain for you is based on your BMI before you became pregnant. If your BMI: °· Was less than 18 (underweight), you should gain 28-40 lb (13-18 kg). °· Was 18-24.9 (normal), you should gain 25-35 lb (11-16 kg). °· Was 25-29.9 (overweight), you should gain 15-25 lb (7-11 kg). °· Was 30 or greater (obese), you should gain 11-20 lb (5-9 kg). °What if I am having twins or multiples? °Generally, if you are carrying twins or multiples: °· You may need to eat 300-600 extra calories a day. °· The recommended range for total weight gain is 25-54 lb (11-25 kg), depending on your BMI before pregnancy. °· Talk with your health care provider to find out about nutritional needs, weight gain, and exercise that is right for you. °What foods can I eat? ° °Grains °All grains. Choose whole grains, such as whole-wheat bread, oatmeal, or brown rice. °Vegetables °All vegetables. Eat a variety of colors and types of vegetables. Remember to wash your vegetables well before peeling or eating. °Fruits °All fruits. Eat a variety of colors and types of fruit. Remember to wash   your fruits well before peeling or eating. °Meats and other protein foods °Lean meats, including chicken, turkey, fish, and lean cuts of beef, veal, or pork. If you eat fish or seafood, choose options that are higher in omega-3 fatty acids and lower in mercury, such as salmon, herring, mussels, trout, sardines, pollock, shrimp, crab, and lobster. Tofu. Tempeh. Beans. Eggs. Peanut butter and other nut butters. Make sure that all meats, poultry, and eggs are cooked to  food-safe temperatures or "well-done." °Two or more servings of fish are recommended each week in order to get the most benefits from omega-3 fatty acids that are found in seafood. Choose fish that are lower in mercury. You can find more information online: °· www.fda.gov °Dairy °Pasteurized milk and milk alternatives (such as almond milk). Pasteurized yogurt and pasteurized cheese. Cottage cheese. Sour cream. °Beverages °Water. Juices that contain 100% fruit juice or vegetable juice. Caffeine-free teas and decaffeinated coffee. °Drinks that contain caffeine are okay to drink, but it is better to avoid caffeine. Keep your total caffeine intake to less than 200 mg each day (which is 12 oz or 355 mL of coffee, tea, or soda) or the limit as told by your health care provider. °Fats and oils °Fats and oils are okay to include in moderation. °Sweets and desserts °Sweets and desserts are okay to include in moderation. °Seasoning and other foods °All pasteurized condiments. °The items listed above may not be a complete list of recommended foods and beverages. Contact your dietitian for more options. °The items listed above may not be a complete list of foods and beverages [you/your child] can eat. Contact a dietitian for more information. °What foods are not recommended? °Vegetables °Raw (unpasteurized) vegetable juices. °Fruits °Unpasteurized fruit juices. °Meats and other protein foods °Lunch meats, bologna, hot dogs, or other deli meats. (If you must eat those meats, reheat them until they are steaming hot.) Refrigerated paté, meat spreads from a meat counter, smoked seafood that is found in the refrigerated section of a store. Raw or undercooked meats, poultry, and eggs. Raw fish, such as sushi or sashimi. Fish that have high mercury content, such as tilefish, shark, swordfish, and king mackerel. °To learn more about mercury in fish, talk with your health care provider or look for online resources, such  as: °· www.fda.gov °Dairy °Raw (unpasteurized) milk and any foods that have raw milk in them. Soft cheeses, such as feta, queso blanco, queso fresco, Brie, Camembert cheeses, blue-veined cheeses, and Panela cheese (unless it is made with pasteurized milk, which must be stated on the label). °Beverages °Alcohol. Sugar-sweetened beverages, such as sodas, teas, or energy drinks. °Seasoning and other foods °Homemade fermented foods and drinks, such as pickles, sauerkraut, or kombucha drinks. (Store-bought pasteurized versions of these are okay.) °Salads that are made in a store or deli, such as ham salad, chicken salad, egg salad, tuna salad, and seafood salad. °The items listed above may not be a complete list of foods and beverages to avoid. Contact your dietitian for more information. °The items listed above may not be a complete list of foods and beverages [you/your child] should avoid. Contact a dietitian for more information. °Where to find more information °To calculate the number of calories you need based on your height, weight, and activity level, you can use an online calculator such as: °· www.choosemyplate.gov/MyPlatePlan °To calculate how much weight you should gain during pregnancy, you can use an online pregnancy weight gain calculator such as: °· www.choosemyplate.gov/pregnancy-weight-gain-calculator °Summary °· While you   are pregnant, your body requires additional nutrition to help support your growing baby.  Eat a variety of foods, especially fruits and vegetables to get a full range of vitamins and minerals.  Practice good food safety and cleanliness. Wash your hands before you eat and after you prepare raw meat. Wash all fruits and vegetables well before peeling or eating. Taking these actions can help to prevent food-borne illnesses, such as listeriosis, that can be very dangerous to your baby.  Do not eat raw meat or fish. Do not eat fish that have high mercury content, such as tilefish,  shark, swordfish, and king mackerel. Do not eat unpasteurized (raw) dairy.  Take a prenatal vitamin to help meet your additional vitamin and mineral needs during pregnancy, specifically for folic acid, iron, calcium, and vitamin D. This information is not intended to replace advice given to you by your health care provider. Make sure you discuss any questions you have with your health care provider. Document Released: 03/03/2014 Document Revised: 09/09/2018 Document Reviewed: 02/13/2017 Elsevier Patient Education  2020 [employer] Fetal Movement Counts Patient Name: ________________________________________________ Patient Due Date: ____________________ What is a fetal movement count?  A fetal movement count is the number of times that you feel your baby move during a certain amount of time. This may also be called a fetal kick count. A fetal movement count is recommended for every pregnant woman. You may be asked to start counting fetal movements as early as week 28 of your pregnancy. Pay attention to when your baby is most active. You may notice your baby's sleep and wake cycles. You may also notice things that make your baby move more. You should do a fetal movement count:  When your baby is normally most active.  At the same time each day. A good time to count movements is while you are resting, after having something to eat and drink. How do I count fetal movements? 1. Find a quiet, comfortable area. Sit, or lie down on your side. 2. Write down the date, the start time and stop time, and the number of movements that you felt between those two times. Take this information with you to your health care visits. 3. For 2 hours, count kicks, flutters, swishes, rolls, and jabs. You should feel at least 10 movements during 2 hours. 4. You may stop counting after you have felt 10 movements. 5. If you do not feel 10 movements in 2 hours, have something to eat and drink. Then, keep resting and  counting for 1 hour. If you feel at least 4 movements during that hour, you may stop counting. Contact a health care provider if:  You feel fewer than 4 movements in 2 hours.  Your baby is not moving like he or she usually does. Date: ____________ Start time: ____________ Stop time: ____________ Movements: ____________ Date: ____________ Start time: ____________ Stop time: ____________ Movements: ____________ Date: ____________ Start time: ____________ Stop time: ____________ Movements: ____________ Date: ____________ Start time: ____________ Stop time: ____________ Movements: ____________ Date: ____________ Start time: ____________ Stop time: ____________ Movements: ____________ Date: ____________ Start time: ____________ Stop time: ____________ Movements: ____________ Date: ____________ Start time: ____________ Stop time: ____________ Movements: ____________ Date: ____________ Start time: ____________ Stop time: ____________ Movements: ____________ Date: ____________ Start time: ____________ Stop time: ____________ Movements: ____________ This information is not intended to replace advice given to you by your health care provider. Make sure you discuss any questions you have with your health care provider. Document Released: 06/18/2006  Document Revised: 06/08/2018 Document Reviewed: 06/28/2015 Elsevier Patient Education  2020 [employer].

## 2019-01-11 NOTE — Progress Notes (Signed)
ROB-Reports intermittent pelvic pain x 1 month, worse with fetal movement. Discussed home treatment measures. Referral to PT, see orders. TWG: 9 pounds. Prenatal vitamin samples given. Anticipatory guidance regarding course of prenatal care. Reviewed red flag symptoms and when to call. RTC x 2 weeks for growth ultrasound and ROB or sooner if needed.   Meeting with [employer], [place] during visit. Assisted with enrolling in [facility] Breastfeeding Classes

## 2019-01-15 ENCOUNTER — Other Ambulatory Visit: Payer: Self-pay

## 2019-01-15 ENCOUNTER — Observation Stay
Admission: EM | Admit: 2019-01-15 | Discharge: 2019-01-16 | Disposition: A | Discharge status: Home / Self Care | Payer: Medicaid Other | Attending: Certified Nurse Midwife | Admitting: Certified Nurse Midwife

## 2019-01-15 ENCOUNTER — Encounter: Payer: Self-pay | Admitting: Certified Nurse Midwife

## 2019-01-15 DIAGNOSIS — N898 Other specified noninflammatory disorders of vagina: Secondary | ICD-10-CM | POA: Diagnosis not present

## 2019-01-15 DIAGNOSIS — Z3A32 32 weeks gestation of pregnancy: Secondary | ICD-10-CM | POA: Diagnosis not present

## 2019-01-15 DIAGNOSIS — O26893 Other specified pregnancy related conditions, third trimester: Principal | ICD-10-CM | POA: Insufficient documentation

## 2019-01-15 LAB — RUPTURE OF MEMBRANE (ROM)PLUS: Rom Plus: NEGATIVE

## 2019-01-15 MED ORDER — TERBUTALINE SULFATE 1 MG/ML IJ SOLN
0.2500 mg | Freq: Once | INTRAMUSCULAR | Status: AC
  Administered 2019-01-16: 0.25 mg via SUBCUTANEOUS
  Filled 2019-01-15: qty 1

## 2019-01-15 MED ORDER — LACTATED RINGERS IV SOLN
INTRAVENOUS | Status: DC
  Administered 2019-01-15: via INTRAVENOUS

## 2019-01-15 MED ORDER — LACTATED RINGERS IV SOLN
500.0000 mL | INTRAVENOUS | Status: DC | PRN
  Administered 2019-01-15: 1000 mL via INTRAVENOUS

## 2019-01-15 MED ORDER — ACETAMINOPHEN 325 MG PO TABS: 650.0000 mg | ORAL_TABLET | ORAL | Status: DC | PRN

## 2019-01-15 MED ORDER — SOD CITRATE-CITRIC ACID 500-334 MG/5ML PO SOLN: 30.0000 mL | ORAL | Status: DC | PRN

## 2019-01-15 NOTE — OB Triage Note (Signed)
Pt reports "gush " of clear fluid at 0630 this am 01/15/2019. Normal fetal movement.  Pt reports spotting.

## 2019-01-16 DIAGNOSIS — Z3A32 32 weeks gestation of pregnancy: Secondary | ICD-10-CM

## 2019-01-16 DIAGNOSIS — O26893 Other specified pregnancy related conditions, third trimester: Secondary | ICD-10-CM

## 2019-01-16 DIAGNOSIS — N898 Other specified noninflammatory disorders of vagina: Secondary | ICD-10-CM

## 2019-01-16 LAB — FETAL FIBRONECTIN: Fetal Fibronectin: NEGATIVE

## 2019-01-16 NOTE — OB Triage Note (Addendum)
  L&D OB Triage Note  SUBJECTIVE Yolanda Garcia is a 23 y.o. G1P0 female at [redacted]w[redacted]d, EDD Estimated Date of Delivery: 03/12/19 who presented to triage with complaints of . Leaking of fluid this morning . Feels good movement. Spotting noted.   OB History  Gravida Para Term Preterm AB Living  1 0 0 0 0 0  SAB TAB Ectopic Multiple Live Births  0 0 0 0 0    # Outcome Date GA Lbr Len/2nd Weight Sex Delivery Anes PTL Lv  1 Current             Medications Prior to Admission  Medication Sig Dispense Refill Last Dose  . DICLEGIS 10-10 MG TBEC Take 1 tablet by mouth 2 (two) times daily. 60 tablet 2 01/14/2019 at Unknown time  . Prenatal-DSS-FeCb-FeGl-FA (CITRANATAL BLOOM) 90-1 MG TABS Take 1 tablet by mouth daily. 30 tablet 11 Past Month at Unknown time     OBJECTIVE  Nursing Evaluation:   BP (!) 103/51   Pulse 90   LMP 06/02/2018 (Exact Date)    Findings:   Reactive NST, ROM plus negative  NST was performed and has been reviewed by me.  NST INTERPRETATION: Category I  Mode: External Baseline Rate (A): 135 bpm(FHT) Variability: Moderate Accelerations: 15 x 15 Decelerations: None     Contraction Frequency (min): 1-3  ASSESSMENT Impression:  1.  Pregnancy:  G1P0 at [redacted]w[redacted]d , EDD Estimated Date of Delivery: 03/12/19 2.  NST:  Category I  3.contractions that resolved with IV hydration and terbutaline 4. ROM plus negative 5.Fetal Fibronectin negative  PLAN 1. Reassurance given, pre term labor precautions reviweed 2. Discharge home with standard labor precautions given to return to L&D or call the office for problems. 3. Continue routine prenatal care.    Philip Aspen, CNM

## 2019-01-19 ENCOUNTER — Ambulatory Visit: Payer: Medicaid Other | Attending: Certified Nurse Midwife | Admitting: Physical Therapy

## 2019-01-19 ENCOUNTER — Other Ambulatory Visit: Payer: Self-pay

## 2019-01-19 ENCOUNTER — Encounter: Payer: Self-pay | Admitting: Physical Therapy

## 2019-01-19 DIAGNOSIS — R2689 Other abnormalities of gait and mobility: Secondary | ICD-10-CM | POA: Diagnosis present

## 2019-01-19 DIAGNOSIS — M62838 Other muscle spasm: Secondary | ICD-10-CM | POA: Diagnosis present

## 2019-01-19 DIAGNOSIS — M6281 Muscle weakness (generalized): Secondary | ICD-10-CM | POA: Diagnosis present

## 2019-01-19 NOTE — Addendum Note (Signed)
Addended by: Jerl Mina on: 01/19/2019 05:52 PM   Modules accepted: Orders

## 2019-01-19 NOTE — Patient Instructions (Addendum)
(  [phone] [id number]. [facility]plain Services if you need support for future anxiety attacks   ___    Clam Shell 45 Degrees for strengthening    Lying with hips and knees bent 45, one pillow between knees and ankles. Lift knee with exhale. Be sure pelvis does not roll backward. Do not arch back. Do 10 times, each leg, 2 times per day.     Complimentary stretch: [employer] _ foot over _ thigh, opposite knee straight  3 breaths    _____  Breathing technique to push baby during labor  _ inhale, exhale and feel abdominal wall hug in to push down  NOT crunching at rib in  ____  Eat more veggies for less constipation  _____    Thread the needle modified  - only with R hand on L thigh this week, but then both sides next week    Hands on the table edge Mini squat, knees bent, butt back   Slide R hand on L outer thigh  , look under L armpit   5 reps each time  ____   STAND on both legs not hip to the side on one leg  To minimize tightness on R low back

## 2019-01-19 NOTE — Therapy (Addendum)
[place] MAIN [facility] SERVICES [address] [place], [place], [address] Phone: [phone]   Fax:  [phone]  Physical Therapy Evaluation  Patient Details  Name: Yolanda Garcia MRN: [id number] Date of Birth: Oct 09, 1995 Referring Provider (PT): Lucretia Kern    Encounter Date: 01/19/2019  PT End of Session - 01/19/19 1021    Visit Number  1    Number of Visits  4    Date for PT Re-Evaluation  02/16/19    PT Start Time  0915    PT Stop Time  1021    PT Time Calculation (min)  66 min    Activity Tolerance  Patient tolerated treatment well       Past Medical History:  Diagnosis Date  . Panic attack     History reviewed. No pertinent surgical history.  There were no vitals filed for this visit.   Subjective Assessment - 01/19/19 0924    Subjective  Pt reported R LBP/ upper back pain since 1st trimester of pregnancy, Pt is 21 week with her 1st child. Pain is at 9/10 at both areas. Sometimes R hip goes numb. Eases quickly.   Worst with sleeping and riding in the car too long. Eases to a 3/10 with hot baths.   2) constipation: Last BM on 01/15/19.  Once every week. No straining.  Sitting for a long time.  Drink 2-3 bottle ( 16 fl oz). Pt does not eat that much vegetables.    Pertinent History  Pt had contractions on 8/14-15/20 with urge to push with gush of water and pain.Pt went to the ER and they gave shot in her arm to stop the contractions and was hydrated with IV and made sure her water did not break.   Denied falls on tailbone, no SUI.    Patient Stated Goals  Stop the pain at the low back and upper back         [facility] PT Assessment - 01/19/19 0940      Assessment   Medical Diagnosis  pelvic pain affecting 3rd trimester of pregnancy     Referring Provider (PT)  Verdene RioArnoldo Morale       Precautions   Precautions  Other (comment)   pregnancy     Restrictions   Weight Bearing Restrictions  No      Balance Screen   Has the  patient fallen in the past 6 months  No      Observation/Other Assessments   Observations  ankles crossed under chair      Coordination   Gross Motor Movements are Fluid and Coordinated  --   minor cues to correct chest breathing      Posture/Postural Control   Posture Comments  proper diaphragmatic excursion       Strength   Overall Strength Comments  hip abd 3/5 B      Palpation   Spinal mobility  R posterior rotation of thorax limited rotation to R 25%, L 60%, post Tx: B 60%      SI assessment   levelled iliac crest in standing / supine    Palpation comment  increased tightness at R QL, parapsinal thoracic/ lumbar, medial scapular mm R      Bed Mobility   Bed Mobility  --   crunch method     Ambulation/Gait   Gait velocity  0.8 m/s pre Tx, 1.12 m/s post Tx     Gait Comments  slowed gait  pre Tx, post  Tx: increased trunk rotation , longer stride length                 Objective measurements completed on examination: See above findings.      [facility] - 01/19/19 0940      Exercises   Exercises  --   see pt instructions to decrease mm tensions.     Modalities   Modalities  Moist Heat      Moist Heat Therapy   Number Minutes Moist Heat  5 Minutes    Moist Heat Location  --   thoracic/ lumbar      Manual Therapy   Manual therapy comments   R QL, parapsinal thoracic/ lumbar, medial scapular mm STM/MWM                PT Short Term Goals - 01/19/19 1737      PT SHORT TERM GOAL #1   Title  Pt will demo with decreased R paraspinal/ QL mm tensions in order to stand w/ less pain    Baseline  increased R paraspinal/ QL mm tensions in order to walk and stand w/ less pain    Time  2    Period  Weeks    Status  Achieved    Target Date  02/02/19      PT SHORT TERM GOAL #2   Title  Pt will demo increased hip strength abduction > 4+/5 B in order to walk with less pain    Baseline  3/5 B    Time  4    Period  Weeks    Status   New    Target Date  02/16/19      PT SHORT TERM GOAL #3   Title  Pt will increase gait speed to > 1.2 in order to cross road safely while pregnant    Baseline  0.8 m/s    Time  3    Period  Weeks    Status  New    Target Date  02/09/19      PT SHORT TERM GOAL #4   Title  Pt will demo no cues for equal weight bearing when standing to minmize pain    Baseline  prefers to stand on RLE, hip shifted    Time  2    Period  Weeks    Status  New    Target Date  02/02/19      PT SHORT TERM GOAL #5   Title  Pt will demo proper breathing technique for pushing during labor to minimize straining pelvic floor / risks for pelvic floor dysfunctions    Time  2    Period  Weeks    Status  New    Target Date  02/02/19                Plan - 01/19/19 1735    Clinical Impression Statement  Pt is a 23 yo female who is in her 3rd trimester of her 1st pregnancy. Pt reports experiencing upper back and low back pain that started during her pregnancy and also experiences constipation. Pt 's clinical presentations include increased R sided spinal mm tensions, poor body mechanics and posture, hypermobility, slowed gait, and hip weakness. Suspect pt's preferred standing position with R hip sway/ unequal weight bearing on R LE is contributing to asymmetrical mm tensions and her Sx of pain at upper/ lower spine.  Following Tx today, pt demo'd decreased back mm tensions, increased gait  speed with more thoracic/ pelvic mobility. Pt demo'd proper breathing technique to apply when pushing during labor to minimize pelvic floor dysfunction. Pt was provided blanket to decrease c/o feeling cold, water and crackers as pt reported she had not eaten breakfast yet, and provided heat after manual Tx to minimize mm soreness .Plan to progress to deep core/ thoracolumbar strengthening at upcoming sessions.    Examination-Activity Limitations  Sit;Stand;Locomotion Level;Sleep    Stability/Clinical Decision Making   Stable/Uncomplicated    Clinical Decision Making  Low    Rehab Potential  Good    PT Frequency  1x / week    PT Duration  4 weeks    PT Treatment/Interventions  Gait training;Stair training;Neuromuscular re-education;Balance training;Therapeutic exercise;Therapeutic activities;Manual lymph drainage;Manual techniques;Patient/family education;Moist Heat;Taping;Spinal Manipulations;ADLs/Self Care Home Management;Functional mobility training    Consulted and Agree with Plan of Care  Patient       Patient will benefit from skilled therapeutic intervention in order to improve the following deficits and impairments:  Difficulty walking, Decreased range of motion, Decreased knowledge of precautions, Decreased strength, Decreased mobility, Decreased endurance, Decreased activity tolerance, Increased muscle spasms, Improper body mechanics, Postural dysfunction, Pain  Visit Diagnosis: 1. Other muscle spasm   2. Muscle weakness (generalized)   3. Other abnormalities of gait and mobility        Problem List Patient Active Problem List   Diagnosis Date Noted  . Labor and delivery, indication for care 01/15/2019  . Anemia affecting first pregnancy 01/05/2019  . Sickle cell trait (HCC) 08/23/2018    Mariane MastersYeung,Shin Yiing ,PT, DPT, E-RYT  01/19/2019, 5:46 PM  [place] [facility] [address] [place], [place], [id number] Phone: [phone]   Fax:  [phone]  Name: Florina OuKaela Gardenhire MRN: [id number] Date of Birth: [birth year]

## 2019-01-21 ENCOUNTER — Encounter: Payer: Self-pay | Admitting: *Deleted

## 2019-01-21 ENCOUNTER — Other Ambulatory Visit: Payer: Self-pay

## 2019-01-21 ENCOUNTER — Observation Stay
Admission: EM | Admit: 2019-01-21 | Discharge: 2019-01-21 | Disposition: A | Discharge status: Home / Self Care | Payer: Medicaid Other | Attending: Obstetrics and Gynecology | Admitting: Obstetrics and Gynecology

## 2019-01-21 DIAGNOSIS — Z3A33 33 weeks gestation of pregnancy: Secondary | ICD-10-CM | POA: Diagnosis not present

## 2019-01-21 DIAGNOSIS — O99613 Diseases of the digestive system complicating pregnancy, third trimester: Principal | ICD-10-CM | POA: Insufficient documentation

## 2019-01-21 DIAGNOSIS — R109 Unspecified abdominal pain: Secondary | ICD-10-CM

## 2019-01-21 DIAGNOSIS — K5900 Constipation, unspecified: Secondary | ICD-10-CM | POA: Diagnosis not present

## 2019-01-21 DIAGNOSIS — O26893 Other specified pregnancy related conditions, third trimester: Secondary | ICD-10-CM

## 2019-01-21 MED ORDER — FLEET ENEMA 7-19 GM/118ML RE ENEM
1.0000 | ENEMA | Freq: Once | RECTAL | Status: AC
  Administered 2019-01-21: 1 via RECTAL

## 2019-01-21 NOTE — OB Triage Note (Signed)
Pt. C/O lower abdominal pain since 0400 this am. Also complaining of rectal pressure. Unsure of last bowel movement. Denies gush of fluid, vaginal bleeding. Yolanda Garcia

## 2019-01-21 NOTE — Discharge Instructions (Signed)
Take Miralax every other night until regular bowel movement pattern.

## 2019-01-24 NOTE — Discharge Summary (Signed)
Antenatal Physician Discharge Summary  Patient ID: Yolanda Garcia MRN: [id number] DOB/AGE: 11/26/1995 23 y.o.  Admit date: 01/21/2019 Discharge date: 01/24/2019  Admission Diagnoses:  Discharge Diagnoses:   Prenatal Procedures: ultrasound  Consults: Neonatology, Maternal Fetal Medicine  Significant Diagnostic Studies:  No results found for this or any previous visit (from the past 168 hour(s)).  Treatments: analgesia: acetaminophen and saline enema  Hospital Course:  This is a 23 y.o. G1P0 with IUP at [redacted]w[redacted]d admitted for abdominal pain and severe constipation with rectal pressure (no BM in 7 days). She was admitted with contractions, noted to have a cervical exam of c/th/oop.  No leaking of fluid and no bleeding.    She was observed, fetal heart rate monitoring remained reassuring, and she had no signs/symptoms of preterm labor or other maternal-fetal concerns.  Her cervical exam was unchanged from admission.  She was deemed stable for discharge to home with outpatient follow up. She had good results after saline enema given  Discharge Exam: BP (!) 101/55 (BP Location: Left Arm)   Pulse (!) 103   Temp 98.5 F (36.9 C) (Oral)   Resp 16   Ht 5\' 6"  (1.676 m)   Wt 49.4 kg   LMP 06/02/2018 (Exact Date)   BMI 17.59 kg/m  General appearance: alert, cooperative, appears stated age and moderate distress GI: soft, non-tender; bowel sounds normal; no masses,  no organomegaly  Discharge Condition: Stable  Disposition: severe constipation- instructed to start Miralax every other night for remainder of pregnancy   Allergies as of 01/21/2019   No Known Allergies     Medication List    ASK your doctor about these medications   CitraNatal Bloom 90-1 MG Tabs Take 1 tablet by mouth daily.   Diclegis 10-10 MG Tbec Generic drug: Doxylamine-Pyridoxine Take 1 tablet by mouth 2 (two) times daily.        [place]

## 2019-01-25 ENCOUNTER — Other Ambulatory Visit: Payer: Self-pay

## 2019-01-25 ENCOUNTER — Ambulatory Visit (INDEPENDENT_AMBULATORY_CARE_PROVIDER_SITE_OTHER): Payer: Medicaid Other | Admitting: Certified Nurse Midwife

## 2019-01-25 ENCOUNTER — Ambulatory Visit (INDEPENDENT_AMBULATORY_CARE_PROVIDER_SITE_OTHER): Payer: Medicaid Other

## 2019-01-25 ENCOUNTER — Other Ambulatory Visit (HOSPITAL_COMMUNITY)
Admission: RE | Admit: 2019-01-25 | Discharge: 2019-01-25 | Disposition: A | Discharge status: Home / Self Care | Payer: Medicaid Other | Source: Ambulatory Visit | Attending: Certified Nurse Midwife | Admitting: Certified Nurse Midwife

## 2019-01-25 VITALS — BP 115/55 | HR 86 | Wt 111.1 lb

## 2019-01-25 DIAGNOSIS — Z3493 Encounter for supervision of normal pregnancy, unspecified, third trimester: Secondary | ICD-10-CM | POA: Insufficient documentation

## 2019-01-25 DIAGNOSIS — R102 Pelvic and perineal pain: Secondary | ICD-10-CM

## 2019-01-25 DIAGNOSIS — O26899 Other specified pregnancy related conditions, unspecified trimester: Secondary | ICD-10-CM

## 2019-01-25 DIAGNOSIS — N898 Other specified noninflammatory disorders of vagina: Secondary | ICD-10-CM | POA: Insufficient documentation

## 2019-01-25 DIAGNOSIS — O219 Vomiting of pregnancy, unspecified: Secondary | ICD-10-CM

## 2019-01-25 DIAGNOSIS — O2613 Low weight gain in pregnancy, third trimester: Secondary | ICD-10-CM

## 2019-01-25 DIAGNOSIS — Z3A33 33 weeks gestation of pregnancy: Secondary | ICD-10-CM

## 2019-01-25 DIAGNOSIS — O26893 Other specified pregnancy related conditions, third trimester: Secondary | ICD-10-CM

## 2019-01-25 DIAGNOSIS — O99613 Diseases of the digestive system complicating pregnancy, third trimester: Secondary | ICD-10-CM

## 2019-01-25 DIAGNOSIS — K5900 Constipation, unspecified: Secondary | ICD-10-CM

## 2019-01-25 LAB — POCT URINALYSIS DIPSTICK OB
Bilirubin, UA: NEGATIVE
Blood, UA: NEGATIVE
Glucose, UA: NEGATIVE
Ketones, UA: NEGATIVE
Nitrite, UA: NEGATIVE
POC,PROTEIN,UA: NEGATIVE
Spec Grav, UA: 1.02 (ref 1.010–1.025)
Urobilinogen, UA: 0.2 E.U./dL
pH, UA: 6 (ref 5.0–8.0)

## 2019-01-25 MED ORDER — POLYETHYLENE GLYCOL 3350 17 GM/SCOOP PO POWD: 1.0000 | Freq: Once | ORAL | 0 refills | Status: AC

## 2019-01-25 NOTE — Patient Instructions (Addendum)
Fetal Movement Counts Patient Name: ________________________________________________ Patient Due Date: ____________________ What is a fetal movement count?  A fetal movement count is the number of times that you feel your baby move during a certain amount of time. This may also be called a fetal kick count. A fetal movement count is recommended for every pregnant woman. You may be asked to start counting fetal movements as early as week 28 of your pregnancy. Pay attention to when your baby is most active. You may notice your baby's sleep and wake cycles. You may also notice things that make your baby move more. You should do a fetal movement count:  When your baby is normally most active.  At the same time each day. A good time to count movements is while you are resting, after having something to eat and drink. How do I count fetal movements? 1. Find a quiet, comfortable area. Sit, or lie down on your side. 2. Write down the date, the start time and stop time, and the number of movements that you felt between those two times. Take this information with you to your health care visits. 3. For 2 hours, count kicks, flutters, swishes, rolls, and jabs. You should feel at least 10 movements during 2 hours. 4. You may stop counting after you have felt 10 movements. 5. If you do not feel 10 movements in 2 hours, have something to eat and drink. Then, keep resting and counting for 1 hour. If you feel at least 4 movements during that hour, you may stop counting. Contact a health care provider if:  You feel fewer than 4 movements in 2 hours.  Your baby is not moving like he or she usually does. Date: ____________ Start time: ____________ Stop time: ____________ Movements: ____________ Date: ____________ Start time: ____________ Stop time: ____________ Movements: ____________ Date: ____________ Start time: ____________ Stop time: ____________ Movements: ____________ Date: ____________ Start time:  ____________ Stop time: ____________ Movements: ____________ Date: ____________ Start time: ____________ Stop time: ____________ Movements: ____________ Date: ____________ Start time: ____________ Stop time: ____________ Movements: ____________ Date: ____________ Start time: ____________ Stop time: ____________ Movements: ____________ Date: ____________ Start time: ____________ Stop time: ____________ Movements: ____________ Date: ____________ Start time: ____________ Stop time: ____________ Movements: ____________ This information is not intended to replace advice given to you by your health care provider. Make sure you discuss any questions you have with your health care provider. Document Released: 06/18/2006 Document Revised: 06/08/2018 Document Reviewed: 06/28/2015 Elsevier Patient Education  [place].  Morning Sickness  Morning sickness is when a woman feels nauseous during pregnancy. This nauseous feeling may or may not come with vomiting. It often occurs in the morning, but it can be a problem at any time of day. Morning sickness is most common during the first trimester. In some cases, it may continue throughout pregnancy. Although morning sickness is unpleasant, it is usually harmless unless the woman develops severe and continual vomiting (hyperemesis gravidarum), a condition that requires more intense treatment. What are the causes? The exact cause of this condition is not known, but it seems to be related to normal hormonal changes that occur in pregnancy. What increases the risk? You are more likely to develop this condition if:  You experienced nausea or vomiting before your pregnancy.  You had morning sickness during a previous pregnancy.  You are pregnant with more than one baby, such as twins. What are the signs or symptoms? Symptoms of this condition include:  Nausea.  Vomiting. How  is this diagnosed? This condition is usually diagnosed based on your signs  and symptoms. How is this treated? In many cases, treatment is not needed for this condition. Making some changes to what you eat may help to control symptoms. Your health care provider may also prescribe or recommend:  Vitamin B6 supplements.  Anti-nausea medicines.  Ginger. Follow these instructions at home: Medicines  Take over-the-counter and prescription medicines only as told by your health care provider. Do not use any prescription, over-the-counter, or herbal medicines for morning sickness without first talking with your health care provider.  Taking multivitamins before getting pregnant can prevent or decrease the severity of morning sickness in most women. Eating and drinking  Eat a piece of dry toast or crackers before getting out of bed in the morning.  Eat 5 or 6 small meals a day.  Eat dry and bland foods, such as rice or a baked potato. Foods that are high in carbohydrates are often helpful.  Avoid greasy, fatty, and spicy foods.  Have someone cook for you if the smell of any food causes nausea and vomiting.  If you feel nauseous after taking prenatal vitamins, take the vitamins at night or with a snack.  Snack on protein foods between meals if you are hungry. Nuts, yogurt, and cheese are good options.  Drink fluids throughout the day.  Try ginger ale made with real ginger, ginger tea made from fresh grated ginger, or ginger candies. General instructions  Do not use any products that contain nicotine or tobacco, such as cigarettes and e-cigarettes. If you need help quitting, ask your health care provider.  Get an air purifier to keep the air in your house free of odors.  Get plenty of fresh air.  Try to avoid odors that trigger your nausea.  Consider trying these methods to help relieve symptoms: ? Wearing an acupressure wristband. These wristbands are often worn for seasickness. ? Acupuncture. Contact a health care provider if:  Your home remedies are  not working and you need medicine.  You feel dizzy or light-headed.  You are losing weight. Get help right away if:  You have persistent and uncontrolled nausea and vomiting.  You faint.  You have severe pain in your abdomen. Summary  Morning sickness is when a woman feels nauseous during pregnancy. This nauseous feeling may or may not come with vomiting.  Morning sickness is most common during the first trimester.  It often occurs in the morning, but it can be a problem at any time of day.  In many cases, treatment is not needed for this condition. Making some changes to what you eat may help to control symptoms. This information is not intended to replace advice given to you by your health care provider. Make sure you discuss any questions you have with your health care provider. Document Released: 07/10/2006 Document Revised: 05/01/2017 Document Reviewed: 06/21/2016 Elsevier Patient Education  2020 [employer]  Constipation, Adult Constipation is when a person:  Poops (has a bowel movement) fewer times in a week than normal.  Has a hard time pooping.  Has poop that is dry, hard, or bigger than normal. Follow these instructions at home: Eating and drinking   Eat foods that have a lot of fiber, such as: ? Fresh fruits and vegetables. ? Whole grains. ? Beans.  Eat less of foods that are high in fat, low in fiber, or overly processed, such as: ? [place] fries. ? Hamburgers. ? Cookies. ? Candy. ?  Soda.  Drink enough fluid to keep your pee (urine) clear or pale yellow. General instructions  Exercise regularly or as told by your doctor.  Go to the restroom when you feel like you need to poop. Do not hold it in.  Take over-the-counter and prescription medicines only as told by your doctor. These include any fiber supplements.  Do pelvic floor retraining exercises, such as: ? Doing deep breathing while relaxing your lower belly (abdomen). ? Relaxing your pelvic  floor while pooping.  Watch your condition for any changes.  Keep all follow-up visits as told by your doctor. This is important. Contact a doctor if:  You have pain that gets worse.  You have a fever.  You have not pooped for 4 days.  You throw up (vomit).  You are not hungry.  You lose weight.  You are bleeding from the anus.  You have thin, pencil-like poop (stool). Get help right away if:  You have a fever, and your symptoms suddenly get worse.  You leak poop or have blood in your poop.  Your belly feels hard or bigger than normal (is bloated).  You have very bad belly pain.  You feel dizzy or you faint. This information is not intended to replace advice given to you by your health care provider. Make sure you discuss any questions you have with your health care provider. Document Released: 11/05/2007 Document Revised: 05/01/2017 Document Reviewed: 11/07/2015 Elsevier Patient Education  2020 [employer].

## 2019-01-25 NOTE — Progress Notes (Signed)
ROB-Reports clear watery vaginal discharge for the last two (2) weeks and constant pelvic pressure for "awhile". Vaginal swab collected, see orders. Negative FFN on 01/15/2019, recollection not appropriate at this time. Seen on OB Triage over the weekend for constipation. Rx: Miralax, see orders. Growth [place] reviewed with patient, verbalized understanding; see below. Anticipatory guidance regarding course of prenatal care. Reviewed red flag symptoms and when to call. RTC x 2 weeks for ROB or sooner if needed.   ULTRASOUND REPORT  Location: [facility] Date of Service: 01/25/2019   Indications:growth/afi Findings:  Nelda [place] intrauterine pregnancy is visualized with FHR at 123 BPM. Biometrics give an (U/S) Gestational age of [redacted]w[redacted]d and an (U/S) EDD of 03/20/2019; this correlates with the clinically established Estimated Date of Delivery: 03/12/19.  Fetal presentation is Cephalic.  Placenta: posterior. Grade: 1 AFI: 13.4 cm  Growth percentile is 27. EFW: 1890 g ( 4 lbs 3 oz)   Impression: 1. [redacted]w[redacted]d Viable Singleton Intrauterine pregnancy previously established criteria. 2. Growth is 27 %ile.  AFI is 13.4 cm.   Recommendations: 1.Clinical correlation with the patient's History and Physical Exam.

## 2019-01-25 NOTE — Progress Notes (Signed)
ROB-Patient c/o clear watery vaginal d/c x2 weeks and constant pelvic pain and pressure "for a while".

## 2019-01-26 ENCOUNTER — Ambulatory Visit: Payer: Medicaid Other | Admitting: Physical Therapy

## 2019-01-26 DIAGNOSIS — M62838 Other muscle spasm: Secondary | ICD-10-CM | POA: Diagnosis not present

## 2019-01-26 DIAGNOSIS — R2689 Other abnormalities of gait and mobility: Secondary | ICD-10-CM

## 2019-01-26 DIAGNOSIS — M6281 Muscle weakness (generalized): Secondary | ICD-10-CM

## 2019-01-26 NOTE — Patient Instructions (Signed)
Wear belt for more support    Toileting position and proer breathign to lengthen pelvic floor for better elimination ( handout)      Lengthen Back rib by _ shoulder    Pull _ arm overhead over mattress, grab the edge of mattress,pull it upward, drawing elbow away from ears  Breathing

## 2019-01-26 NOTE — Therapy (Signed)
[facility] MAIN [facility] SERVICES [address] [place], [place], [address] Phone: [phone]   Fax:  [phone]  Physical Therapy Treatment  Patient Details  Name: Yolanda Garcia MRN: [id number] Date of Birth: 29-May-1996 Referring Provider (PT): Holly Bodily    Encounter Date: 01/26/2019  PT End of Session - 01/26/19 1241    Visit Number  2    Number of Visits  4    Date for PT Re-Evaluation  02/16/19    PT Start Time  0902    PT Stop Time  1003    PT Time Calculation (min)  61 min    Activity Tolerance  Patient tolerated treatment well       Past Medical History:  Diagnosis Date  . Panic attack     Past Surgical History:  Procedure Laterality Date  . NO PAST SURGERIES      There were no vitals filed for this visit.  Subjective Assessment - 01/26/19 0909    Subjective  Her LBP resolved after last session and she was able to sleep for a full night sleep  for the first time in a long time after last session.  The pain relief lasted for 2 days. Pt went to ER with abdominal cramping and pressure but the ER staff  told her that  these Sx were due to   constipation and not labor. Pt is trying to get her Miralax from the pharmacy today.  Pt is trying to drink more water and eating fruits and veggies.    Pertinent History  Pt had contractions on 8/14-15/20 with urge to push with gush of water and pain.Pt went to the ER and they gave shot in her arm to stop the contractions and was hydrated with IV and made sure her water did not break.   Denied falls on tailbone, no SUI.    Patient Stated Goals  Stop the pain at the low back and upper back         [facility] PT Assessment - 01/26/19 0911      Palpation   Spinal mobility  slight R trunk rotation in forward stance     SI assessment   levelled iliac crest, slightly increased tightness along L paraspinal, medial scap mm ( decreased post Tx)     Palpation comment  increased mm tightness R  paraspinal, interspinal, medial mm to scap, lateral instercostal mm R                 Pelvic Floor Special Questions - 01/26/19 0277    External Perineal Exam  dyscoordination on inhalation ( post Tx: improved coordination, optimal lengthening on inhalation for bowel movement)          [facility] Adult PT Treatment/Exercise - 01/26/19 0911      Ambulation/Gait   Gait velocity  1.04 m/s        Therapeutic Activites    Therapeutic Activities  --   toileting mechanics, SIJ belt donning/ doff     Neuro Re-ed    Neuro Re-ed Details   cued for proper pelvic floor lengthening       Modalities   Modalities  Moist Heat      Moist Heat Therapy   Number Minutes Moist Heat  5 Minutes    Moist Heat Location  --   thoracic     Manual Therapy   Manual therapy comments  STM/MWM  R paraspinal, interspinal, medial mm to scap, lateral instercostal mm  R  ( sidelying position)               PT Short Term Goals - 01/19/19 1737      PT SHORT TERM GOAL #1   Title  Pt will demo with decreased R paraspinal/ QL mm tensions in order to stand w/ less pain    Baseline  increased R paraspinal/ QL mm tensions in order to walk and stand w/ less pain    Time  2    Period  Weeks    Status  Achieved    Target Date  02/02/19      PT SHORT TERM GOAL #2   Title  Pt will demo increased hip strength abduction > 4+/5 B in order to walk with less pain    Baseline  3/5 B    Time  4    Period  Weeks    Status  New    Target Date  02/16/19      PT SHORT TERM GOAL #3   Title  Pt will increase gait speed to > 1.2 in order to cross road safely while pregnant    Baseline  0.8 m/s    Time  3    Period  Weeks    Status  New    Target Date  02/09/19      PT SHORT TERM GOAL #4   Title  Pt will demo no cues for equal weight bearing when standing to minmize pain    Baseline  prefers to stand on RLE, hip shifted    Time  2    Period  Weeks    Status  New    Target Date  02/02/19      PT SHORT  TERM GOAL #5   Title  Pt will demo proper breathing technique for pushing during labor to minimize straining pelvic floor / risks for pelvic floor dysfunctions    Time  2    Period  Weeks    Status  New    Target Date  02/02/19               Plan - 01/26/19 1242    Clinical Impression Statement Pt responded well to last session as pt reported resolved LBP. Today pt demo'd less R mm tightness and less R posterior trunk rotation post manual Tx which pt tolerated without increased pain. Pt was provided a SIJ belt and eduction on toileting posture. Her recent ER visit r/o labor and rule in her abdominal pressure and pain was 2/2 constipation. Pt was also educated on colon massage and corrected on proper coordination for proper lengthening pelvic floor mm to promote bowel elimination. Pt plans to pick up Miralax to address her constipation issue.  Plan to educate pt at next session on proper pushing technique during labor to minimize pelvic floor dysfunction and progress with strengthening.  Pt continues to benefit from skilled PT.    Examination-Activity Limitations  Sit;Stand;Locomotion Level;Sleep    Stability/Clinical Decision Making  Stable/Uncomplicated    Rehab Potential  Good    PT Frequency  1x / week    PT Duration  4 weeks    PT Treatment/Interventions  Gait training;Stair training;Neuromuscular re-education;Balance training;Therapeutic exercise;Therapeutic activities;Manual lymph drainage;Manual techniques;Patient/family education;Moist Heat;Taping;Spinal Manipulations;ADLs/Self Care Home Management;Functional mobility training    Consulted and Agree with Plan of Care  Patient       Patient will benefit from skilled therapeutic intervention in order to improve the following deficits and impairments:  Difficulty walking, Decreased range of motion, Decreased knowledge of precautions, Decreased strength, Decreased mobility, Decreased endurance, Decreased activity tolerance,  Increased muscle spasms, Improper body mechanics, Postural dysfunction, Pain  Visit Diagnosis: Other muscle spasm  Muscle weakness (generalized)  Other abnormalities of gait and mobility     Problem List Patient Active Problem List   Diagnosis Date Noted  . Nausea/vomiting in pregnancy 01/25/2019  . Constipation during pregnancy in third trimester 01/25/2019  . Pelvic pressure in pregnancy 01/25/2019  . Anemia affecting first pregnancy 01/05/2019  . Sickle cell trait ([place]) 08/23/2018    Jerl Mina ,PT, DPT, E-RYT  01/26/2019, 12:44 PM  [place] MAIN [facility] SERVICES [address] [place], [place], [address] Phone: [phone]   Fax:  [phone]  Name: Yolanda Garcia MRN: [id number] Date of Birth: December 17, 1995

## 2019-01-28 LAB — CERVICOVAGINAL ANCILLARY ONLY
Candida vaginitis: NEGATIVE
Chlamydia: NEGATIVE
Neisseria Gonorrhea: NEGATIVE
Trichomonas: NEGATIVE

## 2019-01-29 ENCOUNTER — Other Ambulatory Visit: Payer: Self-pay

## 2019-01-29 ENCOUNTER — Encounter: Payer: Self-pay | Admitting: Certified Nurse Midwife

## 2019-01-29 ENCOUNTER — Other Ambulatory Visit: Payer: Self-pay | Admitting: Certified Nurse Midwife

## 2019-01-29 ENCOUNTER — Observation Stay
Admission: EM | Admit: 2019-01-29 | Discharge: 2019-01-29 | Disposition: A | Discharge status: Home / Self Care | Payer: Medicaid Other | Attending: Certified Nurse Midwife | Admitting: Certified Nurse Midwife

## 2019-01-29 DIAGNOSIS — O99013 Anemia complicating pregnancy, third trimester: Secondary | ICD-10-CM | POA: Diagnosis not present

## 2019-01-29 DIAGNOSIS — R112 Nausea with vomiting, unspecified: Secondary | ICD-10-CM | POA: Insufficient documentation

## 2019-01-29 DIAGNOSIS — R102 Pelvic and perineal pain: Secondary | ICD-10-CM | POA: Diagnosis not present

## 2019-01-29 DIAGNOSIS — Z3A34 34 weeks gestation of pregnancy: Secondary | ICD-10-CM | POA: Insufficient documentation

## 2019-01-29 DIAGNOSIS — N760 Acute vaginitis: Secondary | ICD-10-CM

## 2019-01-29 DIAGNOSIS — O26893 Other specified pregnancy related conditions, third trimester: Secondary | ICD-10-CM | POA: Insufficient documentation

## 2019-01-29 DIAGNOSIS — R109 Unspecified abdominal pain: Secondary | ICD-10-CM

## 2019-01-29 DIAGNOSIS — D649 Anemia, unspecified: Secondary | ICD-10-CM | POA: Insufficient documentation

## 2019-01-29 DIAGNOSIS — O212 Late vomiting of pregnancy: Secondary | ICD-10-CM

## 2019-01-29 DIAGNOSIS — K5900 Constipation, unspecified: Secondary | ICD-10-CM | POA: Diagnosis not present

## 2019-01-29 DIAGNOSIS — B9689 Other specified bacterial agents as the cause of diseases classified elsewhere: Secondary | ICD-10-CM

## 2019-01-29 HISTORY — DX: Anemia, unspecified: D64.9

## 2019-01-29 LAB — URINALYSIS, COMPLETE (UACMP) WITH MICROSCOPIC
Bilirubin Urine: NEGATIVE
Glucose, UA: NEGATIVE mg/dL
Hgb urine dipstick: NEGATIVE
Ketones, ur: NEGATIVE mg/dL
Leukocytes,Ua: NEGATIVE
Nitrite: NEGATIVE
Protein, ur: NEGATIVE mg/dL
Specific Gravity, Urine: 1.004 — ABNORMAL LOW (ref 1.005–1.030)
pH: 6 (ref 5.0–8.0)

## 2019-01-29 MED ORDER — HYDROXYZINE HCL 25 MG PO TABS
25.0000 mg | ORAL_TABLET | Freq: Three times a day (TID) | ORAL | Status: DC | PRN
  Administered 2019-01-29: 21:00:00 25 mg via ORAL
  Filled 2019-01-29 (×2): qty 1

## 2019-01-29 MED ORDER — METRONIDAZOLE 500 MG PO TABS: 500.0000 mg | ORAL_TABLET | Freq: Two times a day (BID) | ORAL | 0 refills | Status: AC

## 2019-01-29 MED ORDER — METRONIDAZOLE 500 MG PO TABS
500.0000 mg | ORAL_TABLET | Freq: Two times a day (BID) | ORAL | Status: DC
  Filled 2019-01-29: qty 1

## 2019-01-29 MED ORDER — POLYETHYLENE GLYCOL 3350 17 G PO PACK: 17.0000 g | PACK | Freq: Every day | ORAL | 0 refills | Status: DC

## 2019-01-29 MED ORDER — POLYETHYLENE GLYCOL 3350 17 G PO PACK
17.0000 g | PACK | Freq: Every day | ORAL | Status: DC
  Administered 2019-01-29: 21:00:00 17 g via ORAL
  Filled 2019-01-29: qty 1

## 2019-01-29 MED ORDER — HYDROXYZINE HCL 25 MG PO TABS: 25.0000 mg | ORAL_TABLET | Freq: Three times a day (TID) | ORAL | 0 refills | Status: DC | PRN

## 2019-01-29 MED ORDER — METRONIDAZOLE 500 MG PO TABS
500.0000 mg | ORAL_TABLET | Freq: Two times a day (BID) | ORAL | Status: DC
  Administered 2019-01-29: 21:00:00 500 mg via ORAL
  Filled 2019-01-29: qty 1

## 2019-01-29 NOTE — OB Triage Note (Signed)
Pt arrival to triage with c/o contractions every minute since 1945.  Pt states she feels lots of lower abdominal pressure.  Pt denies vaginal bleeding and LOF and is feeling baby move normally.  EFM and toco applied and assessing.

## 2019-01-29 NOTE — Progress Notes (Signed)
Rx: Flagyl, see orders.    Diona Fanti, CNM [facility], [facility] 01/29/19 9:31 AM

## 2019-01-29 NOTE — OB Triage Note (Signed)
Discharge instructions provided and reviewed.  Follow up care discussed.  Pt verbalizes understanding. 

## 2019-01-30 NOTE — Discharge Summary (Signed)
Antenatal Discharge Summary   Patient ON:GEXBM:Yolanda Garcia   MRN: [id number]   DOB/AGE: 09-07-1995 23 y.o.  Admit date: 01/29/2019   Discharge date: 01/30/2019  Admission Diagnoses: Observation at 34 weeks, Abdominal pain in pregnancy, Nausea/vomiting in pregnancy, Anemia in pregnancy, Constipation in pregnancy, Pelvic pressure in pregnancy  Discharge Diagnoses: Same  Prenatal Procedures: NST  Consults: None  Significant Diagnostic Studies:  Results for orders placed or performed during the hospital encounter of 01/29/19 (from the past 168 hour(s))  Urinalysis, Complete w Microscopic   Collection Time: 01/29/19  8:20 PM  Result Value Ref Range   Color, Urine YELLOW (A) YELLOW   APPearance HAZY (A) CLEAR   Specific Gravity, Urine 1.004 (L) 1.005 - 1.030   pH 6.0 5.0 - 8.0   Glucose, UA NEGATIVE NEGATIVE mg/dL   Hgb urine dipstick NEGATIVE NEGATIVE   Bilirubin Urine NEGATIVE NEGATIVE   Ketones, ur NEGATIVE NEGATIVE mg/dL   Protein, ur NEGATIVE NEGATIVE mg/dL   Nitrite NEGATIVE NEGATIVE   Leukocytes,Ua NEGATIVE NEGATIVE   RBC / HPF 0-5 0 - 5 RBC/hpf   WBC, UA 0-5 0 - 5 WBC/hpf   Bacteria, UA RARE (A) NONE SEEN   Squamous Epithelial / LPF 0-5 0 - 5  Results for orders placed or performed in visit on 01/25/19 (from the past 168 hour(s))  POC Urinalysis Dipstick OB   Collection Time: 01/25/19  2:49 PM  Result Value Ref Range   Color, UA Yellow    Clarity, UA Clear    Glucose, UA Negative Negative   Bilirubin, UA Negative    Ketones, UA Negative    Spec Grav, UA 1.020 1.010 - 1.025   Blood, UA Negative    pH, UA 6.0 5.0 - 8.0   POC,PROTEIN,UA Negative Negative, Trace, Small (1+), Moderate (2+), Large (3+), 4+   Urobilinogen, UA 0.2 0.2 or 1.0 E.U./dL   Nitrite, UA Negative    Leukocytes, UA Small (1+) (A) Negative   Appearance     Odor      Treatments: Flagyl PO, Vistaril PO, MiraLax PO, and oral hydration with water  Hospital Course:   This is a 23 y.o. G1P0 with  IUP at 6510w0d placed in observation due to report of contractions every minute since 1945 and lower abdominal pressure. Patient was at church gathering that included a "group dancing" when she first noticed symptoms. Oral liquid intake today included "several" glasses of juice and "one normal sized bottle of water".   Denies leaking of fluid and vaginal bleeding. She was observed, fetal heart rate monitoring remained reassuring, and she had no signs/symptoms of progressing preterm labor or other maternal-fetal concerns. She was deemed stable for discharge to home with outpatient follow up.  Review of Systems:  ROS negative except as noted above. Information obtained from patient.   Discharge Exam:  Temp:  [98.1 F (36.7 C)] 98.1 F (36.7 C) (08/29 2024) Pulse Rate:  [87] 87 (08/29 2024) Resp:  [16] 16 (08/29 2024) BP: (101)/(53) 101/53 (08/29 2024) Weight:  [50.3 kg] 50.3 kg (08/29 2029)  General appearance: alert and cooperative Resp: clear to auscultation bilaterally Breasts: normal appearance, no masses or tenderness Cardio: normal apical impulse Pelvic: cervix normal in appearance, external genitalia normal, no cervical motion tenderness and visually closed on speculum exam Pulses: 2+ and symmetric Skin: Skin color, texture, turgor normal. No rashes or lesions  Discharge Condition: Stable  Disposition: Home or Self Care  Discharge Instructions    Discharge activity:  No Restrictions  Complete by: As directed    Discharge diet:  No restrictions   Complete by: As directed    Discharge instructions   Complete by: As directed    See AVS   Fetal Kick Count:  Lie on our left side for one hour after a meal, and count the number of times your baby kicks.  If it is less than 5 times, get up, move around and drink some juice.  Repeat the test 30 minutes later.  If it is still less than 5 kicks in an hour, notify your doctor.   Complete by: As directed    No sexual activity  restrictions   Complete by: As directed    Notify physician for a general feeling that "something is not right"   Complete by: As directed    Notify physician for increase or change in vaginal discharge   Complete by: As directed    Notify physician for intestinal cramps, with or without diarrhea, sometimes described as "gas pain"   Complete by: As directed    Notify physician for leaking of fluid   Complete by: As directed    Notify physician for low, dull backache, unrelieved by heat or Tylenol   Complete by: As directed    Notify physician for menstrual like cramps   Complete by: As directed    Notify physician for pelvic pressure   Complete by: As directed    Notify physician for uterine contractions.  These may be painless and feel like the uterus is tightening or the baby is  "balling up"   Complete by: As directed    Notify physician for vaginal bleeding   Complete by: As directed    PRETERM LABOR:  Includes any of the follwing symptoms that occur between 20 - [redacted] weeks gestation.  If these symptoms are not stopped, preterm labor can result in preterm delivery, placing your baby at risk   Complete by: As directed      Allergies as of 01/29/2019   No Known Allergies     Medication List    TAKE these medications   CitraNatal Bloom 90-1 MG Tabs Take 1 tablet by mouth daily.   Diclegis 10-10 MG Tbec Generic drug: Doxylamine-Pyridoxine Take 1 tablet by mouth 2 (two) times daily.   hydrOXYzine 25 MG tablet Commonly known as: ATARAX/VISTARIL Take 1 tablet (25 mg total) by mouth 3 (three) times daily as needed (contractions).   metroNIDAZOLE 500 MG tablet Commonly known as: FLAGYL Take 1 tablet (500 mg total) by mouth 2 (two) times daily for 7 days.   polyethylene glycol 17 g packet Commonly known as: MIRALAX / GLYCOLAX Take 17 g by mouth daily.        Diona Fanti, CNM [facility], [facility] 01/30/19 10:39 AM

## 2019-01-31 ENCOUNTER — Other Ambulatory Visit: Payer: Self-pay

## 2019-01-31 MED ORDER — DICLEGIS 10-10 MG PO TBEC: 1.0000 | DELAYED_RELEASE_TABLET | Freq: Two times a day (BID) | ORAL | 2 refills | Status: DC

## 2019-02-02 ENCOUNTER — Other Ambulatory Visit: Payer: Self-pay

## 2019-02-02 ENCOUNTER — Ambulatory Visit: Payer: Medicaid Other | Attending: Certified Nurse Midwife | Admitting: Physical Therapy

## 2019-02-02 DIAGNOSIS — R2689 Other abnormalities of gait and mobility: Secondary | ICD-10-CM | POA: Insufficient documentation

## 2019-02-02 DIAGNOSIS — M6281 Muscle weakness (generalized): Secondary | ICD-10-CM | POA: Diagnosis present

## 2019-02-02 DIAGNOSIS — M62838 Other muscle spasm: Secondary | ICD-10-CM | POA: Diagnosis present

## 2019-02-02 NOTE — Patient Instructions (Addendum)
  Proper body mechanics with getting out of a chair to decrease strain  on back &pelvic floor   Avoid holding your breath when Getting out of the chair:  Scoot to front part of chair chair Heels behind feet, feet are hip width apart, nose over toes  Inhale like you are smelling roses Exhale to stand , push off with hands on the chair    ____  Strengthening your deep core muscles  (handout- deep core 1 and 2)  Twice a day in reclined position not on your back   ___   Strengthening for scoliosis :  "Drawing a sword" " pulling a lawnmover"   Band under R feet, L hand holds end of the band that wraps the outside of R thigh Keep thigh aligned with toes and not let them move inward,  Keep elbow by your ribs Pull from R thigh to L shoulder, twisting trunk to L, elbow stays by ribs   10 x 2 reps

## 2019-02-02 NOTE — Therapy (Signed)
[place] MAIN [facility] SERVICES [address] [place], [place], [address] Phone: [phone]   Fax:  [phone]  Physical Therapy Treatment / Progress Note   Patient Details  Name: Yolanda Garcia MRN: [id number] Date of Birth: [birth year] Referring Provider (PT): Lucretia Kern    Encounter Date: 02/02/2019  PT End of Session - 02/02/19 1146    Visit Number  3    Number of Visits  9    Date for PT Re-Evaluation  04/06/19    PT Start Time  1110    PT Stop Time  1200    PT Time Calculation (min)  50 min    Activity Tolerance  Patient tolerated treatment well       Past Medical History:  Diagnosis Date  . Anemia   . Panic attack     Past Surgical History:  Procedure Laterality Date  . NO PAST SURGERIES      There were no vitals filed for this visit.  Subjective Assessment - 02/02/19 1115    Subjective  Pt reported she started Miralax and has had 3 bowel mvoemetns since taking it. The bowels are small in size and she does not have to strain. Pt felt good after leaving last sessions which lasted for 3 days and was able to sleep well. Pt 's LBP returned but it is less level of pain than before from a 8-9/10 to 3/10. Pt is having a hard time getting up from  a chair.    Pertinent History  Pt had contractions on 8/14-15/20 with urge to push with gush of water and pain.Pt went to the ER and they gave shot in her arm to stop the contractions and was hydrated with IV and made sure her water did not break.   Denied falls on tailbone, no SUI.    Patient Stated Goals  Stop the pain at the low back and upper back         [facility] PT Assessment - 02/02/19 1138      Palpation   Spinal mobility  slight R trunk rotation     SI assessment   levelled pelvis, no increased mm tightness deeming pt ready for strengthening                     [facility] Adult PT Treatment/Exercise - 02/02/19 1141      Neuro Re-ed    Neuro Re-ed Details   cued  for proper technique with band and deep core , see pt instructions                PT Short Term Goals - 02/02/19 1147      PT SHORT TERM GOAL #1   Title  Pt will demo with decreased R paraspinal/ QL mm tensions in order to stand w/ less pain    Baseline  increased R paraspinal/ QL mm tensions in order to walk and stand w/ less pain    Time  2    Period  Weeks    Status  Achieved    Target Date  02/02/19      PT SHORT TERM GOAL #2   Title  Pt will demo increased hip strength abduction > 4+/5 B in order to walk with less pain    Baseline  3/5 B    Time  4    Period  Weeks    Status  On-going    Target Date  02/16/19  PT SHORT TERM GOAL #3   Title  Pt will increase gait speed to > 1.2 in order to cross road safely while pregnant    Baseline  0.8 m/s    Time  3    Period  Weeks    Status  Achieved    Target Date  02/09/19      PT SHORT TERM GOAL #4   Title  Pt will demo no cues for equal weight bearing when standing to minmize pain    Baseline  prefers to stand on RLE, hip shifted    Time  2    Period  Weeks    Status  Partially Met    Target Date  02/02/19      PT SHORT TERM GOAL #5   Title  Pt will demo proper breathing technique for pushing during labor to minimize straining pelvic floor / risks for pelvic floor dysfunctions    Time  2    Period  Weeks    Status  Achieved    Target Date  02/02/19        PT Long Term Goals - 02/02/19 1147      PT LONG TERM GOAL #1   Title  Pt will demo decreased R trunk posterior rotation 2/2 scoliosis and no increased mm tightness along back mm across 2 weeks in order to minimize back pain and be prepared to lift her baby as he grows heavier    Baseline  R trunk posterior rotation and back mm tightness    Time  4    Period  Weeks    Status  New    Target Date  03/02/19      PT LONG TERM GOAL #2   Title  Pt will be IND with back and UE strengthening HEP and IND with bodymechanics with simulated tasks in carrying  and lifting baby to minimize relapse of back pain post partum    Baseline  no IND yet    Time  8    Period  Weeks    Status  New    Target Date  03/30/19      PT LONG TERM GOAL #3   Title  Pt will report regular bowel movements without straining daily to every other day ( with or without Miralax) in order to minimize pelvic floor dysfunctions    Baseline  every 2 days of bowel movements    Time  4    Period  Weeks    Status  New    Target Date  03/02/19            Plan - 02/02/19 1146    Clinical Impression Statement  Pt has achieved 3/4 STG and progressing well towards LTGs. Pt showed no back mm tightness and has maintained equal alignment of pelvic girdle. Pt reports her back pain has decreased from 8-9/10 to 3/10 and has stayed at this level across the past 2 weeks. Pt's scoliosis is getting addressed with stretches and strengthening HEP to minimize worsening of thoracolumbar curve. Also provided pt deep core and pelvic floor strengthening . Pt demo'd proper technique with sit to stand without back pain.  Today after Tx, pt reported her back pain decreased from 3/10 to 1/10.  Pt remains motivated and compliant. Pt continues to benefit from skilled PT.     Examination-Activity Limitations  Sit;Stand;Locomotion Level;Sleep    Stability/Clinical Decision Making  Stable/Uncomplicated    Rehab Potential  Good    PT  Frequency  1x / week    PT Duration  4 weeks    PT Treatment/Interventions  Gait training;Stair training;Neuromuscular re-education;Balance training;Therapeutic exercise;Therapeutic activities;Manual lymph drainage;Manual techniques;Patient/family education;Moist Heat;Taping;Spinal Manipulations;ADLs/Self Care Home Management;Functional mobility training    Consulted and Agree with Plan of Care  Patient       Patient will benefit from skilled therapeutic intervention in order to improve the following deficits and impairments:  Difficulty walking, Decreased range of motion,  Decreased knowledge of precautions, Decreased strength, Decreased mobility, Decreased endurance, Decreased activity tolerance, Increased muscle spasms, Improper body mechanics, Postural dysfunction, Pain  Visit Diagnosis: Other muscle spasm  Muscle weakness (generalized)  Other abnormalities of gait and mobility     Problem List Patient Active Problem List   Diagnosis Date Noted  . Indication for care in labor and delivery, antepartum 01/29/2019  . Nausea/vomiting in pregnancy 01/25/2019  . Constipation during pregnancy in third trimester 01/25/2019  . Pelvic pressure in pregnancy 01/25/2019  . Anemia affecting first pregnancy 01/05/2019  . Sickle cell trait ([place]) 08/23/2018    Jerl Mina ,PT, DPT, E-RYT  02/02/2019, 11:57 AM  [place] MAIN [facility] SERVICES [address] [place], [place], [address] Phone: [phone]   Fax:  [phone]  Name: Adria Costley MRN: [id number] Date of Birth: 10-28-1995

## 2019-02-09 ENCOUNTER — Encounter: Payer: Self-pay | Admitting: Certified Nurse Midwife

## 2019-02-09 ENCOUNTER — Ambulatory Visit (INDEPENDENT_AMBULATORY_CARE_PROVIDER_SITE_OTHER): Payer: Medicaid Other | Admitting: Certified Nurse Midwife

## 2019-02-09 ENCOUNTER — Other Ambulatory Visit: Payer: Self-pay

## 2019-02-09 ENCOUNTER — Ambulatory Visit: Payer: Medicaid Other | Admitting: Physical Therapy

## 2019-02-09 VITALS — BP 92/64 | HR 85 | Wt 115.2 lb

## 2019-02-09 DIAGNOSIS — Z3493 Encounter for supervision of normal pregnancy, unspecified, third trimester: Secondary | ICD-10-CM

## 2019-02-09 LAB — POCT URINALYSIS DIPSTICK OB
Bilirubin, UA: NEGATIVE
Blood, UA: NEGATIVE
Glucose, UA: NEGATIVE
Ketones, UA: NEGATIVE
Leukocytes, UA: NEGATIVE
Nitrite, UA: NEGATIVE
POC,PROTEIN,UA: NEGATIVE
Spec Grav, UA: 1.01 (ref 1.010–1.025)
Urobilinogen, UA: 0.2 E.U./dL
pH, UA: 5 (ref 5.0–8.0)

## 2019-02-09 NOTE — Progress Notes (Signed)
ROB doing well. Feels good movement. Has episodes of contractions, reviewed PTL precautions. Pt states that she is unable to medication Sharyn Lull gave her because it makes her vomit. Discussed PO hydration and resting , if she take medication and vomits and has concern about contractions to return to hospital for evaluations. She verbalizes and agrees to plan. Follow up 1 wk , anticipatory guidance for GBS testing and SVE.   Philip Aspen, CNM

## 2019-02-09 NOTE — Patient Instructions (Signed)

## 2019-02-13 ENCOUNTER — Encounter: Payer: Self-pay | Admitting: Certified Nurse Midwife

## 2019-02-13 ENCOUNTER — Inpatient Hospital Stay
Admission: EM | Admit: 2019-02-13 | Discharge: 2019-02-16 | DRG: 807 | Disposition: A | Discharge status: Home / Self Care | Payer: Medicaid Other | Attending: Certified Nurse Midwife | Admitting: Certified Nurse Midwife

## 2019-02-13 DIAGNOSIS — Z20828 Contact with and (suspected) exposure to other viral communicable diseases: Secondary | ICD-10-CM | POA: Diagnosis present

## 2019-02-13 DIAGNOSIS — O99824 Streptococcus B carrier state complicating childbirth: Secondary | ICD-10-CM | POA: Diagnosis present

## 2019-02-13 DIAGNOSIS — O9902 Anemia complicating childbirth: Secondary | ICD-10-CM | POA: Diagnosis present

## 2019-02-13 DIAGNOSIS — D649 Anemia, unspecified: Secondary | ICD-10-CM | POA: Diagnosis present

## 2019-02-13 DIAGNOSIS — Z3A36 36 weeks gestation of pregnancy: Secondary | ICD-10-CM

## 2019-02-13 LAB — URINALYSIS, ROUTINE W REFLEX MICROSCOPIC
Bilirubin Urine: NEGATIVE
Glucose, UA: NEGATIVE mg/dL
Ketones, ur: NEGATIVE mg/dL
Nitrite: NEGATIVE
Protein, ur: NEGATIVE mg/dL
Specific Gravity, Urine: 1.006 (ref 1.005–1.030)
WBC, UA: >50 WBC/hpf — ABNORMAL HIGH (ref 0–5)
pH: 7 (ref 5.0–8.0)

## 2019-02-13 LAB — URINE DRUG SCREEN, QUALITATIVE (ARMC ONLY)
Amphetamines, Ur Screen: NOT DETECTED
Barbiturates, Ur Screen: NOT DETECTED
Benzodiazepine, Ur Scrn: NOT DETECTED
Cannabinoid 50 Ng, Ur ~~LOC~~: NOT DETECTED
Cocaine Metabolite,Ur ~~LOC~~: NOT DETECTED
MDMA (Ecstasy)Ur Screen: NOT DETECTED
Methadone Scn, Ur: NOT DETECTED
Opiate, Ur Screen: NOT DETECTED
Phencyclidine (PCP) Ur S: NOT DETECTED
Tricyclic, Ur Screen: NOT DETECTED

## 2019-02-13 MED ORDER — ACETAMINOPHEN 325 MG PO TABS
650.0000 mg | ORAL_TABLET | Freq: Once | ORAL | Status: AC
  Administered 2019-02-13: 650 mg via ORAL
  Filled 2019-02-13: qty 2

## 2019-02-13 MED ORDER — AMOXICILLIN 500 MG PO CAPS
500.0000 mg | ORAL_CAPSULE | Freq: Three times a day (TID) | ORAL | Status: DC
  Administered 2019-02-13: 500 mg via ORAL
  Filled 2019-02-13 (×2): qty 1

## 2019-02-13 NOTE — OB Triage Note (Signed)
Pt arrived to unit with c/o ctx "all day"  And questional LOF at approximates 2045 today. +FM. Pt states that she had some vaginal spotting this AM, but none since. Denies intercourse in the last 24 hrs. Continue to assess.

## 2019-02-14 ENCOUNTER — Other Ambulatory Visit: Payer: Self-pay

## 2019-02-14 ENCOUNTER — Encounter: Payer: Self-pay | Admitting: *Deleted

## 2019-02-14 DIAGNOSIS — O99824 Streptococcus B carrier state complicating childbirth: Secondary | ICD-10-CM | POA: Diagnosis present

## 2019-02-14 DIAGNOSIS — Z3A36 36 weeks gestation of pregnancy: Secondary | ICD-10-CM | POA: Diagnosis not present

## 2019-02-14 DIAGNOSIS — Z20828 Contact with and (suspected) exposure to other viral communicable diseases: Secondary | ICD-10-CM | POA: Diagnosis present

## 2019-02-14 DIAGNOSIS — O9902 Anemia complicating childbirth: Secondary | ICD-10-CM | POA: Diagnosis present

## 2019-02-14 DIAGNOSIS — D649 Anemia, unspecified: Secondary | ICD-10-CM | POA: Diagnosis present

## 2019-02-14 LAB — SARS CORONAVIRUS 2 BY RT PCR (HOSPITAL ORDER, PERFORMED IN ~~LOC~~ HOSPITAL LAB): SARS Coronavirus 2: NEGATIVE

## 2019-02-14 LAB — TYPE AND SCREEN
ABO/RH(D): A POS
Antibody Screen: NEGATIVE

## 2019-02-14 LAB — CBC WITH DIFFERENTIAL/PLATELET
Abs Immature Granulocytes: 0.1 10*3/uL — ABNORMAL HIGH (ref 0.00–0.07)
Basophils Absolute: 0 10*3/uL (ref 0.0–0.1)
Basophils Relative: 0 %
Eosinophils Absolute: 0.1 10*3/uL (ref 0.0–0.5)
Eosinophils Relative: 1 %
HCT: 34 % — ABNORMAL LOW (ref 36.0–46.0)
Hemoglobin: 10.9 g/dL — ABNORMAL LOW (ref 12.0–15.0)
Immature Granulocytes: 1 %
Lymphocytes Relative: 14 %
Lymphs Abs: 1.5 10*3/uL (ref 0.7–4.0)
MCH: 27 pg (ref 26.0–34.0)
MCHC: 32.1 g/dL (ref 30.0–36.0)
MCV: 84.2 fL (ref 80.0–100.0)
Monocytes Absolute: 0.9 10*3/uL (ref 0.1–1.0)
Monocytes Relative: 8 %
Neutro Abs: 8.4 10*3/uL — ABNORMAL HIGH (ref 1.7–7.7)
Neutrophils Relative %: 76 %
Platelets: 206 10*3/uL (ref 150–400)
RBC: 4.04 MIL/uL (ref 3.87–5.11)
RDW: 12.6 % (ref 11.5–15.5)
WBC: 10.9 10*3/uL — ABNORMAL HIGH (ref 4.0–10.5)
nRBC: 0.2 % (ref 0.0–0.2)

## 2019-02-14 LAB — GROUP B STREP BY PCR: Group B strep by PCR: POSITIVE — AB

## 2019-02-14 LAB — RPR: RPR Ser Ql: NONREACTIVE

## 2019-02-14 LAB — RUPTURE OF MEMBRANE (ROM)PLUS: Rom Plus: NEGATIVE

## 2019-02-14 MED ORDER — ONDANSETRON HCL 4 MG/2ML IJ SOLN: 4.0000 mg | Freq: Four times a day (QID) | INTRAMUSCULAR | Status: DC | PRN

## 2019-02-14 MED ORDER — OXYTOCIN 10 UNIT/ML IJ SOLN
INTRAMUSCULAR | Status: AC
  Filled 2019-02-14: qty 2

## 2019-02-14 MED ORDER — SIMETHICONE 80 MG PO CHEW: 80.0000 mg | CHEWABLE_TABLET | ORAL | Status: DC | PRN

## 2019-02-14 MED ORDER — ONDANSETRON HCL 4 MG/2ML IJ SOLN: 4.0000 mg | INTRAMUSCULAR | Status: DC | PRN

## 2019-02-14 MED ORDER — POLYETHYLENE GLYCOL 3350 17 G PO PACK
17.0000 g | PACK | Freq: Every day | ORAL | Status: DC
  Administered 2019-02-14: 12:00:00 17 g via ORAL
  Filled 2019-02-14 (×3): qty 1

## 2019-02-14 MED ORDER — FENTANYL CITRATE (PF) 100 MCG/2ML IJ SOLN: 50.0000 ug | INTRAMUSCULAR | Status: DC | PRN

## 2019-02-14 MED ORDER — BUTORPHANOL TARTRATE 1 MG/ML IJ SOLN
2.0000 mg | Freq: Once | INTRAMUSCULAR | Status: AC
  Administered 2019-02-14: 02:00:00 1 mg via INTRAVENOUS

## 2019-02-14 MED ORDER — SODIUM CHLORIDE 0.9 % IV SOLN
2.0000 g | Freq: Once | INTRAVENOUS | Status: DC
  Filled 2019-02-14 (×2): qty 2000

## 2019-02-14 MED ORDER — LIDOCAINE HCL (PF) 1 % IJ SOLN
INTRAMUSCULAR | Status: AC
  Filled 2019-02-14: qty 30

## 2019-02-14 MED ORDER — LACTATED RINGERS IV SOLN
INTRAVENOUS | Status: DC
  Administered 2019-02-14: 02:00:00 via INTRAVENOUS

## 2019-02-14 MED ORDER — SOD CITRATE-CITRIC ACID 500-334 MG/5ML PO SOLN: 30.0000 mL | ORAL | Status: DC | PRN

## 2019-02-14 MED ORDER — DIBUCAINE (PERIANAL) 1 % EX OINT: 1.0000 "application " | TOPICAL_OINTMENT | CUTANEOUS | Status: DC | PRN

## 2019-02-14 MED ORDER — OXYTOCIN 40 UNITS IN NORMAL SALINE INFUSION - SIMPLE MED
2.5000 [IU]/h | INTRAVENOUS | Status: DC
  Filled 2019-02-14: qty 1000

## 2019-02-14 MED ORDER — IBUPROFEN 600 MG PO TABS
600.0000 mg | ORAL_TABLET | Freq: Four times a day (QID) | ORAL | Status: DC
  Administered 2019-02-15 – 2019-02-16 (×3): 600 mg via ORAL
  Filled 2019-02-14 (×8): qty 1

## 2019-02-14 MED ORDER — OXYTOCIN BOLUS FROM INFUSION
500.0000 mL | Freq: Once | INTRAVENOUS | Status: AC
  Administered 2019-02-14: 500 mL via INTRAVENOUS

## 2019-02-14 MED ORDER — AMMONIA AROMATIC IN INHA
RESPIRATORY_TRACT | Status: AC
  Filled 2019-02-14: qty 10

## 2019-02-14 MED ORDER — ACETAMINOPHEN 325 MG PO TABS: 650.0000 mg | ORAL_TABLET | ORAL | Status: DC | PRN

## 2019-02-14 MED ORDER — BUTORPHANOL TARTRATE 1 MG/ML IJ SOLN
INTRAMUSCULAR | Status: AC
  Administered 2019-02-14: 02:00:00 1 mg via INTRAVENOUS
  Filled 2019-02-14: qty 2

## 2019-02-14 MED ORDER — COCONUT OIL OIL: 1.0000 "application " | TOPICAL_OIL | Status: DC | PRN

## 2019-02-14 MED ORDER — LACTATED RINGERS IV SOLN: 500.0000 mL | INTRAVENOUS | Status: DC | PRN

## 2019-02-14 MED ORDER — VARICELLA VIRUS VACCINE LIVE 1350 PFU/0.5ML IJ SUSR: 0.5000 mL | INTRAMUSCULAR | Status: DC | PRN

## 2019-02-14 MED ORDER — BENZOCAINE-MENTHOL 20-0.5 % EX AERO: 1.0000 "application " | INHALATION_SPRAY | CUTANEOUS | Status: DC | PRN

## 2019-02-14 MED ORDER — INFLUENZA VAC SPLIT QUAD 0.5 ML IM SUSY
0.5000 mL | PREFILLED_SYRINGE | INTRAMUSCULAR | Status: DC
  Filled 2019-02-14: qty 0.5

## 2019-02-14 MED ORDER — ONDANSETRON HCL 4 MG PO TABS: 4.0000 mg | ORAL_TABLET | ORAL | Status: DC | PRN

## 2019-02-14 MED ORDER — LIDOCAINE HCL (PF) 1 % IJ SOLN: 30.0000 mL | INTRAMUSCULAR | Status: DC | PRN

## 2019-02-14 MED ORDER — WITCH HAZEL-GLYCERIN EX PADS: 1.0000 "application " | MEDICATED_PAD | CUTANEOUS | Status: DC | PRN

## 2019-02-14 MED ORDER — MISOPROSTOL 200 MCG PO TABS
ORAL_TABLET | ORAL | Status: AC
  Filled 2019-02-14: qty 4

## 2019-02-14 MED ORDER — SODIUM CHLORIDE 0.9 % IV SOLN: 1.0000 g | INTRAVENOUS | Status: DC

## 2019-02-14 MED ORDER — SODIUM CHLORIDE 0.9 % IV SOLN
2.0000 g | INTRAVENOUS | Status: DC
  Administered 2019-02-14: 02:00:00 2 g via INTRAVENOUS
  Filled 2019-02-14 (×3): qty 2000

## 2019-02-14 NOTE — H&P (Signed)
Obstetric History and Physical  Yolanda Garcia is a 23 y.o. G1P0 with IUP at 2871w2d presenting with irregular contractions all day that have gotten worse during the evening, also concerned about increased leaking and discharge, denies any gush of fluids except for a small amount when she got out of the tub.. Patient states she has been having  irregular, every 1-3 minutes contractions, none vaginal bleeding, intact membranes, with active fetal movement.    Prenatal Course [facility]  Pregnancy complications or risks:preterm contractions, anemia,   Prenatal labs and studies: ABO, Rh: A/Positive/-- (03/16 1536) Antibody: Negative (03/16 1536) Rubella: 2.30 (03/16 1536) RPR: Non Reactive (08/04 1034)  HBsAg: Negative (03/16 1536)  HIV: Non Reactive (03/16 1536)  GBS: not done 1 hr Glucola  normal Genetic screening normal Anatomy US normal  Past Medical History:  Diagnosis Date  . Anemia   . Panic attack     Past Surgical History:  Procedure Laterality Date  . NO PAST SURGERIES      OB History  Gravida Para Term Preterm AB Living  1            SAB TAB Ectopic Multiple Live Births               # Outcome Date GA Lbr Len/2nd Weight Sex Delivery Anes PTL Lv  1 Current             Social History   Socioeconomic History  . Marital status: Single    Spouse name: Not on file  . Number of children: Not on file  . Years of education: Not on file  . Highest education level: Not on file  Occupational History  . Not on file  Social Needs  . Financial resource strain: Not on file  . Food insecurity    Worry: Not on file    Inability: Not on file  . Transportation needs    Medical: Not on file    Non-medical: Not on file  Tobacco Use  . Smoking status: Never Smoker  . Smokeless tobacco: Never Used  Substance and Sexual Activity  . Alcohol use: No  . Drug use: Never  . Sexual activity: Yes  Lifestyle  . Physical activity    Days per week: Not on file     Minutes per session: Not on file  . Stress: Not on file  Relationships  . Social Musicianconnections    Talks on phone: Not on file    Gets together: Not on file    Attends religious service: Not on file    Active member of club or organization: Not on file    Attends meetings of clubs or organizations: Not on file    Relationship status: Not on file  Other Topics Concern  . Not on file  Social History Narrative  . Not on file    Family History  Problem Relation Age of Onset  . Healthy Mother     Medications Prior to Admission  Medication Sig Dispense Refill Last Dose  . DICLEGIS 10-10 MG TBEC Take 1 tablet by mouth 2 (two) times daily. 60 tablet 2 02/13/2019 at Unknown time  . metroNIDAZOLE (FLAGYL) 500 MG tablet Take 500 mg by mouth 2 (two) times daily.     . hydrOXYzine (ATARAX/VISTARIL) 25 MG tablet Take 1 tablet (25 mg total) by mouth 3 (three) times daily as needed (contractions). (Patient not taking: Reported on 02/13/2019) 30 tablet 0 Not Taking at Unknown time  .  polyethylene glycol (MIRALAX / GLYCOLAX) 17 g packet Take 17 g by mouth daily. (Patient not taking: Reported on 02/13/2019) 14 each 0 Completed Course at Unknown time  . Prenatal-DSS-FeCb-FeGl-FA (CITRANATAL BLOOM) 90-1 MG TABS Take 1 tablet by mouth daily. 30 tablet 11     No Known Allergies  Review of Systems: Negative except for what is mentioned in HPI.  Physical Exam: LMP 06/02/2018 (Exact Date)  GENERAL: Well-developed, well-nourished female in no acute distress.  LUNGS: Clear to auscultation bilaterally.  HEART: Regular rate and rhythm. ABDOMEN: Soft, nontender, nondistended, gravid. EXTREMITIES: Nontender, no edema, 2+ distal pulses. Cervical Exam: Dilation: 4.5 Effacement (%): 100 Cervical Position: [place]: 0, Plus 1 Exam by:: M. Edge, rN FHT:  Baseline rate 138 bpm   Variability moderate  Accelerations present   Decelerations none Contractions: Every 1-4 mins   Pertinent Labs/Studies:    Results for orders placed or performed during the hospital encounter of 02/13/19 (from the past 24 hour(s))  ROM Plus ([facility] only)     Status: None   Collection Time: 02/13/19 10:09 PM  Result Value Ref Range   Rom Plus NEGATIVE   Urinalysis, Routine w reflex microscopic     Status: Abnormal   Collection Time: 02/13/19 10:24 PM  Result Value Ref Range   Color, Urine YELLOW (A) YELLOW   APPearance CLOUDY (A) CLEAR   Specific Gravity, Urine 1.006 1.005 - 1.030   pH 7.0 5.0 - 8.0   Glucose, UA NEGATIVE NEGATIVE mg/dL   Hgb urine dipstick SMALL (A) NEGATIVE   Bilirubin Urine NEGATIVE NEGATIVE   Ketones, ur NEGATIVE NEGATIVE mg/dL   Protein, ur NEGATIVE NEGATIVE mg/dL   Nitrite NEGATIVE NEGATIVE   Leukocytes,Ua LARGE (A) NEGATIVE   RBC / HPF 0-5 0 - 5 RBC/hpf   WBC, UA >50 (H) 0 - 5 WBC/hpf   Bacteria, UA RARE (A) NONE SEEN   Squamous Epithelial / LPF 11-20 0 - 5  Urine Drug Screen, Qualitative ([facility] only)     Status: None   Collection Time: 02/13/19 10:24 PM  Result Value Ref Range   Tricyclic, Ur Screen NONE DETECTED NONE DETECTED   Amphetamines, Ur Screen NONE DETECTED NONE DETECTED   MDMA (Ecstasy)Ur Screen NONE DETECTED NONE DETECTED   Cocaine Metabolite,Ur [place] NONE DETECTED NONE DETECTED   Opiate, Ur Screen NONE DETECTED NONE DETECTED   Phencyclidine (PCP) Ur S NONE DETECTED NONE DETECTED   Cannabinoid 50 Ng, Ur [place] NONE DETECTED NONE DETECTED   Barbiturates, Ur Screen NONE DETECTED NONE DETECTED   Benzodiazepine, Ur Scrn NONE DETECTED NONE DETECTED   Methadone Scn, Ur NONE DETECTED NONE DETECTED    Assessment : Yolanda Garcia is a 23 y.o. G1P0 at [redacted]w[redacted]d being admitted for preterm labor and anticipated vaginal delivery.  Plan: Labor: Expectant management.  Induction/Augmentation as needed, per protocol FWB: Reassuring fetal heart tracing.  GBS unknown Delivery plan: Hopeful for vaginal delivery   , CNM [facility], [facility]

## 2019-02-14 NOTE — Progress Notes (Signed)
Patient ID: Yolanda Garcia, female   DOB: 1995/09/21, 21 y.o.   MRN: [id number]  Post Partum Day # 1/2, s/p spontaneous vaginal birth, Rh positive, GBS positive  Subjective:  Patient lying in bed, talking on the phone. Mother of patient at bedside, holding infant.   No questions or concerns. Denies difficulty breathing or respiratory distress, chest pain, abdominal pain, excessive vaginal bleeding, dysuria, and leg pain or swelling.   Objective:  Temp:  [97.6 F (36.4 C)-98.2 F (36.8 C)] 97.9 F (36.6 C) (09/14 1117) Pulse Rate:  [79-145] 81 (09/14 1117) Resp:  [16-20] 18 (09/14 1117) BP: (94-130)/(57-82) 115/65 (09/14 1117) SpO2:  [98 %-100 %] 98 % (09/14 0722) Weight:  [52.3 kg] 52.3 kg (09/14 0247)  Physical Exam:   General: alert and cooperative   Lungs: clear to auscultation bilaterally  Breasts: deferred, no complaints  Heart: normal apical impulse   Abdomen: soft, non-tender; bowel sounds normal; no masses,  no organomegaly  Pelvis: Lochia: appropriate, Uterine Fundus: firm  Extremities: DVT Evaluation: No evidence of DVT seen on physical exam.  Recent Labs    02/14/19 0138  HGB 10.9*  HCT 34.0*    Assessment:  23 year old, G1P1, Post Partum Day # 1/2, s/p spontaneous vaginal birth, Rh positive, GBS positive  Formula feeding  Plan:  Routine postpartum care and education.   Continue orders as written. Reassess as needed.    LOS: 0 days   Diona Fanti, CNM [facility], [facility] 02/14/2019 1:59 PM

## 2019-02-15 LAB — CBC
HCT: 24.7 % — ABNORMAL LOW (ref 36.0–46.0)
Hemoglobin: 8.3 g/dL — ABNORMAL LOW (ref 12.0–15.0)
MCH: 27.1 pg (ref 26.0–34.0)
MCHC: 33.6 g/dL (ref 30.0–36.0)
MCV: 80.7 fL (ref 80.0–100.0)
Platelets: 189 10*3/uL (ref 150–400)
RBC: 3.06 MIL/uL — ABNORMAL LOW (ref 3.87–5.11)
RDW: 12.7 % (ref 11.5–15.5)
WBC: 9.8 10*3/uL (ref 4.0–10.5)
nRBC: 0 % (ref 0.0–0.2)

## 2019-02-15 LAB — URINE CULTURE

## 2019-02-15 MED ORDER — FERROUS SULFATE 325 (65 FE) MG PO TABS
325.0000 mg | ORAL_TABLET | Freq: Three times a day (TID) | ORAL | Status: DC
  Administered 2019-02-15 – 2019-02-16 (×4): 325 mg via ORAL
  Filled 2019-02-15 (×4): qty 1

## 2019-02-15 NOTE — Progress Notes (Signed)
Patient ID: Yolanda Garcia, female   DOB: 04/13/1996, 23 y.o.   MRN: [id number]  Post Partum Day # 1, spontaneous vaginal birth, Rh positive, GBS positive  Subjective:  Patient sitting in bed, holding infant. Expressing concerns regarding parenting.   Denies difficulty breathing or respiratory distress, chest pain, abdominal pain, excessive vaginal bleeding, dysuria, and leg pain or swelling.   Objective: Temp:  [97.9 F (36.6 C)-98.9 F (37.2 C)] 98.7 F (37.1 C) (09/15 0720) Pulse Rate:  [75-89] 77 (09/15 0720) Resp:  [16-18] 18 (09/15 0720) BP: (94-115)/(60-69) 100/60 (09/15 0720) SpO2:  [98 %-99 %] 99 % (09/15 0720)  Physical Exam:   General: alert and cooperative   Lungs: clear to auscultation bilaterally  Breasts: deferred, no complaints  Heart: normal apical impulse  Abdomen: soft, non-tender; bowel sounds normal; no masses,  no organomegaly  Pelvis: Lochia: appropriate, Uterine Fundus: firm  Extremities: DVT Evaluation: No evidence of DVT seen on physical exam.  Recent Labs    02/14/19 0138 02/15/19 0545  HGB 10.9* 8.3*  HCT 34.0* 24.7*    Assessment:  23 year old, G1P1, Post Partum Day # 1/2, s/p spontaneous vaginal birth, Rh positive, GBS positive  Formula feeding  Plan:  Reassurance offered  Rx Iron supplementation, see orders  Plan for discharge tomorrow   LOS: 1 day   Diona Fanti, CNM [facility], [facility] 02/15/2019 8:30 AM

## 2019-02-16 ENCOUNTER — Ambulatory Visit: Payer: Medicaid Other | Admitting: Physical Therapy

## 2019-02-16 ENCOUNTER — Encounter: Payer: Self-pay | Admitting: Physical Therapy

## 2019-02-16 DIAGNOSIS — M6281 Muscle weakness (generalized): Secondary | ICD-10-CM

## 2019-02-16 DIAGNOSIS — M62838 Other muscle spasm: Secondary | ICD-10-CM

## 2019-02-16 DIAGNOSIS — R2689 Other abnormalities of gait and mobility: Secondary | ICD-10-CM

## 2019-02-16 MED ORDER — IBUPROFEN 600 MG PO TABS: 600.0000 mg | ORAL_TABLET | Freq: Four times a day (QID) | ORAL | 0 refills | Status: DC

## 2019-02-16 MED ORDER — FERROUS SULFATE 325 (65 FE) MG PO TABS: 325.0000 mg | ORAL_TABLET | Freq: Three times a day (TID) | ORAL | 3 refills | Status: DC

## 2019-02-16 NOTE — Discharge Summary (Signed)
                              Discharge Summary  Date of Admission: 02/13/2019  Date of Discharge: 02/16/2019  Admitting Diagnosis: Onset of Labor at [redacted]w[redacted]d  Mode of Delivery: normal spontaneous vaginal delivery                 Discharge Diagnosis: No other diagnosis   Intrapartum Procedures: none   Post partum procedures: none  Complications: none                      Discharge Day SOAP Note:  Progress Note - Vaginal Delivery  Yolanda Garcia is a 23 y.o. G1P0101 now PP day 2 s/p Vaginal, Spontaneous . Delivery was uncomplicated  Subjective  The patient has the following complaints: has no unusual complaints  Pain is controlled with current medications.   Patient is urinating without difficulty.  She is ambulating well.     Objective  Vital signs: BP 102/63 (BP Location: Right Arm)   Pulse 88   Temp 98.3 F (36.8 C) (Oral)   Resp 18   Ht 5\' 7"  (1.702 m)   Wt 52.3 kg   LMP 06/02/2018 (Exact Date)   SpO2 99%   Breastfeeding Unknown   BMI 18.06 kg/m   Physical Exam: Gen: NAD Fundus Fundal Tone: Firm @ u  Lochia Amount: Small  Perineum Appearance: Intact     Data Review Labs: CBC Latest Ref Rng & Units 02/15/2019 02/14/2019 01/04/2019  WBC 4.0 - 10.5 K/uL 9.8 10.9(H) 5.0  Hemoglobin 12.0 - 15.0 g/dL 8.3(L) 10.9(L) 9.4(L)  Hematocrit 36.0 - 46.0 % 24.7(L) 34.0(L) 28.0(L)  Platelets 150 - 400 K/uL 189 206 139(L)   A POS  Assessment/Plan  Active Problems:   Labor and delivery, indication for care    Plan for discharge today.   Discharge Instructions: Per After Visit Summary. Activity: Advance as tolerated. Pelvic rest for 6 weeks.  Also refer to After Visit Summary Diet: Regular Medications: Allergies as of 02/16/2019   No Known Allergies     Medication List    STOP taking these medications   Diclegis 10-10 MG Tbec Generic drug: Doxylamine-Pyridoxine   hydrOXYzine 25 MG tablet Commonly known as: ATARAX/VISTARIL   polyethylene glycol 17 g  packet Commonly known as: MIRALAX / GLYCOLAX     TAKE these medications   CitraNatal Bloom 90-1 MG Tabs Take 1 tablet by mouth daily.   ferrous sulfate 325 (65 FE) MG tablet Take 1 tablet (325 mg total) by mouth 3 (three) times daily with meals.   ibuprofen 600 MG tablet Commonly known as: ADVIL Take 1 tablet (600 mg total) by mouth every 6 (six) hours.   metroNIDAZOLE 500 MG tablet Commonly known as: FLAGYL Take 500 mg by mouth 2 (two) times daily.      Outpatient follow up: 2 wks with Diego Cory CNM, mood check  Postpartum contraception: plans condoms  Discharged Condition: good  Discharged to: home  Newborn Data: Disposition:home with mother  Apgars: APGAR (1 MIN): 8   APGAR (5 MINS): 9   APGAR (10 MINS):    Baby Feeding: Bottle    Philip Aspen, CNM  02/16/2019 8:15 AM

## 2019-02-16 NOTE — Final Progress Note (Signed)
Discharge Day SOAP Note:  Progress Note - Vaginal Delivery  Yolanda Garcia is a 23 y.o. G1P0101 now PP day 2 s/p Vaginal, Spontaneous . Delivery was uncomplicated  Subjective  The patient has the following complaints: has no unusual complaints  Pain is controlled with current medications.   Patient is urinating without difficulty.  She is ambulating well.     Objective  Vital signs: BP 102/63 (BP Location: Right Arm)   Pulse 88   Temp 98.3 F (36.8 C) (Oral)   Resp 18   Ht 5\' 7"  (1.702 m)   Wt 52.3 kg   LMP 06/02/2018 (Exact Date)   SpO2 99%   Breastfeeding Unknown   BMI 18.06 kg/m   Physical Exam: Gen: NAD Fundus Fundal Tone: Firm @ u  Lochia Amount: Small  Perineum Appearance: Intact     Data Review Labs: CBC Latest Ref Rng & Units 02/15/2019 02/14/2019 01/04/2019  WBC 4.0 - 10.5 K/uL 9.8 10.9(H) 5.0  Hemoglobin 12.0 - 15.0 g/dL 8.3(L) 10.9(L) 9.4(L)  Hematocrit 36.0 - 46.0 % 24.7(L) 34.0(L) 28.0(L)  Platelets 150 - 400 K/uL 189 206 139(L)   A POS  Assessment/Plan  Active Problems:   Labor and delivery, indication for care    Plan for discharge today.   Discharge Instructions: Per After Visit Summary. Activity: Advance as tolerated. Pelvic rest for 6 weeks.  Also refer to After Visit Summary Diet: Regular Medications: Allergies as of 02/16/2019   No Known Allergies     Medication List    STOP taking these medications   Diclegis 10-10 MG Tbec Generic drug: Doxylamine-Pyridoxine   hydrOXYzine 25 MG tablet Commonly known as: ATARAX/VISTARIL   polyethylene glycol 17 g packet Commonly known as: MIRALAX / GLYCOLAX     TAKE these medications   CitraNatal Bloom 90-1 MG Tabs Take 1 tablet by mouth daily.   ferrous sulfate 325 (65 FE) MG tablet Take 1 tablet (325 mg total) by mouth 3 (three) times daily with meals.   ibuprofen 600 MG tablet Commonly known as: ADVIL Take 1 tablet (600 mg total) by mouth every 6 (six) hours.   metroNIDAZOLE 500  MG tablet Commonly known as: FLAGYL Take 500 mg by mouth 2 (two) times daily.      Outpatient follow up: 2 wks with Diego Cory CNM, mood check  Postpartum contraception: plans condoms  Discharged Condition: good  Discharged to: home  Newborn Data: Disposition:home with mother  Apgars: APGAR (1 MIN): 8   APGAR (5 MINS): 9   APGAR (10 MINS):    Baby Feeding: Bottle    Philip Aspen, CNM  02/16/2019 8:15 AM

## 2019-02-16 NOTE — Therapy (Signed)
[place] SERVICES [address] [place], [place], [address] Phone: [phone]   Fax:  [phone]  Patient Details  Name: Yolanda Garcia MRN: [id number] Date of Birth: [birth year] Referring Provider:    Encounter Date: 02/16/2019  Discharge Summary    Clinical Impression Statement Pt is getting d/c at this time as pt had had her baby.    Pt achieved 3/4 STG and progressing well towards LTGs. Pt showed no back mm tightness and has maintained equal alignment of pelvic girdle. Pt reports her back pain has decreased from 8-9/10 to 3/10 and has stayed at this level across the past 2 weeks. Pt's scoliosis was addressed with stretches and strengthening HEP to minimize worsening of thoracolumbar curve. Also provided pt deep core and pelvic floor strengthening . Pt demo'd proper technique with sit to stand without back pain.  Pt was educated on proper laboring breathing technique for labor at her last appt.  Pt will benefit from post-partum pelvic rehab once cleared by her MD.  Pt has been compliant and motivation.                                  PT Long Term Goals - 02/02/19 1147      PT LONG TERM GOAL #1   Title  Pt will demo decreased R trunk posterior rotation 2/2 scoliosis and no increased mm tightness along back mm across 2 weeks in order to minimize back pain and be prepared to lift her baby as he grows heavier    Baseline  R trunk posterior rotation and back mm tightness    Time  4    Period  Weeks    Status  Achieved   Target Date  03/02/19      PT LONG TERM GOAL #2   Title  Pt will be IND with back and UE strengthening HEP and IND with bodymechanics with simulated tasks in carrying and lifting baby to minimize relapse of back pain post partum    Baseline  no IND yet    Time  8    Period  Weeks    Status Achieved   Target Date  03/30/19      PT LONG TERM GOAL #3   Title  Pt will report regular bowel movements without  straining daily to every other day ( with or without Miralax) in order to minimize pelvic floor dysfunctions    Baseline  every 2 days of bowel movements    Time  4    Period  Weeks    Status Partially met    Target Date  03/02/19        Jerl Mina ,PT, DPT, E-RYT  02/16/2019, 9:43 AM  [facility] [address] [place], [place], [address] Phone: [phone]   Fax:  [phone]

## 2019-02-16 NOTE — Progress Notes (Signed)
Pt discharged with infant.  Discharge instructions, prescriptions and follow up appointment given to and reviewed with pt. Pt verbalized understanding. Escorted out by staff. 

## 2019-02-17 ENCOUNTER — Encounter: Payer: Medicaid Other | Admitting: Certified Nurse Midwife

## 2019-02-17 ENCOUNTER — Telehealth: Payer: Self-pay | Admitting: Certified Nurse Midwife

## 2019-02-17 NOTE — Telephone Encounter (Signed)
Attempted to contact patient, voicemail not set up.  Unable to [facility], will try again later.

## 2019-02-17 NOTE — Telephone Encounter (Signed)
The patient called and stated that she needs to speak with one of her midwives because she is about to "start pumping her milk". The patient did not disclose any other information other than wanting to receive a call back. Please advise.

## 2019-02-18 NOTE — Telephone Encounter (Signed)
Attempted to contact patient, voicemail not set up.  Unable to [facility], will try again later.

## 2019-02-21 NOTE — Telephone Encounter (Signed)
Called and spoke with patient.  Patient c/o decreased appetite and crying spells.  Advised patient to come in for OV.  Patient states she can't come in until Friday.  Patient aware if symptoms get worse to call office ASAP or go to ER if it is outside of normal business hours.  Patient verbalized understanding and appointment was scheduled for 9/25 at 2:45pm.

## 2019-02-24 ENCOUNTER — Encounter: Payer: Medicaid Other | Admitting: Physical Therapy

## 2019-02-25 ENCOUNTER — Encounter: Payer: Medicaid Other | Admitting: Certified Nurse Midwife

## 2019-02-28 ENCOUNTER — Encounter: Payer: Medicaid Other | Admitting: Certified Nurse Midwife

## 2019-03-02 ENCOUNTER — Encounter: Payer: Medicaid Other | Admitting: Physical Therapy

## 2019-03-10 ENCOUNTER — Encounter: Payer: Medicaid Other | Admitting: Physical Therapy

## 2019-03-16 ENCOUNTER — Encounter: Payer: Medicaid Other | Admitting: Physical Therapy

## 2019-03-23 ENCOUNTER — Encounter: Payer: Medicaid Other | Admitting: Physical Therapy

## 2019-04-27 ENCOUNTER — Encounter: Payer: Medicaid Other | Admitting: Certified Nurse Midwife

## 2019-04-27 ENCOUNTER — Telehealth: Payer: Self-pay

## 2019-04-27 NOTE — Telephone Encounter (Signed)
mychart message sent

## 2019-05-10 ENCOUNTER — Encounter: Payer: Medicaid Other | Admitting: Certified Nurse Midwife

## 2019-05-16 ENCOUNTER — Encounter: Payer: Medicaid Other | Admitting: Certified Nurse Midwife

## 2019-05-17 ENCOUNTER — Encounter: Payer: Medicaid Other | Admitting: Certified Nurse Midwife

## 2019-06-03 NOTE — L&D Delivery Note (Addendum)
Delivery Note  In room to see patient, reports pelvic pressure and urge to push. SVE: 10/100/+2, vertex by RN at 1941. Fetal vertex visible on perineum with coached maternal pushing efforts.   Room prepared for second stage.   Spontaneous vaginal birth of liveborn female infant in right occiput anterior position followed by left compound hand over intact perineum at 1944. Tight triple nuchal cord reduced on perineum, mild dystocia resolved with suprapubic pressure. Infant immediately to maternal abdomen. Delayed cord clamping, skin to skin, and three (3) vessel cord. APGARs: 8, 9. Weight pending. Receiving nurse present at bedside for birth.   Pitocin bolus infusing. Spontaneous delivery of intact placenta at 1948. Uterus firm. Rubra small. Counts correct x 2. Vault check completed. QBL: pending. Single dose of IV stadol given for pain relief during labor, no other pain management desired.  Routine postpartum care and orders. Mom to postpartum.  Baby to Couplet care / Skin to Skin.  Patient mother and best friend present at bedside and overjoyed with the birth of "Yolanda Garcia".    Serafina Royals  [facility], [facility] 01/29/2020, 8:14 PM

## 2019-06-10 ENCOUNTER — Encounter: Payer: Medicaid Other | Admitting: Certified Nurse Midwife

## 2019-06-15 ENCOUNTER — Encounter: Payer: Medicaid Other | Admitting: Certified Nurse Midwife

## 2019-06-20 ENCOUNTER — Encounter: Payer: Self-pay | Admitting: Certified Nurse Midwife

## 2019-06-20 ENCOUNTER — Other Ambulatory Visit: Payer: Self-pay

## 2019-06-20 ENCOUNTER — Ambulatory Visit (INDEPENDENT_AMBULATORY_CARE_PROVIDER_SITE_OTHER): Payer: Medicaid Other | Admitting: Certified Nurse Midwife

## 2019-06-20 VITALS — BP 106/57 | HR 71 | Ht 67.0 in | Wt 99.4 lb

## 2019-06-20 DIAGNOSIS — Z3009 Encounter for other general counseling and advice on contraception: Secondary | ICD-10-CM | POA: Diagnosis not present

## 2019-06-20 LAB — POCT URINE PREGNANCY: Preg Test, Ur: POSITIVE — AB

## 2019-06-20 NOTE — Progress Notes (Signed)
Subjective:    Yolanda Garcia is a 24 y.o. female who presents for contraception counseling. The patient has no complaints today. The patient is sexually active. Pertinent past medical history: none.  Menstrual History: OB History    Gravida  1   Para  1   Term      Preterm  1   AB      Living  1     SAB      TAB      Ectopic      Multiple  0   Live Births  1           No LMP recorded.    The following portions of the patient's history were reviewed and updated as appropriate: allergies, current medications, past family history, past medical history, past social history, past surgical history and problem list.  Review of Systems Pertinent items are noted in HPI.   Objective:    No exam performed today, not indicated for [facility] .  UPT: positive  Assessment:    24 y.o., interested in starting [facility].   Plan:    Urine pregnancy test postive. Results reviewed with pt. Discussed options . She states " I am not having anymore babies".     Information on a Women's choice Given. Discussed follow up immediately after for [facility]. She verbalizes and agrees to plan.   I attest more than 50% of visit spent reviewing history, discussing urine results, discussing options for pregnancy. Face to face 10 min.   Doreene Burke, CNM   Doreene Burke, CNM

## 2019-07-10 ENCOUNTER — Emergency Department
Admission: EM | Admit: 2019-07-10 | Discharge: 2019-07-10 | Discharge status: Against Medical Advice | Payer: Medicaid Other

## 2019-07-10 NOTE — ED Notes (Signed)
Pt not visualized in ED, in restroom, or outside. Pt called for triage x 3.

## 2019-07-11 ENCOUNTER — Telehealth: Payer: Self-pay

## 2019-07-11 ENCOUNTER — Telehealth: Payer: Self-pay | Admitting: Certified Nurse Midwife

## 2019-07-11 NOTE — Telephone Encounter (Signed)
Called patient and she reports she is having slight spotting and some lower pelvic cramping. Patient states she has recent intercourse. Patient was advised to put a pad on to monitor bleeding, use tylenol, hydrate and rest as much as possible. She was instructed to send a mychart message in an hour or 2 to update. Also gave patient the option to go to the ED to be evaluated for viability. Patient states she will do the things we talked about and will let me know.

## 2019-07-11 NOTE — Telephone Encounter (Signed)
Pt called in and stated that she has vagina bleeding and pelvic crump's. Pt stated that she thinks she is having a miscarriage. Called back to the nurse and was advise to send a message. Please advise

## 2019-07-12 ENCOUNTER — Emergency Department: Payer: Medicaid Other

## 2019-07-12 ENCOUNTER — Emergency Department
Admission: EM | Admit: 2019-07-12 | Discharge: 2019-07-13 | Disposition: A | Discharge status: Home / Self Care | Payer: Medicaid Other | Attending: Emergency Medicine | Admitting: Emergency Medicine

## 2019-07-12 ENCOUNTER — Other Ambulatory Visit: Payer: Self-pay

## 2019-07-12 ENCOUNTER — Telehealth: Payer: Self-pay

## 2019-07-12 DIAGNOSIS — Z3A09 9 weeks gestation of pregnancy: Secondary | ICD-10-CM | POA: Diagnosis not present

## 2019-07-12 DIAGNOSIS — Z3491 Encounter for supervision of normal pregnancy, unspecified, first trimester: Secondary | ICD-10-CM

## 2019-07-12 DIAGNOSIS — O4690 Antepartum hemorrhage, unspecified, unspecified trimester: Secondary | ICD-10-CM

## 2019-07-12 DIAGNOSIS — O23591 Infection of other part of genital tract in pregnancy, first trimester: Secondary | ICD-10-CM | POA: Insufficient documentation

## 2019-07-12 DIAGNOSIS — O209 Hemorrhage in early pregnancy, unspecified: Secondary | ICD-10-CM | POA: Diagnosis present

## 2019-07-12 DIAGNOSIS — O0001 Abdominal pregnancy with intrauterine pregnancy: Secondary | ICD-10-CM | POA: Insufficient documentation

## 2019-07-12 DIAGNOSIS — B9689 Other specified bacterial agents as the cause of diseases classified elsewhere: Secondary | ICD-10-CM

## 2019-07-12 LAB — CBC WITH DIFFERENTIAL/PLATELET
Abs Immature Granulocytes: 0.01 10*3/uL (ref 0.00–0.07)
Basophils Absolute: 0 10*3/uL (ref 0.0–0.1)
Basophils Relative: 1 %
Eosinophils Absolute: 0 10*3/uL (ref 0.0–0.5)
Eosinophils Relative: 1 %
HCT: 32.7 % — ABNORMAL LOW (ref 36.0–46.0)
Hemoglobin: 10.9 g/dL — ABNORMAL LOW (ref 12.0–15.0)
Immature Granulocytes: 0 %
Lymphocytes Relative: 38 %
Lymphs Abs: 1.4 10*3/uL (ref 0.7–4.0)
MCH: 25.1 pg — ABNORMAL LOW (ref 26.0–34.0)
MCHC: 33.3 g/dL (ref 30.0–36.0)
MCV: 75.2 fL — ABNORMAL LOW (ref 80.0–100.0)
Monocytes Absolute: 0.2 10*3/uL (ref 0.1–1.0)
Monocytes Relative: 7 %
Neutro Abs: 1.9 10*3/uL (ref 1.7–7.7)
Neutrophils Relative %: 53 %
Platelets: 219 10*3/uL (ref 150–400)
RBC: 4.35 MIL/uL (ref 3.87–5.11)
RDW: 15.1 % (ref 11.5–15.5)
WBC: 3.6 10*3/uL — ABNORMAL LOW (ref 4.0–10.5)
nRBC: 0 % (ref 0.0–0.2)

## 2019-07-12 LAB — BASIC METABOLIC PANEL
Anion gap: 6 (ref 5–15)
BUN: 9 mg/dL (ref 6–20)
CO2: 27 mmol/L (ref 22–32)
Calcium: 9.3 mg/dL (ref 8.9–10.3)
Chloride: 103 mmol/L (ref 98–111)
Creatinine, Ser: 0.68 mg/dL (ref 0.44–1.00)
GFR calc Af Amer: >60 mL/min (ref >60)
GFR calc non Af Amer: >60 mL/min (ref >60)
Glucose, Bld: 110 mg/dL — ABNORMAL HIGH (ref 70–99)
Potassium: 3.5 mmol/L (ref 3.5–5.1)
Sodium: 136 mmol/L (ref 135–145)

## 2019-07-12 LAB — URINALYSIS, COMPLETE (UACMP) WITH MICROSCOPIC
Bacteria, UA: NONE SEEN
Bilirubin Urine: NEGATIVE
Glucose, UA: NEGATIVE mg/dL
Hgb urine dipstick: NEGATIVE
Ketones, ur: NEGATIVE mg/dL
Nitrite: NEGATIVE
Protein, ur: NEGATIVE mg/dL
Specific Gravity, Urine: 1.018 (ref 1.005–1.030)
pH: 6 (ref 5.0–8.0)

## 2019-07-12 LAB — WET PREP, GENITAL
Sperm: NONE SEEN
Trich, Wet Prep: NONE SEEN
Yeast Wet Prep HPF POC: NONE SEEN

## 2019-07-12 LAB — CHLAMYDIA/NGC RT PCR (ARMC ONLY)
Chlamydia Tr: NOT DETECTED
N gonorrhoeae: NOT DETECTED

## 2019-07-12 LAB — HCG, QUANTITATIVE, PREGNANCY: hCG, Beta Chain, Quant, S: 182473 m[IU]/mL — ABNORMAL HIGH (ref <5)

## 2019-07-12 LAB — POCT PREGNANCY, URINE: Preg Test, Ur: POSITIVE — AB

## 2019-07-12 MED ORDER — ONDANSETRON 4 MG PO TBDP: 4.0000 mg | ORAL_TABLET | Freq: Three times a day (TID) | ORAL | 0 refills | Status: DC | PRN

## 2019-07-12 MED ORDER — METRONIDAZOLE 500 MG PO TABS
500.0000 mg | ORAL_TABLET | Freq: Once | ORAL | Status: AC
  Administered 2019-07-12: 500 mg via ORAL
  Filled 2019-07-12: qty 1

## 2019-07-12 MED ORDER — METRONIDAZOLE 500 MG PO TABS: 500.0000 mg | ORAL_TABLET | Freq: Two times a day (BID) | ORAL | 0 refills | Status: DC

## 2019-07-12 MED ORDER — ONDANSETRON 4 MG PO TBDP
4.0000 mg | ORAL_TABLET | Freq: Once | ORAL | Status: AC
  Administered 2019-07-12: 23:00:00 4 mg via ORAL
  Filled 2019-07-12: qty 1

## 2019-07-12 NOTE — Discharge Instructions (Addendum)
Your exam, labs, and [place] are all normal at this time. You have a pregnancy measuring at 9 weeks and 5 days. You are being treated for nausea & vomiting related to pregnancy. You are also being treated for BV. Take the prescription meds as directed. Follow-up with your OB provider for ongoing treatment.

## 2019-07-12 NOTE — Telephone Encounter (Signed)
Attempted to contact patient but voicemail is not setup. Patient needs to go to the ED.

## 2019-07-12 NOTE — ED Provider Notes (Signed)
[facility] Emergency Department Provider Note ____________________________________________  Time seen: 2201  I have reviewed the triage vital signs and the nursing notes.  HISTORY  Chief Complaint  Vaginal Bleeding   HPI Yolanda Garcia is a 24 y.o.G2P1 female presents her self to the ED for evaluation of a 1 day complaint of some scant vaginal spotting last week.  Patient apparently was notified that she had a positive pregnancy test when she reported to her OB provider in December, for a Depo shot.  The patient at that time was 3 months postpartum from a singleton delivery.  Parent was also started on metronidazole for a BV infection.  She presents now complaining of nausea some mild cramping, but denies any abnormal vaginal bleeding.  She does admit to not completing the previously prescribed antibiotic.  She reports that she had intended intermittent in the pregnancy, but has not been able to come up with the needed findings for the D&E procedure.  She denies any fever, chills, or sweats.   Past Medical History:  Diagnosis Date  . Anemia   . Panic attack     Patient Active Problem List   Diagnosis Date Noted  . Labor and delivery, indication for care 02/13/2019  . Indication for care in labor and delivery, antepartum 01/29/2019  . Nausea/vomiting in pregnancy 01/25/2019  . Constipation during pregnancy in third trimester 01/25/2019  . Pelvic pressure in pregnancy 01/25/2019  . Anemia affecting first pregnancy 01/05/2019  . Sickle cell trait (HCC) 08/23/2018    Past Surgical History:  Procedure Laterality Date  . NO PAST SURGERIES      Prior to Admission medications   Medication Sig Start Date End Date Taking? Authorizing Provider  ferrous sulfate 325 (65 FE) MG tablet Take 1 tablet (325 mg total) by mouth 3 (three) times daily with meals. 02/16/19   Doreene Burke, CNM  metroNIDAZOLE (FLAGYL) 500 MG tablet Take 1 tablet (500 mg total) by mouth 2  (two) times daily for 7 days. 07/13/19 07/20/19  Pantelis Elgersma, Charlesetta Ivory, PA-C  ondansetron (ZOFRAN ODT) 4 MG disintegrating tablet Take 1 tablet (4 mg total) by mouth every 8 (eight) hours as needed. 07/12/19   Vuong Musa, Charlesetta Ivory, PA-C  Prenatal-DSS-FeCb-FeGl-FA (CITRANATAL BLOOM) 90-1 MG TABS Take 1 tablet by mouth daily. 01/05/19   Purcell Nails, CNM    Allergies Patient has no known allergies.  Family History  Problem Relation Age of Onset  . Healthy Mother     Social History Social History   Tobacco Use  . Smoking status: Never Smoker  . Smokeless tobacco: Never Used  Substance Use Topics  . Alcohol use: No  . Drug use: Never    Review of Systems  Constitutional: Negative for fever. Cardiovascular: Negative for chest pain. Respiratory: Negative for shortness of breath. Gastrointestinal: Negative for abdominal pain, vomiting and diarrhea.  Reports nausea as above. Genitourinary: Negative for dysuria.  Reports resolved vaginal bleeding. Musculoskeletal: Negative for back pain. Skin: Negative for rash. Neurological: Negative for headaches, focal weakness or numbness. ____________________________________________  PHYSICAL EXAM:  VITAL SIGNS: ED Triage Vitals  Enc Vitals Group     BP 07/12/19 2014 (!) 109/59     Pulse Rate 07/12/19 2014 90     Resp 07/12/19 2014 18     Temp 07/12/19 2014 97.8 F (36.6 C)     Temp src --      SpO2 07/12/19 2014 100 %     Weight 07/12/19 2015 98  lb (44.5 kg)     Height 07/12/19 2015 5\' 6"  (1.676 m)     Head Circumference --      Peak Flow --      Pain Score 07/12/19 2014 9     Pain Loc --      Pain Edu? --      Excl. in [place]? --     Constitutional: Alert and oriented. Well appearing and in no distress. Head: Normocephalic and atraumatic. Eyes: Conjunctivae are normal. Normal extraocular movements Cardiovascular: Normal rate, regular rhythm. Normal distal pulses. Respiratory: Normal respiratory effort. No  wheezes/rales/rhonchi. Gastrointestinal: Soft and nontender. No distention. GU: Normal external genitalia.  Scant white discharge in the vagina.  Cervix is closed. Musculoskeletal: Nontender with normal range of motion in all extremities.  Neurologic:  Normal gait without ataxia. Normal speech and language. No gross focal neurologic deficits are appreciated. Skin:  Skin is warm, dry and intact. No rash noted. ____________________________________________   LABS (pertinent positives/negatives) Labs Reviewed  WET PREP, GENITAL - Abnormal; Notable for the following components:      Result Value   Clue Cells Wet Prep HPF POC PRESENT (*)    WBC, Wet Prep HPF POC FEW (*)    All other components within normal limits  HCG, QUANTITATIVE, PREGNANCY - Abnormal; Notable for the following components:   hCG, Beta Chain, Quant, S 182,473 (*)    All other components within normal limits  BASIC METABOLIC PANEL - Abnormal; Notable for the following components:   Glucose, Bld 110 (*)    All other components within normal limits  CBC WITH DIFFERENTIAL/PLATELET - Abnormal; Notable for the following components:   WBC 3.6 (*)    Hemoglobin 10.9 (*)    HCT 32.7 (*)    MCV 75.2 (*)    MCH 25.1 (*)    All other components within normal limits  URINALYSIS, COMPLETE (UACMP) WITH MICROSCOPIC - Abnormal; Notable for the following components:   Color, Urine YELLOW (*)    APPearance HAZY (*)    Leukocytes,Ua SMALL (*)    All other components within normal limits  POCT PREGNANCY, URINE - Abnormal; Notable for the following components:   Preg Test, Ur POSITIVE (*)    All other components within normal limits  CHLAMYDIA/NGC RT PCR ([facility] ONLY)  POC URINE PREG, ED  ____________________________________________   RADIOLOGY  OB US < 14 wks w/ Transvag  IMPRESSION: Single live intrauterine pregnancy measuring 9 weeks 5 days. Small subchorionic  hemorrhage. ____________________________________________  PROCEDURES  Zofran 4 mg ODT  Procedures ____________________________________________  INITIAL IMPRESSION / ASSESSMENT AND PLAN / ED COURSE  Female patient with ED evaluation of resolved vaginal bleeding cramping.  Patient was found to have a single IUP measuring at 9 weeks and 5 days on ultrasound.  He also had a small subchorionic hemorrhage.  Her wet prep showed BV but her labs are otherwise reassuring and benign at this time.  Patient will be treated for her hyperemesis with a prescription for Zofran.  She will also be discharged with prescription for metronidazole take as directed for BV.  She will follow with the primary OB provider for ongoing symptoms or a local provider for pregnancy termination.  Return precautions have been reviewed.  Yolanda Garcia was evaluated in Emergency Department on 07/13/2019 for the symptoms described in the history of present illness. She was evaluated in the context of the global COVID-19 pandemic, which necessitated consideration that the patient might be at risk for infection with  the SARS-CoV-2 virus that causes COVID-19. Institutional protocols and algorithms that pertain to the evaluation of patients at risk for COVID-19 are in a state of rapid change based on information released by regulatory bodies including the CDC and federal and state organizations. These policies and algorithms were followed during the patient's care in the ED. ____________________________________________  FINAL CLINICAL IMPRESSION(S) / ED DIAGNOSES  Final diagnoses:  Normal intrauterine pregnancy on prenatal ultrasound in first trimester  Vaginal bleeding in pregnancy  BV (bacterial vaginosis)      Lissa Hoard, PA-C 07/13/19 0053    Shaune Pollack, MD 07/15/19 [phone]

## 2019-07-12 NOTE — Telephone Encounter (Signed)
Pt called in yesterday  and stated that she has vagina bleeding and pelvic crump's. Pt stated that she thinks she is having a miscarriage. Called back to the nurse and was advise to send a message.   The pt called back today and stated that she wants to be seen.  Please advise

## 2019-07-12 NOTE — ED Triage Notes (Signed)
Pt to the er for vaginal bleeding and cramping during pregnancy x 1 week. Pt thinks she is having a miscarriage. Pt reports also passing unknown tissue. Pt had planned to have an abortion but is unsure. Pt is tolerating well.

## 2019-07-14 ENCOUNTER — Other Ambulatory Visit: Payer: Self-pay

## 2019-07-14 ENCOUNTER — Telehealth: Payer: Self-pay | Admitting: Certified Nurse Midwife

## 2019-07-14 MED ORDER — METRONIDAZOLE 500 MG PO TABS: 500.0000 mg | ORAL_TABLET | Freq: Two times a day (BID) | ORAL | 0 refills | Status: AC

## 2019-07-14 MED ORDER — ONDANSETRON 4 MG PO TBDP: 4.0000 mg | ORAL_TABLET | Freq: Three times a day (TID) | ORAL | 0 refills | Status: DC | PRN

## 2019-07-14 NOTE — Telephone Encounter (Signed)
Pt is requesting a call back from the nurse. The pt stated that she is trying to explain what refill she needs, to the nurse via mychart. Please advise

## 2019-07-31 ENCOUNTER — Other Ambulatory Visit: Payer: Self-pay

## 2019-08-01 MED ORDER — ONDANSETRON 4 MG PO TBDP: 4.0000 mg | ORAL_TABLET | Freq: Three times a day (TID) | ORAL | 0 refills | Status: DC | PRN

## 2019-08-11 ENCOUNTER — Emergency Department
Admission: EM | Admit: 2019-08-11 | Discharge: 2019-08-11 | Disposition: A | Discharge status: Home / Self Care | Payer: Medicaid Other | Attending: Emergency Medicine | Admitting: Emergency Medicine

## 2019-08-11 ENCOUNTER — Inpatient Hospital Stay (HOSPITAL_COMMUNITY)
Admission: AD | Admit: 2019-08-11 | Discharge: 2019-08-11 | Disposition: A | Discharge status: Home / Self Care | Payer: Medicaid Other | Attending: Obstetrics and Gynecology | Admitting: Obstetrics and Gynecology

## 2019-08-11 ENCOUNTER — Encounter: Payer: Self-pay | Admitting: Emergency Medicine

## 2019-08-11 ENCOUNTER — Encounter (HOSPITAL_COMMUNITY): Payer: Self-pay | Admitting: Obstetrics and Gynecology

## 2019-08-11 ENCOUNTER — Telehealth: Payer: Self-pay | Admitting: Certified Nurse Midwife

## 2019-08-11 ENCOUNTER — Other Ambulatory Visit: Payer: Self-pay

## 2019-08-11 DIAGNOSIS — Z3492 Encounter for supervision of normal pregnancy, unspecified, second trimester: Secondary | ICD-10-CM

## 2019-08-11 DIAGNOSIS — Z5321 Procedure and treatment not carried out due to patient leaving prior to being seen by health care provider: Secondary | ICD-10-CM | POA: Diagnosis not present

## 2019-08-11 DIAGNOSIS — O208 Other hemorrhage in early pregnancy: Secondary | ICD-10-CM | POA: Diagnosis not present

## 2019-08-11 DIAGNOSIS — O209 Hemorrhage in early pregnancy, unspecified: Secondary | ICD-10-CM | POA: Diagnosis present

## 2019-08-11 DIAGNOSIS — N939 Abnormal uterine and vaginal bleeding, unspecified: Secondary | ICD-10-CM | POA: Diagnosis present

## 2019-08-11 DIAGNOSIS — Z3A14 14 weeks gestation of pregnancy: Secondary | ICD-10-CM | POA: Diagnosis not present

## 2019-08-11 DIAGNOSIS — O26851 Spotting complicating pregnancy, first trimester: Secondary | ICD-10-CM | POA: Diagnosis not present

## 2019-08-11 LAB — BASIC METABOLIC PANEL
Anion gap: 4 — ABNORMAL LOW (ref 5–15)
BUN: 7 mg/dL (ref 6–20)
CO2: 25 mmol/L (ref 22–32)
Calcium: 9.1 mg/dL (ref 8.9–10.3)
Chloride: 105 mmol/L (ref 98–111)
Creatinine, Ser: 0.39 mg/dL — ABNORMAL LOW (ref 0.44–1.00)
GFR calc Af Amer: >60 mL/min (ref >60)
GFR calc non Af Amer: >60 mL/min (ref >60)
Glucose, Bld: 82 mg/dL (ref 70–99)
Potassium: 3.4 mmol/L — ABNORMAL LOW (ref 3.5–5.1)
Sodium: 134 mmol/L — ABNORMAL LOW (ref 135–145)

## 2019-08-11 LAB — URINALYSIS, ROUTINE W REFLEX MICROSCOPIC
Bacteria, UA: NONE SEEN
Bilirubin Urine: NEGATIVE
Glucose, UA: NEGATIVE mg/dL
Ketones, ur: NEGATIVE mg/dL
Leukocytes,Ua: NEGATIVE
Nitrite: NEGATIVE
Protein, ur: NEGATIVE mg/dL
Specific Gravity, Urine: 1.015 (ref 1.005–1.030)
pH: 6 (ref 5.0–8.0)

## 2019-08-11 LAB — CBC
HCT: 28.8 % — ABNORMAL LOW (ref 36.0–46.0)
Hemoglobin: 9.5 g/dL — ABNORMAL LOW (ref 12.0–15.0)
MCH: 26.2 pg (ref 26.0–34.0)
MCHC: 33 g/dL (ref 30.0–36.0)
MCV: 79.6 fL — ABNORMAL LOW (ref 80.0–100.0)
Platelets: 183 10*3/uL (ref 150–400)
RBC: 3.62 MIL/uL — ABNORMAL LOW (ref 3.87–5.11)
RDW: 15.3 % (ref 11.5–15.5)
WBC: 4 10*3/uL (ref 4.0–10.5)
nRBC: 0 % (ref 0.0–0.2)

## 2019-08-11 LAB — WET PREP, GENITAL
Sperm: NONE SEEN
Trich, Wet Prep: NONE SEEN
Yeast Wet Prep HPF POC: NONE SEEN

## 2019-08-11 LAB — HCG, QUANTITATIVE, PREGNANCY: hCG, Beta Chain, Quant, S: 88950 m[IU]/mL — ABNORMAL HIGH (ref <5)

## 2019-08-11 NOTE — Progress Notes (Signed)
GC/Chlamydia & wet prep vaginal cultures obtained by this RN.

## 2019-08-11 NOTE — MAU Note (Signed)
+  test at "[facility]" in [place].  Started bleeding during the night, "woke up and cover was filled with blood". Just put her 2nd pad on,  First wasn't filled.  Is cramping.  Blood work drawn in ER, sent up for eval.

## 2019-08-11 NOTE — ED Triage Notes (Signed)
Pt presents to ED via POV with c/o vaginal bleeding. Pt states woke up this morning with noted blood on her blankets. Pt states went to the bathroom this morning and noted to have bled through the pad. Pt states was seen at [facility] and told she was pregnant, states she was going to get an abortion,  unknown how far along, pt also seen here on 2/9 for same. Pt A&O x 4, NAD noted at time of arrival to ED.   Pt states bleeding started when she was asleep.

## 2019-08-11 NOTE — Discharge Instructions (Signed)
Vaginal Bleeding During Pregnancy, First Trimester  A small amount of bleeding (spotting) from the vagina is common during early pregnancy. Sometimes the bleeding is normal and does not cause problems. At other times, though, bleeding may be a sign of something serious. Tell your doctor about any bleeding from your vagina right away. Follow these instructions at home: Activity  Follow your doctor's instructions about how active you can be.  If needed, make plans for someone to help with your normal activities.  Do not have sex or orgasms until your doctor says that this is safe. General instructions  Take over-the-counter and prescription medicines only as told by your doctor.  Watch your condition for any changes.  Write down: ? The number of pads you use each day. ? How often you change pads. ? How soaked (saturated) your pads are.  Do not use tampons.  Do not douche.  If you pass any tissue from your vagina, save it to show to your doctor.  Keep all follow-up visits as told by your doctor. This is important. Contact a doctor if:  You have vaginal bleeding at any time while you are pregnant.  You have cramps.  You have a fever. Get help right away if:  You have very bad cramps in your back or belly (abdomen).  You pass large clots or a lot of tissue from your vagina.  Your bleeding gets worse.  You feel light-headed.  You feel weak.  You pass out (faint).  You have chills.  You are leaking fluid from your vagina.  You have a gush of fluid from your vagina. Summary  Sometimes vaginal bleeding during pregnancy is normal and does not cause problems. At other times, bleeding may be a sign of something serious.  Tell your doctor about any bleeding from your vagina right away.  Follow your doctor's instructions about how active you can be. You may need someone to help you with your normal activities. This information is not intended to replace advice given to  you by your health care provider. Make sure you discuss any questions you have with your health care provider. Document Revised: 09/07/2018 Document Reviewed: 08/20/2016 Elsevier Patient Education  2020 [employer]   Subchorionic Hematoma  A subchorionic hematoma is a gathering of blood between the outer wall of the embryo (chorion) and the inner wall of the womb (uterus). This condition can cause vaginal bleeding. If they cause little or no vaginal bleeding, early small hematomas usually shrink on their own and do not affect your baby or pregnancy. When bleeding starts later in pregnancy, or if the hematoma is larger or occurs in older pregnant women, the condition may be more serious. Larger hematomas may get bigger, which increases the chances of miscarriage. This condition also increases the risk of:  Premature separation of the placenta from the uterus.  Premature (preterm) labor.  Stillbirth. What are the causes? The exact cause of this condition is not known. It occurs when blood is trapped between the placenta and the uterine wall because the placenta has separated from the original site of implantation. What increases the risk? You are more likely to develop this condition if:  You were treated with fertility medicines.  You conceived through in vitro fertilization (IVF). What are the signs or symptoms? Symptoms of this condition include:  Vaginal spotting or bleeding.  Contractions of the uterus. These cause abdominal pain. Sometimes you may have no symptoms and the bleeding may only be seen when  ultrasound images are taken (transvaginal ultrasound). How is this diagnosed? This condition is diagnosed based on a physical exam. This includes a pelvic exam. You may also have other tests, including:  Blood tests.  Urine tests.  Ultrasound of the abdomen. How is this treated? Treatment for this condition can vary. Treatment may include:  Watchful waiting. You will be  monitored closely for any changes in bleeding. During this stage: ? The hematoma may be reabsorbed by the body. ? The hematoma may separate the fluid-filled space containing the embryo (gestational sac) from the wall of the womb (endometrium).  Medicines.  Activity restriction. This may be needed until the bleeding stops. Follow these instructions at home:  Stay on bed rest if told to do so by your health care provider.  Do not lift anything that is heavier than 10 lbs. (4.5 kg) or as told by your health care provider.  Do not use any products that contain nicotine or tobacco, such as cigarettes and e-cigarettes. If you need help quitting, ask your health care provider.  Track and write down the number of pads you use each day and how soaked (saturated) they are.  Do not use tampons.  Keep all follow-up visits as told by your health care provider. This is important. Your health care provider may ask you to have follow-up blood tests or ultrasound tests or both. Contact a health care provider if:  You have any vaginal bleeding.  You have a fever. Get help right away if:  You have severe cramps in your stomach, back, abdomen, or pelvis.  You pass large clots or tissue. Save any tissue for your health care provider to look at.  You have more vaginal bleeding, and you faint or become lightheaded or weak. Summary  A subchorionic hematoma is a gathering of blood between the outer wall of the placenta and the uterus.  This condition can cause vaginal bleeding.  Sometimes you may have no symptoms and the bleeding may only be seen when ultrasound images are taken.  Treatment may include watchful waiting, medicines, or activity restriction. This information is not intended to replace advice given to you by your health care provider. Make sure you discuss any questions you have with your health care provider. Document Revised: 05/01/2017 Document Reviewed: 07/15/2016 Elsevier Patient  Education  2020 [employer].

## 2019-08-11 NOTE — Telephone Encounter (Signed)
Called and spoke with patient.  Patient was evaluated at [facility] today and sent home, positive fetal heart tones.  Patient request appointment to follow-up.  Bleeding precautions given, appointment set up with Serafina Royals, CNM on 3/12 at 4:30 pm and patient verbalized understanding.

## 2019-08-11 NOTE — ED Provider Notes (Signed)
This is a 24 year old female presenting complaining of vaginal bleeding and abdominal cramping.  She report her last menstrual period was back in December.  She report having a positive pregnancy test in January.  She is here due to vaginal bleeding this morning as well as lower abdominal cramping.  No other complaint.  Will transfer patient over to MAU for further evaluation of her pregnancy complication.  1:19 PM MAU APP agrees to accept pt for further evaluation.    Fayrene Helper, PA-C 08/11/19 1319    Benjiman Core, MD 08/11/19 1500

## 2019-08-11 NOTE — MAU Provider Note (Addendum)
History     CSN: [id number]  Arrival date and time: 08/11/19 1253   First Provider Initiated Contact with Patient 08/11/19 1500     Chief Complaint  Patient presents with  . Vaginal Bleeding  . Abdominal Pain   HPI Yolanda Garcia is a 24 y.o. G2P0101 at [redacted]w[redacted]d by early [place] who presents to [facility]U from [facility] for evaluation of vaginal bleeding and abdominal cramping. These are new problems, onset overnight last night. She denies abdominal pain upon arrival to MAU and states her bleeding is "hardly anything now". She denies dysuria, weakness, syncope or recent illness. She is remote from intercourse.   Patient declines pelvic exam. She verbalizes to CNM and bedside RN that she has an appointment for termination Monday 08/15/2019 and "just wanted to know if there was a heartbeat because my doctor told me I might have a miscarriage and wouldn't need an abortion".   Patient receives care with [facility].  OB History    Gravida  2   Para  1   Term      Preterm  1   AB      Living  1     SAB      TAB      Ectopic      Multiple  0   Live Births  1           Past Medical History:  Diagnosis Date  . Anemia   . Panic attack     Past Surgical History:  Procedure Laterality Date  . NO PAST SURGERIES      Family History  Problem Relation Age of Onset  . Healthy Mother     Social History   Tobacco Use  . Smoking status: Never Smoker  . Smokeless tobacco: Never Used  Substance Use Topics  . Alcohol use: No  . Drug use: Never    Allergies: No Known Allergies  Medications Prior to Admission  Medication Sig Dispense Refill Last Dose  . metroNIDAZOLE (FLAGYL) 500 MG tablet Take 500 mg by mouth 2 (two) times daily. Filled Rx but hasn't started taking meds     . ferrous sulfate 325 (65 FE) MG tablet Take 1 tablet (325 mg total) by mouth 3 (three) times daily with meals. 90 tablet 3   . ondansetron (ZOFRAN ODT) 4 MG disintegrating tablet Take 1 tablet (4 mg  total) by mouth every 8 (eight) hours as needed. 30 tablet 0 08/09/2019  . Prenatal-DSS-FeCb-FeGl-FA (CITRANATAL BLOOM) 90-1 MG TABS Take 1 tablet by mouth daily. 30 tablet 11     Review of Systems  Constitutional: Negative for chills, fatigue and fever.  Respiratory: Negative for shortness of breath.   Gastrointestinal: Positive for abdominal pain.  Genitourinary: Positive for vaginal bleeding. Negative for dysuria.  Musculoskeletal: Negative for back pain.  Neurological: Negative for headaches.  All other systems reviewed and are negative.  Physical Exam   Blood pressure 95/83, pulse 72, temperature 97.8 F (36.6 C), resp. rate 16, height 5\' 6"  (1.676 m), weight 44 kg, last menstrual period 05/27/2019, SpO2 100 %, unknown if currently breastfeeding.  Physical Exam  Nursing note and vitals reviewed. Constitutional: She is oriented to person, place, and time. She appears well-developed and well-nourished.  Cardiovascular: Normal rate.  Respiratory: Effort normal and breath sounds normal.  GI: Soft. She exhibits no distension and no mass. There is abdominal tenderness. There is no rebound and no guarding.  Mild suprapubic tenderness  Genitourinary:    Genitourinary  Comments: Speculum and bimanual exams declined by patient. No bleeding noted on pad worn to MAU   Neurological: She is alert and oriented to person, place, and time.  Skin: Skin is warm and dry.  Psychiatric: She has a normal mood and affect. Her behavior is normal. Judgment and thought content normal.    MAU Course/MDM  Procedures  --On arrival from [facility] patient declines pelvic exam. She verbalizes understanding that pelvic exam is important component of understanding severity and source of bleeding.  --IUP confirmed during ED visit at [facility] 07/12/2019. Subchorionic hematoma noted. Patient not aware of this diagnosis. Restated recommendations for pelvic rest.  --Patient is hand carrying prescription bottle for Flagyl.  States she was prescribed for BV but has not started taking prescription.   Patient Vitals for the past 24 hrs:  BP Temp Pulse Resp SpO2 Height Weight  08/11/19 1435 95/83 -- 72 16 100 % 5\' 6"  (1.676 m) 44 kg  08/11/19 1300 (!) 111/56 97.8 F (36.6 C) 93 16 96 % -- --   Results for orders placed or performed during the hospital encounter of 08/11/19 (from the past 24 hour(s))  ABO/Rh     Status: None   Collection Time: 08/11/19  2:24 PM  Result Value Ref Range   ABO/RH(D)      A POS Performed at [facility], [address], [place], [place]   Urinalysis, Routine w reflex microscopic     Status: Abnormal   Collection Time: 08/11/19  3:06 PM  Result Value Ref Range   Color, Urine YELLOW YELLOW   APPearance HAZY (A) CLEAR   Specific Gravity, Urine 1.015 1.005 - 1.030   pH 6.0 5.0 - 8.0   Glucose, UA NEGATIVE NEGATIVE mg/dL   Hgb urine dipstick MODERATE (A) NEGATIVE   Bilirubin Urine NEGATIVE NEGATIVE   Ketones, ur NEGATIVE NEGATIVE mg/dL   Protein, ur NEGATIVE NEGATIVE mg/dL   Nitrite NEGATIVE NEGATIVE   Leukocytes,Ua NEGATIVE NEGATIVE   RBC / HPF 6-10 0 - 5 RBC/hpf   WBC, UA 0-5 0 - 5 WBC/hpf   Bacteria, UA NONE SEEN NONE SEEN   Squamous Epithelial / LPF 11-20 0 - 5   Mucus PRESENT   Wet prep, genital     Status: Abnormal   Collection Time: 08/11/19  3:08 PM   Specimen: Vaginal  Result Value Ref Range   Yeast Wet Prep HPF POC NONE SEEN NONE SEEN   Trich, Wet Prep NONE SEEN NONE SEEN   Clue Cells Wet Prep HPF POC PRESENT (A) NONE SEEN   WBC, Wet Prep HPF POC FEW (A) NONE SEEN   Sperm NONE SEEN    Assessment and Plan  --24 y.o. G2P0101 at [redacted]w[redacted]d by early [redacted]w[redacted]d --FHT 140 by Doppler --Vaginal bleeding, known subchorionic hematoma --Pelvic exam declined by patient --Blood type A POS --Discharge home in stable condition, bleeding precautions  F/U: --Patient has made a follow-up appt with [facility] tomorrow 08/12/2019 --Patient has appt to terminate  pregnancy 08/15/2019  08/17/2019, CNM 08/11/2019, 4:04 PM

## 2019-08-11 NOTE — Telephone Encounter (Signed)
Patient called saying she is experiencing  vaginal bleeding as well as abdominal pain. Patient is [redacted] wks pregnant.

## 2019-08-12 ENCOUNTER — Encounter: Payer: Medicaid Other | Admitting: Certified Nurse Midwife

## 2019-08-12 LAB — GC/CHLAMYDIA PROBE AMP (~~LOC~~) NOT AT ARMC
Chlamydia: NEGATIVE
Comment: NEGATIVE
Comment: NORMAL
Neisseria Gonorrhea: NEGATIVE

## 2019-08-12 LAB — ABO/RH: ABO/RH(D): A POS

## 2019-08-15 ENCOUNTER — Ambulatory Visit (INDEPENDENT_AMBULATORY_CARE_PROVIDER_SITE_OTHER): Payer: Medicaid Other | Admitting: Certified Nurse Midwife

## 2019-08-15 ENCOUNTER — Other Ambulatory Visit: Payer: Self-pay

## 2019-08-15 VITALS — BP 94/48 | HR 92 | Wt 99.0 lb

## 2019-08-15 DIAGNOSIS — O4692 Antepartum hemorrhage, unspecified, second trimester: Secondary | ICD-10-CM | POA: Diagnosis not present

## 2019-08-15 DIAGNOSIS — O200 Threatened abortion: Secondary | ICD-10-CM

## 2019-08-15 NOTE — Patient Instructions (Signed)
Etonogestrel implant What is this medicine? ETONOGESTREL (et oh noe JES trel) is a contraceptive (birth control) device. It is used to prevent pregnancy. It can be used for up to 3 years. This medicine may be used for other purposes; ask your health care provider or pharmacist if you have questions. COMMON BRAND NAME(S): Implanon, Nexplanon What should I tell my health care provider before I take this medicine? They need to know if you have any of these conditions:  abnormal vaginal bleeding  blood vessel disease or blood clots  breast, cervical, endometrial, ovarian, liver, or uterine cancer  diabetes  gallbladder disease  heart disease or recent heart attack  high blood pressure  high cholesterol or triglycerides  kidney disease  liver disease  migraine headaches  seizures  stroke  tobacco smoker  an unusual or allergic reaction to etonogestrel, anesthetics or antiseptics, other medicines, foods, dyes, or preservatives  pregnant or trying to get pregnant  breast-feeding How should I use this medicine? This device is inserted just under the skin on the inner side of your upper arm by a health care professional. Talk to your pediatrician regarding the use of this medicine in children. Special care may be needed. Overdosage: If you think you have taken too much of this medicine contact a poison control center or emergency room at once. NOTE: This medicine is only for you. Do not share this medicine with others. What if I miss a dose? This does not apply. What may interact with this medicine? Do not take this medicine with any of the following medications:  amprenavir  fosamprenavir This medicine may also interact with the following medications:  acitretin  aprepitant  armodafinil  bexarotene  bosentan  carbamazepine  certain medicines for fungal infections like fluconazole, ketoconazole, itraconazole and voriconazole  certain medicines to treat  hepatitis, HIV or AIDS  cyclosporine  felbamate  griseofulvin  lamotrigine  modafinil  oxcarbazepine  phenobarbital  phenytoin  primidone  rifabutin  rifampin  rifapentine  St. John's wort  topiramate This list may not describe all possible interactions. Give your health care provider a list of all the medicines, herbs, non-prescription drugs, or dietary supplements you use. Also tell them if you smoke, drink alcohol, or use illegal drugs. Some items may interact with your medicine. What should I watch for while using this medicine? This product does not protect you against HIV infection (AIDS) or other sexually transmitted diseases. You should be able to feel the implant by pressing your fingertips over the skin where it was inserted. Contact your doctor if you cannot feel the implant, and use a non-hormonal birth control method (such as condoms) until your doctor confirms that the implant is in place. Contact your doctor if you think that the implant may have broken or become bent while in your arm. You will receive a user card from your health care provider after the implant is inserted. The card is a record of the location of the implant in your upper arm and when it should be removed. Keep this card with your health records. What side effects may I notice from receiving this medicine? Side effects that you should report to your doctor or health care professional as soon as possible:  allergic reactions like skin rash, itching or hives, swelling of the face, lips, or tongue  breast lumps, breast tissue changes, or discharge  breathing problems  changes in emotions or moods  coughing up blood  if you feel that the implant   may have broken or bent while in your arm  high blood pressure  pain, irritation, swelling, or bruising at the insertion site  scar at site of insertion  signs of infection at the insertion site such as fever, and skin redness, pain or  discharge  signs and symptoms of a blood clot such as breathing problems; changes in vision; chest pain; severe, sudden headache; pain, swelling, warmth in the leg; trouble speaking; sudden numbness or weakness of the face, arm or leg  signs and symptoms of liver injury like dark yellow or brown urine; general ill feeling or flu-like symptoms; light-colored stools; loss of appetite; nausea; right upper belly pain; unusually weak or tired; yellowing of the eyes or skin  unusual vaginal bleeding, discharge Side effects that usually do not require medical attention (report to your doctor or health care professional if they continue or are bothersome):  acne  breast pain or tenderness  headache  irregular menstrual bleeding  nausea This list may not describe all possible side effects. Call your doctor for medical advice about side effects. You may report side effects to FDA at 1-800-FDA-1088. Where should I keep my medicine? This drug is given in a hospital or clinic and will not be stored at home. NOTE: This sheet is a summary. It may not cover all possible information. If you have questions about this medicine, talk to your doctor, pharmacist, or health care provider.  2020 Elsevier/Gold Standard (2019-03-01 11:33:04)   Threatened Miscarriage  A threatened miscarriage occurs when a woman has vaginal bleeding during the first 20 weeks of pregnancy but the pregnancy has not ended. If you have vaginal bleeding during this time, your health care provider will do tests to make sure you are still pregnant. If the tests show that you are still pregnant and that the developing baby (fetus) inside your uterus is still growing, your condition is considered a threatened miscarriage. A threatened miscarriage does not mean your pregnancy will end, but it does increase the risk of losing your pregnancy (complete miscarriage). What are the causes? The cause of this condition is usually not known. For  women who go on to have a complete miscarriage, the most common cause is an abnormal number of chromosomes in the developing baby. Chromosomes are the structures inside cells that hold all of a person's genetic material. What increases the risk? The following lifestyle factors may increase your risk of a miscarriage in early pregnancy:  Smoking.  Drinking excessive amounts of alcohol or caffeine.  Recreational drug use. The following preexisting health conditions may increase your risk of a miscarriage in early pregnancy:  Polycystic ovary syndrome.  Uterine fibroids.  Infections.  Diabetes mellitus. What are the signs or symptoms? Symptoms of this condition include:  Vaginal bleeding.  Mild abdominal pain or cramps. How is this diagnosed? If you have bleeding with or without abdominal pain before 20 weeks of pregnancy, your health care provider will do tests to check whether you are still pregnant. These will include:  Ultrasound. This test uses sound waves to create images of the inside of your uterus. This allows your health care provider to look at your developing baby and other structures, such as your placenta.  Pelvic exam. This is an internal exam of your vagina and cervix.  Measurement of your baby's heart rate.  Laboratory tests such as blood tests, urine tests, or swabs for infection You may be diagnosed with a threatened miscarriage if:  Ultrasound testing shows that you  are still pregnant.  Your baby's heart rate is strong.  A pelvic exam shows that the opening between your uterus and your vagina (cervix) is closed.  Blood tests confirm that you are still pregnant. How is this treated? No treatments have been shown to prevent a threatened miscarriage from going on to a complete miscarriage. However, the right home care is important. Follow these instructions at home:  Get plenty of rest.  Do not have sex or use tampons if you have vaginal bleeding.  Do  not douche.  Do not smoke or use recreational drugs.  Do not drink alcohol.  Avoid caffeine.  Keep all follow-up prenatal visits as told by your health care provider. This is important. Contact a health care provider if:  You have light vaginal bleeding or spotting while pregnant.  You have abdominal pain or cramping.  You have a fever. Get help right away if:  You have heavy vaginal bleeding.  You have blood clots coming from your vagina.  You pass tissue from your vagina.  You leak fluid, or you have a gush of fluid from your vagina.  You have severe low back pain or abdominal cramps.  You have fever, chills, and severe abdominal pain. Summary  A threatened miscarriage occurs when a woman has vaginal bleeding during the first 20 weeks of pregnancy but the pregnancy has not ended.  The cause of a threatened miscarriage is usually not known.  Symptoms of this condition may include vaginal bleeding and mild abdominal pain or cramps.  No treatments have been shown to prevent a threatened miscarriage from going on to a complete miscarriage.  Keep all follow-up prenatal visits as told by your health care provider. This is important. This information is not intended to replace advice given to you by your health care provider. Make sure you discuss any questions you have with your health care provider. Document Revised: 06/25/2017 Document Reviewed: 08/15/2016 Elsevier Patient Education  [place].   Vaginal Bleeding During Pregnancy, Second Trimester  A small amount of bleeding (spotting) from the vagina is common during pregnancy. Sometimes the bleeding is normal and is not a sign of problems. In some other cases, it is a sign of something serious. Tell your doctor right away if there is any bleeding from your vagina. Follow these instructions at home: Activity  Follow your doctor's instructions about how active you can be.  If needed, make plans for someone  to help with your normal activities.  Do not exercise or do activities that take a lot of effort until your doctor says that this is safe.  Do not lift anything that is heavier than 10 lb (4.5 kg) until your doctor says that this is safe.  Do not have sex or orgasms until your doctor says that this is safe. Medicines  Take over-the-counter and prescription medicines only as told by your doctor.  Do not take aspirin. It can cause bleeding. General instructions  Watch your condition for any changes.  Write down: ? The number of pads you use each day. ? How often you change pads. ? How soaked your pads are.  Do not use tampons.  Do not douche.  If you pass any tissue from your vagina, save it to show to your doctor.  Keep all follow-up visits as told by your doctor. This is important. Contact a doctor if:  You have bleeding in the vagina at any time during pregnancy.  You have cramps.  You  have a fever that does not get better with medicine. Get help right away if:  You have very bad cramps in your back or belly (abdomen).  You have contractions.  You have chills.  You pass large clots or a lot of tissue from your vagina.  Your bleeding gets worse.  You feel light-headed.  You feel weak.  You pass out (faint).  You are leaking fluid from your vagina.  You have a gush of fluid from your vagina. Summary  Sometimes vaginal bleeding during pregnancy is normal and is not a problem. Sometimes it may be a sign of something serious.  Tell your doctor about any bleeding from your vagina right away.  Follow your doctor's instructions about how active you can be. You may need someone to help you with your normal activities. This information is not intended to replace advice given to you by your health care provider. Make sure you discuss any questions you have with your health care provider. Document Revised: 09/07/2018 Document Reviewed: 08/20/2016 Elsevier Patient  Education  2020 [employer].

## 2019-08-16 LAB — BETA HCG QUANT (REF LAB): hCG Quant: 63121 m[IU]/mL

## 2019-08-16 NOTE — Progress Notes (Signed)
Subjective:   Yolanda Garcia is a 23 y.o. G2P0101 [redacted]w[redacted]d being seen today for work in problem obstetrical visit.    Patient reports vaginal bleeding that stopped two (2) days ago. Seen in ER on 08/11/2019.    She plans termination of pregnancy, but was unable to proceed due to vaginal bleeding. Advised by abortion clinic to determine viability prior to rescheduling appointment.   Denies contractions, vaginal bleeding or leaking of fluid.   The following portions of the patient's history were reviewed and updated as appropriate: allergies, current medications, past family history, past medical history, past social history, past surgical history and problem list.  Review of Systems:  ROS negative except as noted above. Information obtained from patient.    Objective:   BP (!) 94/48   Pulse 92   Wt 99 lb (44.9 kg)   LMP 05/27/2019 Comment: 2 periods in Dec  BMI 15.98 kg/m   FHT: Fetal Heart Rate (bpm): 156  Fetal Movement: Movement: Absent   Abdomen:  soft, gravid, appropriate for gestational age,non-tender    Assessment:   Pregnancy:  G2P0101 at [redacted]w[redacted]d  1. Threatened abortion  - US OB LESS THAN 14 WEEKS WITH OB TRANSVAGINAL; Future - Beta HCG, Quant  2. Second trimester bleeding  - US OB LESS THAN 14 WEEKS WITH OB TRANSVAGINAL; Future - Beta HCG, Quant  Plan:   Will obtain ultrasound to determine viability, see orders.   Labs today, see orders.   Encouraged patient to follow up for birth control if desired.   Reviewed red flag symptoms and when to call.    Gunnar Bulla, CNM [facility], [facility]

## 2019-08-24 ENCOUNTER — Ambulatory Visit (INDEPENDENT_AMBULATORY_CARE_PROVIDER_SITE_OTHER): Payer: Medicaid Other

## 2019-08-24 ENCOUNTER — Other Ambulatory Visit: Payer: Self-pay

## 2019-08-24 ENCOUNTER — Telehealth: Payer: Self-pay | Admitting: Certified Nurse Midwife

## 2019-08-24 DIAGNOSIS — O468X2 Other antepartum hemorrhage, second trimester: Secondary | ICD-10-CM | POA: Diagnosis not present

## 2019-08-24 DIAGNOSIS — Z3A16 16 weeks gestation of pregnancy: Secondary | ICD-10-CM | POA: Diagnosis not present

## 2019-08-24 DIAGNOSIS — O4692 Antepartum hemorrhage, unspecified, second trimester: Secondary | ICD-10-CM

## 2019-08-24 DIAGNOSIS — O200 Threatened abortion: Secondary | ICD-10-CM

## 2019-08-24 NOTE — Telephone Encounter (Signed)
Pt was checking out and the pt didn't have her next appt. I called back. I am sending this to you.  Pt gave telephone number on file. I told pt that a nurse would call with the next steps

## 2019-08-25 ENCOUNTER — Telehealth: Payer: Self-pay

## 2019-08-25 NOTE — Telephone Encounter (Signed)
Attempted to call patient- mailbox not setup. Mychart message sent instead.

## 2019-08-25 NOTE — Telephone Encounter (Signed)
1211 Telephone call from patient, verified full name and date of birth.   Ultrasound findings discussed with patient, verbalized understanding.   Schedule for termination tomorrow, still uncertain about proceeding.   Will call to start prenatal care if she decides to continue pregnancy.   Will call for contraception if pregnancy is terminated.   Reviewed red flag symptoms and when to call.    Gunnar Bulla, CNM [facility], [facility] 08/25/19 12:14 PM

## 2019-08-27 ENCOUNTER — Other Ambulatory Visit: Payer: Self-pay

## 2019-08-29 ENCOUNTER — Telehealth: Payer: Self-pay | Admitting: Certified Nurse Midwife

## 2019-08-29 MED ORDER — ONDANSETRON 4 MG PO TBDP: 4.0000 mg | ORAL_TABLET | Freq: Three times a day (TID) | ORAL | 0 refills | Status: DC | PRN

## 2019-08-29 NOTE — Telephone Encounter (Signed)
Prescription sent to pharmacy. LM for patient that prescription has been sent to pharmacy.

## 2019-08-29 NOTE — Telephone Encounter (Signed)
Pt called in and stated that she needs a refill on her meds ondansetron ZOFRAN ODT.  Sent to [facility] on [address]. Please advise

## 2019-08-29 NOTE — Telephone Encounter (Signed)
Please contact patient. Will provide refill, but needs to schedule new OB PE ASAP. Thanks, JML

## 2019-08-29 NOTE — Telephone Encounter (Signed)
Please refill. Thanks, JML

## 2019-08-30 ENCOUNTER — Other Ambulatory Visit: Payer: Medicaid Other

## 2019-09-07 ENCOUNTER — Telehealth: Payer: Self-pay | Admitting: Certified Nurse Midwife

## 2019-09-07 ENCOUNTER — Telehealth: Payer: Self-pay

## 2019-09-07 NOTE — Telephone Encounter (Signed)
mychart message sent to patient in regards to her message

## 2019-09-07 NOTE — Telephone Encounter (Signed)
Patient called this morning stating she has had some cramping towards her pelvic area. Reports occasionally spotting and felt a gush of fluid while using the bathroom. She would like to be seen or be advised on what to do please.

## 2019-09-08 ENCOUNTER — Telehealth: Payer: Self-pay

## 2019-09-08 NOTE — Telephone Encounter (Signed)
mychart message sent to patient

## 2019-09-09 ENCOUNTER — Ambulatory Visit (INDEPENDENT_AMBULATORY_CARE_PROVIDER_SITE_OTHER): Payer: Medicaid Other | Admitting: Certified Nurse Midwife

## 2019-09-09 ENCOUNTER — Other Ambulatory Visit: Payer: Self-pay

## 2019-09-09 ENCOUNTER — Encounter: Payer: Self-pay | Admitting: Certified Nurse Midwife

## 2019-09-09 ENCOUNTER — Other Ambulatory Visit (HOSPITAL_COMMUNITY)
Admission: RE | Admit: 2019-09-09 | Discharge: 2019-09-09 | Disposition: A | Discharge status: Home / Self Care | Payer: Medicaid Other | Source: Ambulatory Visit | Attending: Certified Nurse Midwife | Admitting: Certified Nurse Midwife

## 2019-09-09 VITALS — BP 90/49 | HR 77 | Wt 99.4 lb

## 2019-09-09 DIAGNOSIS — Z113 Encounter for screening for infections with a predominantly sexual mode of transmission: Secondary | ICD-10-CM

## 2019-09-09 DIAGNOSIS — O0930 Supervision of pregnancy with insufficient antenatal care, unspecified trimester: Secondary | ICD-10-CM

## 2019-09-09 DIAGNOSIS — O09892 Supervision of other high risk pregnancies, second trimester: Secondary | ICD-10-CM

## 2019-09-09 DIAGNOSIS — Z3689 Encounter for other specified antenatal screening: Secondary | ICD-10-CM

## 2019-09-09 DIAGNOSIS — Z3492 Encounter for supervision of normal pregnancy, unspecified, second trimester: Secondary | ICD-10-CM | POA: Insufficient documentation

## 2019-09-09 DIAGNOSIS — Z3A18 18 weeks gestation of pregnancy: Secondary | ICD-10-CM

## 2019-09-09 DIAGNOSIS — O4692 Antepartum hemorrhage, unspecified, second trimester: Secondary | ICD-10-CM

## 2019-09-09 DIAGNOSIS — Z124 Encounter for screening for malignant neoplasm of cervix: Secondary | ICD-10-CM | POA: Insufficient documentation

## 2019-09-09 NOTE — Patient Instructions (Signed)
Contraception Choices Contraception, also called birth control, means things to use or ways to try not to get pregnant. Hormonal birth control This kind of birth control uses hormones. Here are some types of hormonal birth control:  A tube that is put under skin of the arm (implant). The tube can stay in for as long as 3 years.  Shots to get every 3 months (injections).  Pills to take every day (birth control pills).  A patch to change 1 time each week for 3 weeks (birth control patch). After that, the patch is taken off for 1 week.  A ring to put in the vagina. The ring is left in for 3 weeks. Then it is taken out of the vagina for 1 week. Then a new ring is put in.  Pills to take after unprotected sex (emergency birth control pills). Barrier birth control Here are some types of barrier birth control:  A thin covering that is put on the penis before sex (female condom). The covering is thrown away after sex.  A soft, loose covering that is put in the vagina before sex (female condom). The covering is thrown away after sex.  A rubber bowl that sits over the cervix (diaphragm). The bowl must be made for you. The bowl is put into the vagina before sex. The bowl is left in for 6-8 hours after sex. It is taken out within 24 hours.  A small, soft cup that fits over the cervix (cervical cap). The cup must be made for you. The cup can be left in for 6-8 hours after sex. It is taken out within 48 hours.  A sponge that is put into the vagina before sex. It must be left in for at least 6 hours after sex. It must be taken out within 30 hours. Then it is thrown away.  A chemical that kills or stops sperm from getting into the uterus (spermicide). It may be a pill, cream, jelly, or foam to put in the vagina. The chemical should be used at least 10-15 minutes before sex. IUD (intrauterine) birth control An IUD is a small, T-shaped piece of plastic. It is put inside the uterus. There are two  kinds:  Hormone IUD. This kind can stay in for 3-5 years.  Copper IUD. This kind can stay in for 10 years. Permanent birth control Here are some types of permanent birth control:  Surgery to block the fallopian tubes.  Having an insert put into each fallopian tube.  Surgery to tie off the tubes that carry sperm (vasectomy). Natural planning birth control Here are some types of natural planning birth control:  Not having sex on the days the woman could get pregnant.  Using a calendar: ? To keep track of the length of each period. ? To find out what days pregnancy can happen. ? To plan to not have sex on days when pregnancy can happen.  Watching for symptoms of ovulation and not having sex during ovulation. One way the woman can check for ovulation is to check her temperature.  Waiting to have sex until after ovulation. Summary  Contraception, also called birth control, means things to use or ways to try not to get pregnant.  Hormonal methods of birth control include implants, injections, pills, patches, vaginal rings, and emergency birth control pills.  Barrier methods of birth control can include female condoms, female condoms, diaphragms, cervical caps, sponges, and spermicides.  There are two types of IUD (intrauterine device) birth control.  An IUD can be put in a woman's uterus to prevent pregnancy for 3-5 years.  Permanent sterilization can be done through a procedure for males, females, or both.  Natural planning methods involve not having sex on the days when the woman could get pregnant. This information is not intended to replace advice given to you by your health care provider. Make sure you discuss any questions you have with your health care provider. Document Revised: 09/08/2018 Document Reviewed: 05/29/2016 Elsevier Patient Education  [place].   Breastfeeding  Choosing to breastfeed is one of the best decisions you can make for yourself and your  baby. A change in hormones during pregnancy causes your breasts to make breast milk in your milk-producing glands. Hormones prevent breast milk from being released before your baby is born. They also prompt milk flow after birth. Once breastfeeding has begun, thoughts of your baby, as well as his or her sucking or crying, can stimulate the release of milk from your milk-producing glands. Benefits of breastfeeding Research shows that breastfeeding offers many health benefits for infants and mothers. It also offers a cost-free and convenient way to feed your baby. For your baby  Your first milk (colostrum) helps your baby's digestive system to function better.  Special cells in your milk (antibodies) help your baby to fight off infections.  Breastfed babies are less likely to develop asthma, allergies, obesity, or type 2 diabetes. They are also at lower risk for sudden infant death syndrome (SIDS).  Nutrients in breast milk are better able to meet your baby's needs compared to infant formula.  Breast milk improves your baby's brain development. For you  Breastfeeding helps to create a very special bond between you and your baby.  Breastfeeding is convenient. Breast milk costs nothing and is always available at the correct temperature.  Breastfeeding helps to burn calories. It helps you to lose the weight that you gained during pregnancy.  Breastfeeding makes your uterus return faster to its size before pregnancy. It also slows bleeding (lochia) after you give birth.  Breastfeeding helps to lower your risk of developing type 2 diabetes, osteoporosis, rheumatoid arthritis, cardiovascular disease, and breast, ovarian, uterine, and endometrial cancer later in life. Breastfeeding basics Starting breastfeeding  Find a comfortable place to sit or lie down, with your neck and back well-supported.  Place a pillow or a rolled-up blanket under your baby to bring him or her to the level of your  breast (if you are seated). Nursing pillows are specially designed to help support your arms and your baby while you breastfeed.  Make sure that your baby's tummy (abdomen) is facing your abdomen.  Gently massage your breast. With your fingertips, massage from the outer edges of your breast inward toward the nipple. This encourages milk flow. If your milk flows slowly, you may need to continue this action during the feeding.  Support your breast with 4 fingers underneath and your thumb above your nipple (make the letter "C" with your hand). Make sure your fingers are well away from your nipple and your baby's mouth.  Stroke your baby's lips gently with your finger or nipple.  When your baby's mouth is open wide enough, quickly bring your baby to your breast, placing your entire nipple and as much of the areola as possible into your baby's mouth. The areola is the colored area around your nipple. ? More areola should be visible above your baby's upper lip than below the lower lip. ? Your baby's  lips should be opened and extended outward (flanged) to ensure an adequate, comfortable latch. ? Your baby's tongue should be between his or her lower gum and your breast.  Make sure that your baby's mouth is correctly positioned around your nipple (latched). Your baby's lips should create a seal on your breast and be turned out (everted).  It is common for your baby to suck about 2-3 minutes in order to start the flow of breast milk. Latching Teaching your baby how to latch onto your breast properly is very important. An improper latch can cause nipple pain, decreased milk supply, and poor weight gain in your baby. Also, if your baby is not latched onto your nipple properly, he or she may swallow some air during feeding. This can make your baby fussy. Burping your baby when you switch breasts during the feeding can help to get rid of the air. However, teaching your baby to latch on properly is still the  best way to prevent fussiness from swallowing air while breastfeeding. Signs that your baby has successfully latched onto your nipple  Silent tugging or silent sucking, without causing you pain. Infant's lips should be extended outward (flanged).  Swallowing heard between every 3-4 sucks once your milk has started to flow (after your let-down milk reflex occurs).  Muscle movement above and in front of his or her ears while sucking. Signs that your baby has not successfully latched onto your nipple  Sucking sounds or smacking sounds from your baby while breastfeeding.  Nipple pain. If you think your baby has not latched on correctly, slip your finger into the corner of your baby's mouth to break the suction and place it between your baby's gums. Attempt to start breastfeeding again. Signs of successful breastfeeding Signs from your baby  Your baby will gradually decrease the number of sucks or will completely stop sucking.  Your baby will fall asleep.  Your baby's body will relax.  Your baby will retain a small amount of milk in his or her mouth.  Your baby will let go of your breast by himself or herself. Signs from you  Breasts that have increased in firmness, weight, and size 1-3 hours after feeding.  Breasts that are softer immediately after breastfeeding.  Increased milk volume, as well as a change in milk consistency and color by the fifth day of breastfeeding.  Nipples that are not sore, cracked, or bleeding. Signs that your baby is getting enough milk  Wetting at least 1-2 diapers during the first 24 hours after birth.  Wetting at least 5-6 diapers every 24 hours for the first week after birth. The urine should be clear or pale yellow by the age of 5 days.  Wetting 6-8 diapers every 24 hours as your baby continues to grow and develop.  At least 3 stools in a 24-hour period by the age of 5 days. The stool should be soft and yellow.  At least 3 stools in a 24-hour  period by the age of 7 days. The stool should be seedy and yellow.  No loss of weight greater than 10% of birth weight during the first 3 days of life.  Average weight gain of 4-7 oz (113-198 g) per week after the age of 4 days.  Consistent daily weight gain by the age of 5 days, without weight loss after the age of 2 weeks. After a feeding, your baby may spit up a small amount of milk. This is normal. Breastfeeding frequency and duration  Frequent feeding will help you make more milk and can prevent sore nipples and extremely full breasts (breast engorgement). Breastfeed when you feel the need to reduce the fullness of your breasts or when your baby shows signs of hunger. This is called "breastfeeding on demand." Signs that your baby is hungry include:  Increased alertness, activity, or restlessness.  Movement of the head from side to side.  Opening of the mouth when the corner of the mouth or cheek is stroked (rooting).  Increased sucking sounds, smacking lips, cooing, sighing, or squeaking.  Hand-to-mouth movements and sucking on fingers or hands.  Fussing or crying. Avoid introducing a pacifier to your baby in the first 4-6 weeks after your baby is born. After this time, you may choose to use a pacifier. Research has shown that pacifier use during the first year of a baby's life decreases the risk of sudden infant death syndrome (SIDS). Allow your baby to feed on each breast as long as he or she wants. When your baby unlatches or falls asleep while feeding from the first breast, offer the second breast. Because newborns are often sleepy in the first few weeks of life, you may need to awaken your baby to get him or her to feed. Breastfeeding times will vary from baby to baby. However, the following rules can serve as a guide to help you make sure that your baby is properly fed:  Newborns (babies 50 weeks of age or younger) may breastfeed every 1-3 hours.  Newborns should not go without  breastfeeding for longer than 3 hours during the day or 5 hours during the night.  You should breastfeed your baby a minimum of 8 times in a 24-hour period. Breast milk pumping     Pumping and storing breast milk allows you to make sure that your baby is exclusively fed your breast milk, even at times when you are unable to breastfeed. This is especially important if you go back to work while you are still breastfeeding, or if you are not able to be present during feedings. Your lactation consultant can help you find a method of pumping that works best for you and give you guidelines about how long it is safe to store breast milk. Caring for your breasts while you breastfeed Nipples can become dry, cracked, and sore while breastfeeding. The following recommendations can help keep your breasts moisturized and healthy:  Avoid using soap on your nipples.  Wear a supportive bra designed especially for nursing. Avoid wearing underwire-style bras or extremely tight bras (sports bras).  Air-dry your nipples for 3-4 minutes after each feeding.  Use only cotton bra pads to absorb leaked breast milk. Leaking of breast milk between feedings is normal.  Use lanolin on your nipples after breastfeeding. Lanolin helps to maintain your skin's normal moisture barrier. Pure lanolin is not harmful (not toxic) to your baby. You may also hand express a few drops of breast milk and gently massage that milk into your nipples and allow the milk to air-dry. In the first few weeks after giving birth, some women experience breast engorgement. Engorgement can make your breasts feel heavy, warm, and tender to the touch. Engorgement peaks within 3-5 days after you give birth. The following recommendations can help to ease engorgement:  Completely empty your breasts while breastfeeding or pumping. You may want to start by applying warm, moist heat (in the shower or with warm, water-soaked hand towels) just before feeding or  pumping. This increases circulation and  helps the milk flow. If your baby does not completely empty your breasts while breastfeeding, pump any extra milk after he or she is finished.  Apply ice packs to your breasts immediately after breastfeeding or pumping, unless this is too uncomfortable for you. To do this: ? Put ice in a plastic bag. ? Place a towel between your skin and the bag. ? Leave the ice on for 20 minutes, 2-3 times a day.  Make sure that your baby is latched on and positioned properly while breastfeeding. If engorgement persists after 48 hours of following these recommendations, contact your health care provider or a Science writer. Overall health care recommendations while breastfeeding  Eat 3 healthy meals and 3 snacks every day. Well-nourished mothers who are breastfeeding need an additional 450-500 calories a day. You can meet this requirement by increasing the amount of a balanced diet that you eat.  Drink enough water to keep your urine pale yellow or clear.  Rest often, relax, and continue to take your prenatal vitamins to prevent fatigue, stress, and low vitamin and mineral levels in your body (nutrient deficiencies).  Do not use any products that contain nicotine or tobacco, such as cigarettes and e-cigarettes. Your baby may be harmed by chemicals from cigarettes that pass into breast milk and exposure to secondhand smoke. If you need help quitting, ask your health care provider.  Avoid alcohol.  Do not use illegal drugs or marijuana.  Talk with your health care provider before taking any medicines. These include over-the-counter and prescription medicines as well as vitamins and herbal supplements. Some medicines that may be harmful to your baby can pass through breast milk.  It is possible to become pregnant while breastfeeding. If birth control is desired, ask your health care provider about options that will be safe while breastfeeding your baby. Where to  find more information: [employer]: www.llli.org Contact a health care provider if:  You feel like you want to stop breastfeeding or have become frustrated with breastfeeding.  Your nipples are cracked or bleeding.  Your breasts are red, tender, or warm.  You have: ? Painful breasts or nipples. ? A swollen area on either breast. ? A fever or chills. ? Nausea or vomiting. ? Drainage other than breast milk from your nipples.  Your breasts do not become full before feedings by the fifth day after you give birth.  You feel sad and depressed.  Your baby is: ? Too sleepy to eat well. ? Having trouble sleeping. ? More than 14 week old and wetting fewer than 6 diapers in a 24-hour period. ? Not gaining weight by 17 days of age.  Your baby has fewer than 3 stools in a 24-hour period.  Your baby's skin or the white parts of his or her eyes become yellow. Get help right away if:  Your baby is overly tired (lethargic) and does not want to wake up and feed.  Your baby develops an unexplained fever. Summary  Breastfeeding offers many health benefits for infant and mothers.  Try to breastfeed your infant when he or she shows early signs of hunger.  Gently tickle or stroke your baby's lips with your finger or nipple to allow the baby to open his or her mouth. Bring the baby to your breast. Make sure that much of the areola is in your baby's mouth. Offer one side and burp the baby before you offer the other side.  Talk with your health care provider or lactation  consultant if you have questions or you face problems as you breastfeed. This information is not intended to replace advice given to you by your health care provider. Make sure you discuss any questions you have with your health care provider. Document Revised: 08/13/2017 Document Reviewed: 06/20/2016 Elsevier Patient Education  [place].   WHAT OB PATIENTS CAN EXPECT   Confirmation of pregnancy  and ultrasound ordered if medically indicated-[redacted] weeks gestation  New OB (NOB) intake with nurse and New OB (NOB) labs- [redacted] weeks gestation  New OB (NOB) physical examination with provider- 11/[redacted] weeks gestation  Flu vaccine-[redacted] weeks gestation  Anatomy scan-[redacted] weeks gestation  Glucose tolerance test, blood work to test for anemia, T-dap vaccine-[redacted] weeks gestation  Vaginal swabs/cultures-STD/Group B strep-[redacted] weeks gestation  Appointments every 4 weeks until 28 weeks  Every 2 weeks from 28 weeks until 36 weeks  Weekly visits from 36 weeks until delivery    Healthy Weight Gain During Pregnancy, Adult A certain amount of weight gain during pregnancy is normal and healthy. How much weight you should gain depends on your overall health and a measurement called BMI (body mass index). BMI is an estimate of your body fat based on your height and weight. You can use an Freight forwarder to figure out your BMI, or you can ask your health care provider to calculate it for you at your next visit. Your recommended pregnancy weight gain is based on your pre-pregnancy BMI. General guidelines for a healthy total weight gain during pregnancy are listed below. If your BMI at or before the start of your pregnancy is:  Less than 18.5 (underweight), you should gain 28-40 lb (13-18 kg).  18.5-24.9 (normal weight), you should gain 25-35 lb (11-16 kg).  25-29.9 (overweight), you should gain 15-25 lb (7-11 kg).  30 or higher (obese), you should gain 11-20 lb (5-9 kg). These ranges vary depending on your individual health. If you are carrying more than one baby (multiples), it may be safe to gain more weight than these recommendations. If you gain less weight than recommended, that may be safe as long as your baby is growing and developing normally. How can unhealthy weight gain affect me and my baby? Gaining too much weight during pregnancy can lead to pregnancy complications, such as:  A temporary form of  diabetes that develops during pregnancy (gestational diabetes).  High blood pressure during pregnancy and protein in your urine (preeclampsia).  High blood pressure during pregnancy without protein in your urine (gestational hypertension).  Your baby having a high weight at birth, which may: ? Raise your risk of having a more difficult delivery or a surgical delivery (cesarean delivery, or C-section). ? Raise your child's risk of developing obesity during childhood. Not gaining enough weight can be life-threatening for your baby, and it may raise your baby's chances of:  Being born early (preterm).  Growing more slowly than normal during pregnancy (growth restriction).  Having a low weight at birth. What actions can I take to gain a healthy amount of weight during pregnancy? General instructions  Keep track of your weight gain during pregnancy.  Take over-the-counter and prescription medicines only as told by your health care provider. Take all prenatal supplements as directed.  Keep all health care visits during pregnancy (prenatal visits). These visits are a good time to discuss your weight gain. Your health care provider will weigh you at each visit to make sure you are gaining a healthy amount of weight. Nutrition  Eat a balanced, nutrient-rich diet. Eat plenty of: ? Fruits and vegetables, such as berries and broccoli. ? Whole grains, such as millet, barley, whole-wheat breads and cereals, and oatmeal. ? Low-fat dairy products or non-dairy products such as almond milk or rice milk. ? Protein foods, such as lean meat, chicken, eggs, and legumes (such as peas, beans, soybeans, and lentils).  Avoid foods that are fried or have a lot of fat, salt (sodium), or sugar.  Drink enough fluid to keep your urine pale yellow.  Choose healthy snack and drink options when you are at work or on the go: ? Drink water. Avoid soda, sports drinks, and juices that have added sugar. ? Avoid  drinks with caffeine, such as coffee and energy drinks. ? Eat snacks that are high in protein, such as nuts, protein bars, and low-fat yogurt. ? Carry convenient snacks in your purse that do not need refrigeration, such as a pack of trail mix, an apple, or a granola bar.  If you need help improving your diet, work with a health care provider or a diet and nutrition specialist (dietitian). Activity   Exercise regularly, as told by your health care provider. ? If you were active before becoming pregnant, you may be able to continue your regular fitness activities. ? If you were not active before pregnancy, you may gradually build up to exercising for 30 or more minutes on most days of the week. This may include walking, swimming, or yoga.  Ask your health care provider what activities are safe for you. Talk with your health care provider about whether you may need to be excused from certain school or work activities. Where to find more information Learn more about managing your weight gain during pregnancy from:  American Pregnancy Association: www.americanpregnancy.org  [place] Department of Agriculture pregnancy weight gain calculator: [employer] Summary  Too much weight gain during pregnancy can lead to complications for you and your baby.  Find out your pre-pregnancy BMI to determine how much weight gain is healthy for you.  Eat nutritious foods and stay active.  Keep all of your prenatal visits as told by your health care provider. This information is not intended to replace advice given to you by your health care provider. Make sure you discuss any questions you have with your health care provider. Document Revised: 02/09/2019 Document Reviewed: 02/06/2017 Elsevier Patient Education  [place].   Exercise During Pregnancy Exercise is an important part of being healthy for people of all ages. Exercise improves the function of your heart and lungs and helps you  maintain strength, flexibility, and a healthy body weight. Exercise also boosts energy levels and elevates mood. Most women should exercise regularly during pregnancy. In rare cases, women with certain medical conditions or complications may be asked to limit or avoid exercise during pregnancy. How does this affect me? Along with maintaining general strength and flexibility, exercising during pregnancy can help:  Keep strength in muscles that are used during labor and childbirth.  Decrease low back pain.  Reduce symptoms of depression.  Control weight gain during pregnancy.  Reduce the risk of needing insulin if you develop diabetes during pregnancy.  Decrease the risk of cesarean delivery.  Speed up your recovery after giving birth. How does this affect my baby? Exercise can help you have a healthy pregnancy. Exercise does not cause premature birth. It will not cause your baby to weigh less at birth. What exercises can I do? Many exercises are safe for  you to do during pregnancy. Do a variety of exercises that safely increase your heart and breathing rates and help you build and maintain muscle strength. Do exercises exactly as told by your health care provider. You may do these exercises:  Walking or hiking.  Swimming.  Water aerobics.  Riding a stationary bike.  Strength training.  Modified yoga or Pilates. Tell your instructor that you are pregnant. Avoid overstretching, and avoid lying on your back for long periods of time.  Running or jogging. Only choose this type of exercise if you: ? Ran or jogged regularly before your pregnancy. ? Can run or jog and still talk in complete sentences. What exercises should I avoid? Depending on your level of fitness and whether you exercised regularly before your pregnancy, you may be told to limit high-intensity exercise. You can tell that you are exercising at a high intensity if you are breathing much harder and faster and cannot  hold a conversation while exercising. You must avoid:  Contact sports.  Activities that put you at risk for falling on or being hit in the belly, such as downhill skiing, water skiing, surfing, rock climbing, cycling, gymnastics, and horseback riding.  Scuba diving.  Skydiving.  Yoga or Pilates in a room that is heated to high temperatures.  Jogging or running, unless you ran or jogged regularly before your pregnancy. While jogging or running, you should always be able to talk in full sentences. Do not run or jog so fast that you are unable to have a conversation.  Do not exercise at more than 6,000 feet above sea level (high elevation) if you are not used to exercising at high elevation. How do I exercise in a safe way?   Avoid overheating. Do not exercise in very high temperatures.  Wear loose-fitting, breathable clothes.  Avoid dehydration. Drink enough water before, during, and after exercise to keep your urine pale yellow.  Avoid overstretching. Because of hormone changes during pregnancy, it is easy to overstretch muscles, tendons, and ligaments during pregnancy.  Start slowly and ask your health care provider to recommend the types of exercise that are safe for you.  Do not exercise to lose weight. Follow these instructions at home:  Exercise on most days or all days of the week. Try to exercise for 30 minutes a day, 5 days a week, unless your health care provider tells you not to.  If you actively exercised before your pregnancy and you are healthy, your health care provider may tell you to continue to do moderate to high-intensity exercise.  If you are just starting to exercise or did not exercise much before your pregnancy, your health care provider may tell you to do low to moderate-intensity exercise. Questions to ask your health care provider  Is exercise safe for me?  What are signs that I should stop exercising?  Does my health condition mean that I should not  exercise during pregnancy?  When should I avoid exercising during pregnancy? Stop exercising and contact a health care provider if: You have any unusual symptoms, such as:  Mild contractions of the uterus or cramps in the abdomen.  Dizziness that does not go away when you rest. Stop exercising and get help right away if: You have any unusual symptoms, such as:  Sudden, severe pain in your low back or your belly.  Mild contractions of the uterus or cramps in the abdomen that do not improve with rest and drinking fluids.  Chest pain.  Bleeding or fluid leaking from your vagina.  Shortness of breath. These symptoms may represent a serious problem that is an emergency. Do not wait to see if the symptoms will go away. Get medical help right away. Call your local emergency services (911 in the [place] Do not drive yourself to the hospital. Summary  Most women should exercise regularly throughout pregnancy. In rare cases, women with certain medical conditions or complications may be asked to limit or avoid exercise during pregnancy.  Do not exercise to lose weight during pregnancy.  Your health care provider will tell you what level of physical activity is right for you.  Stop exercising and contact a health care provider if you have mild contractions of the uterus or cramps in the abdomen. Get help right away if these contractions or cramps do not improve with rest and drinking fluids.  Stop exercising and get help right away if you have sudden, severe pain in your low back or belly, chest pain, shortness of breath, or bleeding or leaking of fluid from your vagina. This information is not intended to replace advice given to you by your health care provider. Make sure you discuss any questions you have with your health care provider. Document Revised: 09/09/2018 Document Reviewed: 06/23/2018 Elsevier Patient Education  [place].   Common Medications Safe in  Pregnancy  Acne:      Constipation:  Benzoyl Peroxide     Colace  Clindamycin      Dulcolax Suppository  Topica Erythromycin     Fibercon  Salicylic Acid      Metamucil         Miralax AVOID:        Senakot   Accutane    Cough:  Retin-A       Cough Drops  Tetracycline      Phenergan w/ Codeine if Rx  Minocycline      Robitussin (Plain & DM)  Antibiotics:     Crabs/Lice:  Ceclor       RID  Cephalosporins    AVOID:  E-Mycins      Kwell  Keflex  Macrobid/Macrodantin   Diarrhea:  Penicillin      Kao-Pectate  Zithromax      Imodium AD         PUSH FLUIDS AVOID:       Cipro     Fever:  Tetracycline      Tylenol (Regular or Extra  Minocycline       Strength)  Levaquin      Extra Strength-Do not          Exceed 8 tabs/24 hrs Caffeine:        '200mg'$ /day (equiv. To 1 cup of coffee or  approx. 3 12 oz sodas)         Gas: Cold/Hayfever:       Gas-X  Benadryl      Mylicon  Claritin       Phazyme  **Claritin-D        Chlor-Trimeton    Headaches:  Dimetapp      ASA-Free Excedrin  Drixoral-Non-Drowsy     Cold Compress  Mucinex (Guaifenasin)     Tylenol (Regular or Extra  Sudafed/Sudafed-12 Hour     Strength)  **Sudafed PE Pseudoephedrine   Tylenol Cold & Sinus     Vicks Vapor Rub  Zyrtec  **AVOID if Problems With Blood Pressure         Heartburn: Avoid lying down for at least 1  hour after meals  Aciphex      Maalox     Rash:  Milk of Magnesia     Benadryl    Mylanta       1% Hydrocortisone Cream  Pepcid  Pepcid Complete   Sleep Aids:  Prevacid      Ambien   Prilosec       Benadryl  Rolaids       Chamomile Tea  Tums (Limit 4/day)     Unisom  Zantac       Tylenol PM         Warm milk-add vanilla or  Hemorrhoids:       Sugar for taste  Anusol/Anusol H.C.  (RX: Analapram 2.5%)  Sugar Substitutes:  Hydrocortisone OTC     Ok in moderation  Preparation H      Tucks        Vaseline lotion applied to tissue with  wiping    Herpes:     Throat:  Acyclovir      Oragel  Famvir  Valtrex     Vaccines:         Flu Shot Leg Cramps:       *Gardasil  Benadryl      Hepatitis A         Hepatitis B Nasal Spray:       Pneumovax  Saline Nasal Spray     Polio Booster         Tetanus Nausea:       Tuberculosis test or PPD  Vitamin B6 25 mg TID   AVOID:    Dramamine      *Gardasil  Emetrol       Live Poliovirus  Ginger Root 250 mg QID    MMR (measles, mumps &  High Complex Carbs @ Bedtime    rebella)  Sea Bands-Accupressure    Varicella (Chickenpox)  Unisom 1/2 tab TID     *No known complications           If received before Pain:         Known pregnancy;   Darvocet       Resume series after  Lortab        Delivery  Percocet    Yeast:   Tramadol      Femstat  Tylenol 3      Gyne-lotrimin  Ultram       Monistat  Vicodin           MISC:         All Sunscreens           Hair Coloring/highlights          Insect Repellant's          (Including DEET)         Mystic Tans   Second Trimester of Pregnancy  The second trimester is from week 14 through week 27 (month 4 through 6). This is often the time in pregnancy that you feel your best. Often times, morning sickness has lessened or quit. You may have more energy, and you may get hungry more often. Your unborn baby is growing rapidly. At the end of the sixth month, he or she is about 9 inches long and weighs about 1 pounds. You will likely feel the baby move between 18 and 20 weeks of pregnancy. Follow these instructions at home: Medicines  Take over-the-counter and prescription medicines only as told by your doctor. Some medicines are safe and some medicines  are not safe during pregnancy.  Take a prenatal vitamin that contains at least 600 micrograms (mcg) of folic acid.  If you have trouble pooping (constipation), take medicine that will make your stool soft (stool softener) if your doctor approves. Eating and drinking   Eat regular, healthy  meals.  Avoid raw meat and uncooked cheese.  If you get low calcium from the food you eat, talk to your doctor about taking a daily calcium supplement.  Avoid foods that are high in fat and sugars, such as fried and sweet foods.  If you feel sick to your stomach (nauseous) or throw up (vomit): ? Eat 4 or 5 small meals a day instead of 3 large meals. ? Try eating a few soda crackers. ? Drink liquids between meals instead of during meals.  To prevent constipation: ? Eat foods that are high in fiber, like fresh fruits and vegetables, whole grains, and beans. ? Drink enough fluids to keep your pee (urine) clear or pale yellow. Activity  Exercise only as told by your doctor. Stop exercising if you start to have cramps.  Do not exercise if it is too hot, too humid, or if you are in a place of great height (high altitude).  Avoid heavy lifting.  Wear low-heeled shoes. Sit and stand up straight.  You can continue to have sex unless your doctor tells you not to. Relieving pain and discomfort  Wear a good support bra if your breasts are tender.  Take warm water baths (sitz baths) to soothe pain or discomfort caused by hemorrhoids. Use hemorrhoid cream if your doctor approves.  Rest with your legs raised if you have leg cramps or low back pain.  If you develop puffy, bulging veins (varicose veins) in your legs: ? Wear support hose or compression stockings as told by your doctor. ? Raise (elevate) your feet for 15 minutes, 3-4 times a day. ? Limit salt in your food. Prenatal care  Write down your questions. Take them to your prenatal visits.  Keep all your prenatal visits as told by your doctor. This is important. Safety  Wear your seat belt when driving.  Make a list of emergency phone numbers, including numbers for family, friends, the hospital, and police and fire departments. General instructions  Ask your doctor about the right foods to eat or for help finding a counselor,  if you need these services.  Ask your doctor about local prenatal classes. Begin classes before month 6 of your pregnancy.  Do not use hot tubs, steam rooms, or saunas.  Do not douche or use tampons or scented sanitary pads.  Do not cross your legs for long periods of time.  Visit your dentist if you have not done so. Use a soft toothbrush to brush your teeth. Floss gently.  Avoid all smoking, herbs, and alcohol. Avoid drugs that are not approved by your doctor.  Do not use any products that contain nicotine or tobacco, such as cigarettes and e-cigarettes. If you need help quitting, ask your doctor.  Avoid cat litter boxes and soil used by cats. These carry germs that can cause birth defects in the baby and can cause a loss of your baby (miscarriage) or stillbirth. Contact a doctor if:  You have mild cramps or pressure in your lower belly.  You have pain when you pee (urinate).  You have bad smelling fluid coming from your vagina.  You continue to feel sick to your stomach (nauseous), throw up (vomit), or have  watery poop (diarrhea).  You have a nagging pain in your belly area.  You feel dizzy. Get help right away if:  You have a fever.  You are leaking fluid from your vagina.  You have spotting or bleeding from your vagina.  You have severe belly cramping or pain.  You lose or gain weight rapidly.  You have trouble catching your breath and have chest pain.  You notice sudden or extreme puffiness (swelling) of your face, hands, ankles, feet, or legs.  You have not felt the baby move in over an hour.  You have severe headaches that do not go away when you take medicine.  You have trouble seeing. Summary  The second trimester is from week 14 through week 27 (months 4 through 6). This is often the time in pregnancy that you feel your best.  To take care of yourself and your unborn baby, you will need to eat healthy meals, take medicines only if your doctor tells  you to do so, and do activities that are safe for you and your baby.  Call your doctor if you get sick or if you notice anything unusual about your pregnancy. Also, call your doctor if you need help with the right food to eat, or if you want to know what activities are safe for you. This information is not intended to replace advice given to you by your health care provider. Make sure you discuss any questions you have with your health care provider. Document Revised: 09/10/2018 Document Reviewed: 06/24/2016 Elsevier Patient Education  [place].

## 2019-09-09 NOTE — Progress Notes (Signed)
NEW OB HISTORY AND PHYSICAL  SUBJECTIVE:       Yolanda Garcia is a 24 y.o. [id number] female, Patient's last menstrual period was 05/27/2019., Estimated Date of Delivery: 02/09/20, [redacted]w[redacted]d, presents today for establishment of Prenatal Care.  Reports nausea and vomiting relieved with Zofran. No other concerns.   Prefers midwifery care. Desires genetic screening.   Denies difficulty breathing or respiratory distress, chest pain, abdominal pain, vaginal bleeding, dysuria, and leg pain or swelling.    Gynecologic History  Patient's last menstrual period was 05/27/2019.   Contraception: none   Last Pap: due.  Obstetric History  OB History  Gravida Para Term Preterm AB Living  2 1   1   1   SAB TAB Ectopic Multiple Live Births        0 1    # Outcome Date GA Lbr Len/2nd Weight Sex Delivery Anes PTL Lv  2 Current           1 Preterm 02/14/19 [redacted]w[redacted]d / 00:22 5 lb 11 oz (2.58 kg) M Vag-Spont None  LIV    Past Medical History:  Diagnosis Date  . Anemia   . Panic attack     Past Surgical History:  Procedure Laterality Date  . NO PAST SURGERIES      Current Outpatient Medications on File Prior to Visit  Medication Sig Dispense Refill  . ondansetron (ZOFRAN ODT) 4 MG disintegrating tablet Take 1 tablet (4 mg total) by mouth every 8 (eight) hours as needed. 30 tablet 0  . metroNIDAZOLE (FLAGYL) 500 MG tablet Take 500 mg by mouth 2 (two) times daily. Filled Rx but hasn't started taking meds     No current facility-administered medications on file prior to visit.    No Known Allergies  Social History   Socioeconomic History  . Marital status: Single    Spouse name: Not on file  . Number of children: Not on file  . Years of education: Not on file  . Highest education level: Not on file  Occupational History  . Not on file  Tobacco Use  . Smoking status: Never Smoker  . Smokeless tobacco: Never Used  Substance and Sexual Activity  . Alcohol use: No  . Drug use: Never  .  Sexual activity: Yes  Other Topics Concern  . Not on file  Social History Narrative  . Not on file   Social Determinants of Health   Financial Resource Strain:   . Difficulty of Paying Living Expenses:   Food Insecurity:   . Worried About Charity fundraiser in the Last Year:   . Arboriculturist in the Last Year:   Transportation Needs:   . Film/video editor (Medical):   Marland Kitchen Lack of Transportation (Non-Medical):   Physical Activity:   . Days of Exercise per Week:   . Minutes of Exercise per Session:   Stress:   . Feeling of Stress :   Social Connections:   . Frequency of Communication with Friends and Family:   . Frequency of Social Gatherings with Friends and Family:   . Attends Religious Services:   . Active Member of Clubs or Organizations:   . Attends Archivist Meetings:   Marland Kitchen Marital Status:   Intimate Partner Violence:   . Fear of Current or Ex-Partner:   . Emotionally Abused:   Marland Kitchen Physically Abused:   . Sexually Abused:     Family History  Problem Relation Age of Onset  . Healthy Mother  The following portions of the patient's history were reviewed and updated as appropriate: allergies, current medications, past OB history, past medical history, past surgical history, past family history, past social history, and problem list.  Review of Systems:  ROS negative except as noted above. Information obtained from patient.   OBJECTIVE:  BP (!) 90/49   Pulse 77   Wt 99 lb 7 oz (45.1 kg)   LMP 05/27/2019 Comment: 2 periods in Dec  BMI 16.05 kg/m   Initial Physical Exam (New OB)  GENERAL APPEARANCE: alert, well appearing, in no apparent distress  HEAD: normocephalic, atraumatic  MOUTH: mucous membranes moist, pharynx normal without lesions  THYROID: not examined  BREASTS: patient declined exam  LUNGS: clear to auscultation, no wheezes, rales or rhonchi, symmetric air entry  HEART: regular rate and rhythm, no murmurs  ABDOMEN: soft,  nontender, nondistended, no abnormal masses, no epigastric pain and FHT present  EXTREMITIES: no redness or tenderness in the calves or thighs, no edema  SKIN: normal coloration and turgor, no rashes  LYMPH NODES: no adenopathy palpable  NEUROLOGIC: alert, oriented, normal speech, no focal findings or movement disorder noted  PELVIC EXAM: Pap collected  ASSESSMENT: Normal pregnancy Late prenatal care-PMH and labs completed Second trimester bleeding Short interval pregnancy Desires genetic screening-Panorama ordered  PLAN: Prenatal care New OB counseling: The patient has been given an overview regarding routine prenatal care. Recommendations regarding diet, weight gain, and exercise in pregnancy were given. Prenatal testing, optional genetic testing, and ultrasound use in pregnancy were reviewed.  Benefits of Breast Feeding were discussed. The patient is encouraged to consider nursing her baby post partum. See orders   Gunnar Bulla, CNM [facility], [facility] 09/09/19 11:08 AM

## 2019-09-10 ENCOUNTER — Encounter: Payer: Self-pay | Admitting: Certified Nurse Midwife

## 2019-09-10 DIAGNOSIS — O09899 Supervision of other high risk pregnancies, unspecified trimester: Secondary | ICD-10-CM | POA: Insufficient documentation

## 2019-09-10 DIAGNOSIS — Z2839 Other underimmunization status: Secondary | ICD-10-CM | POA: Insufficient documentation

## 2019-09-10 LAB — HIV ANTIBODY (ROUTINE TESTING W REFLEX): HIV Screen 4th Generation wRfx: NONREACTIVE

## 2019-09-10 LAB — VARICELLA ZOSTER ANTIBODY, IGG: Varicella zoster IgG: <135 index — ABNORMAL LOW (ref >165)

## 2019-09-10 LAB — HEPATITIS B SURFACE ANTIGEN: Hepatitis B Surface Ag: NEGATIVE

## 2019-09-10 LAB — RPR: RPR Ser Ql: NONREACTIVE

## 2019-09-10 LAB — ANTIBODY SCREEN: Antibody Screen: NEGATIVE

## 2019-09-10 LAB — RUBELLA SCREEN: Rubella Antibodies, IGG: 1.73 index (ref >0.99)

## 2019-09-11 LAB — URINE CULTURE: Organism ID, Bacteria: NO GROWTH

## 2019-09-12 ENCOUNTER — Telehealth: Payer: Self-pay | Admitting: Certified Nurse Midwife

## 2019-09-12 LAB — MONITOR DRUG PROFILE 14(MW)
Amphetamine Scrn, Ur: NEGATIVE ng/mL
BARBITURATE SCREEN URINE: NEGATIVE ng/mL
BENZODIAZEPINE SCREEN, URINE: NEGATIVE ng/mL
Buprenorphine, Urine: NEGATIVE ng/mL
CANNABINOIDS UR QL SCN: NEGATIVE ng/mL
Cocaine (Metab) Scrn, Ur: NEGATIVE ng/mL
Creatinine(Crt), U: 143.1 mg/dL (ref 20.0–300.0)
Fentanyl, Urine: NEGATIVE pg/mL
Meperidine Screen, Urine: NEGATIVE ng/mL
Methadone Screen, Urine: NEGATIVE ng/mL
OXYCODONE+OXYMORPHONE UR QL SCN: NEGATIVE ng/mL
Opiate Scrn, Ur: NEGATIVE ng/mL
Ph of Urine: 5.7 (ref 4.5–8.9)
Phencyclidine Qn, Ur: NEGATIVE ng/mL
Propoxyphene Scrn, Ur: NEGATIVE ng/mL
SPECIFIC GRAVITY: 1.018
Tramadol Screen, Urine: NEGATIVE ng/mL

## 2019-09-12 NOTE — Telephone Encounter (Signed)
Advised patient, CNM has not seen results yet. Will contact mother when arrive. Thanks, JML

## 2019-09-12 NOTE — Telephone Encounter (Signed)
Patient called in stating she received an e-mail from the genetic testing company and that she would like her mother to receive the results. Patient asked if we could call her mother at [phone] and provide her with the gender, if we had received the results.

## 2019-09-13 LAB — URINALYSIS, ROUTINE W REFLEX MICROSCOPIC

## 2019-09-13 NOTE — Telephone Encounter (Signed)
Pt called in and wanted to go over results. I advise the pt that they haven't been reviewed and once they are someone will call her. Please advise

## 2019-09-15 ENCOUNTER — Telehealth: Payer: Self-pay | Admitting: Certified Nurse Midwife

## 2019-09-15 NOTE — Telephone Encounter (Signed)
Still awaiting results. May take two (2) to three (3) weeks. Thanks, JML

## 2019-09-15 NOTE — Telephone Encounter (Signed)
Pt was requested her gender in and envelope and her mom will come pick up. Please advise when its done .

## 2019-09-15 NOTE — Telephone Encounter (Signed)
Called pt to let know know that it could take 2 to 3 weeks. The pt understood

## 2019-09-19 ENCOUNTER — Telehealth: Payer: Self-pay

## 2019-09-19 NOTE — Telephone Encounter (Signed)
Spoke with pt and informed pt that I could not get in touch with her mother. Pt asked if I would let her bestfriend know the genetic results. Pt's friend is aware.

## 2019-09-22 ENCOUNTER — Encounter: Payer: Medicaid Other | Admitting: Certified Nurse Midwife

## 2019-09-22 ENCOUNTER — Ambulatory Visit (INDEPENDENT_AMBULATORY_CARE_PROVIDER_SITE_OTHER): Payer: Medicaid Other

## 2019-09-22 ENCOUNTER — Other Ambulatory Visit: Payer: Self-pay

## 2019-09-22 ENCOUNTER — Ambulatory Visit (INDEPENDENT_AMBULATORY_CARE_PROVIDER_SITE_OTHER): Payer: Medicaid Other | Admitting: Certified Nurse Midwife

## 2019-09-22 VITALS — BP 82/53 | HR 78 | Wt 106.0 lb

## 2019-09-22 DIAGNOSIS — O0930 Supervision of pregnancy with insufficient antenatal care, unspecified trimester: Secondary | ICD-10-CM

## 2019-09-22 DIAGNOSIS — Z3492 Encounter for supervision of normal pregnancy, unspecified, second trimester: Secondary | ICD-10-CM

## 2019-09-22 DIAGNOSIS — Z3A20 20 weeks gestation of pregnancy: Secondary | ICD-10-CM | POA: Diagnosis not present

## 2019-09-22 LAB — POCT URINALYSIS DIPSTICK OB
Bilirubin, UA: NEGATIVE
Blood, UA: NEGATIVE
Glucose, UA: NEGATIVE
Ketones, UA: NEGATIVE
Leukocytes, UA: NEGATIVE
Nitrite, UA: NEGATIVE
POC,PROTEIN,UA: NEGATIVE
Spec Grav, UA: 1.01 (ref 1.010–1.025)
Urobilinogen, UA: 0.2 E.U./dL
pH, UA: 5 (ref 5.0–8.0)

## 2019-09-22 NOTE — Progress Notes (Signed)
ROB-Doing well, no questions or concerns. Anatomy scan today completed and normal, see below. Anticipatory guidance regarding course of prenatal care. Reviewed red flag symptoms and when to call. RTC x 4 weeks for ROB or sooner if needed.   Date of Service: 09/22/2019   Indications:Anatomy Ultrasound Findings:  Singleton intrauterine pregnancy is visualized with FHR at 150 BPM. Biometrics give an (U/S) Gestational age of [redacted]w[redacted]d and an (U/S) EDD of 02/11/2020; this correlates with the clinically established Estimated Date of Delivery: 02/09/20  Fetal presentation is Variable.  EFW: 389 g ( 11 oz). Fetal Percentile  Placenta: anterior. Grade: 1 AFI: subjectively normal.  Anatomic survey is complete and normal; Gender - surprise.    Right Ovary is normal in appearance. Left Ovary is normal appearance. Survey of the adnexa demonstrates no adnexal masses. There is no free peritoneal fluid in the cul de sac.  Impression: 1. [redacted]w[redacted]d Viable Singleton Intrauterine pregnancy by U/S. 2. (U/S) EDD is consistent with Clinically established Estimated Date of Delivery: 02/09/20 . 3. Normal Anatomy Scan  Recommendations: 1.Clinical correlation with the patient's History and Physical Exam.   Glorious Peach, [facility], [employer] [facility] [employer]  Gunnar Bulla, [place], [facility], [place]

## 2019-09-22 NOTE — Patient Instructions (Signed)

## 2019-09-23 ENCOUNTER — Encounter: Payer: Medicaid Other | Admitting: Certified Nurse Midwife

## 2019-10-07 ENCOUNTER — Ambulatory Visit (INDEPENDENT_AMBULATORY_CARE_PROVIDER_SITE_OTHER): Payer: Medicaid Other | Admitting: Certified Nurse Midwife

## 2019-10-07 ENCOUNTER — Other Ambulatory Visit: Payer: Self-pay

## 2019-10-07 VITALS — BP 105/54 | HR 91 | Wt 105.3 lb

## 2019-10-07 DIAGNOSIS — Z3492 Encounter for supervision of normal pregnancy, unspecified, second trimester: Secondary | ICD-10-CM

## 2019-10-07 DIAGNOSIS — M549 Dorsalgia, unspecified: Secondary | ICD-10-CM

## 2019-10-07 DIAGNOSIS — N760 Acute vaginitis: Secondary | ICD-10-CM

## 2019-10-07 DIAGNOSIS — B9689 Other specified bacterial agents as the cause of diseases classified elsewhere: Secondary | ICD-10-CM

## 2019-10-07 DIAGNOSIS — Z3A22 22 weeks gestation of pregnancy: Secondary | ICD-10-CM

## 2019-10-07 DIAGNOSIS — O99891 Other specified diseases and conditions complicating pregnancy: Secondary | ICD-10-CM

## 2019-10-07 LAB — POCT URINALYSIS DIPSTICK OB
Bilirubin, UA: NEGATIVE
Blood, UA: NEGATIVE
Glucose, UA: NEGATIVE
Ketones, UA: NEGATIVE
Leukocytes, UA: NEGATIVE
Nitrite, UA: NEGATIVE
POC,PROTEIN,UA: NEGATIVE
Spec Grav, UA: 1.015 (ref 1.010–1.025)
Urobilinogen, UA: 0.2 E.U./dL
pH, UA: 5 (ref 5.0–8.0)

## 2019-10-07 MED ORDER — METRONIDAZOLE 0.75 % VA GEL: 1.0000 | Freq: Every day | VAGINAL | 1 refills | Status: DC

## 2019-10-07 NOTE — Patient Instructions (Signed)
Bacterial Vaginosis  Bacterial vaginosis is an infection of the vagina. It happens when too many normal germs (healthy bacteria) grow in the vagina. This infection puts you at risk for infections from sex (STIs). Treating this infection can lower your risk for some STIs. You should also treat this if you are pregnant. It can cause your baby to be born early. Follow these instructions at home: Medicines  Take over-the-counter and prescription medicines only as told by your doctor.  Take or use your antibiotic medicine as told by your doctor. Do not stop taking or using it even if you start to feel better. General instructions  If you your sexual partner is a woman, tell her that you have this infection. She needs to get treatment if she has symptoms. If you have a female partner, he does not need to be treated.  During treatment: ? Avoid sex. ? Do not douche. ? Avoid alcohol as told. ? Avoid breastfeeding as told.  Drink enough fluid to keep your pee (urine) clear or pale yellow.  Keep your vagina and butt (rectum) clean. ? Wash the area with warm water every day. ? Wipe from front to back after you use the toilet.  Keep all follow-up visits as told by your doctor. This is important. Preventing this condition  Do not douche.  Use only warm water to wash around your vagina.  Use protection when you have sex. This includes: ? Latex condoms. ? Dental dams.  Limit how many people you have sex with. It is best to only have sex with the same person (be monogamous).  Get tested for STIs. Have your partner get tested.  Wear underwear that is cotton or lined with cotton.  Avoid tight pants and pantyhose. This is most important in summer.  Do not use any products that have nicotine or tobacco in them. These include cigarettes and e-cigarettes. If you need help quitting, ask your doctor.  Do not use illegal drugs.  Limit how much alcohol you drink. Contact a doctor if:  Your  symptoms do not get better, even after you are treated.  You have more discharge or pain when you pee (urinate).  You have a fever.  You have pain in your belly (abdomen).  You have pain with sex.  Your bleed from your vagina between periods. Summary  This infection happens when too many germs (bacteria) grow in the vagina.  Treating this condition can lower your risk for some infections from sex (STIs).  You should also treat this if you are pregnant. It can cause early (premature) birth.  Do not stop taking or using your antibiotic medicine even if you start to feel better. This information is not intended to replace advice given to you by your health care provider. Make sure you discuss any questions you have with your health care provider. Document Revised: 05/01/2017 Document Reviewed: 02/02/2016 Elsevier Patient Education  2020 Elsevier Inc.    Round Ligament Pain  The round ligament is a cord of muscle and tissue that helps support the uterus. It can become a source of pain during pregnancy if it becomes stretched or twisted as the baby grows. The pain usually begins in the second trimester (13-28 weeks) of pregnancy, and it can come and go until the baby is delivered. It is not a serious problem, and it does not cause harm to the baby. Round ligament pain is usually a short, sharp, and pinching pain, but it can also be a  dull, lingering, and aching pain. The pain is felt in the lower side of the abdomen or in the groin. It usually starts deep in the groin and moves up to the outside of the hip area. The pain may occur when you:  Suddenly change position, such as quickly going from a sitting to standing position.  Roll over in bed.  Cough or sneeze.  Do physical activity. Follow these instructions at home:   Watch your condition for any changes.  When the pain starts, relax. Then try any of these methods to help with the pain: ? Sitting down. ? Flexing your knees  up to your abdomen. ? Lying on your side with one pillow under your abdomen and another pillow between your legs. ? Sitting in a warm bath for 15-20 minutes or until the pain goes away.  Take over-the-counter and prescription medicines only as told by your health care provider.  Move slowly when you sit down or stand up.  Avoid long walks if they cause pain.  Stop or reduce your physical activities if they cause pain.  Keep all follow-up visits as told by your health care provider. This is important. Contact a health care provider if:  Your pain does not go away with treatment.  You feel pain in your back that you did not have before.  Your medicine is not helping. Get help right away if:  You have a fever or chills.  You develop uterine contractions.  You have vaginal bleeding.  You have nausea or vomiting.  You have diarrhea.  You have pain when you urinate. Summary  Round ligament pain is felt in the lower abdomen or groin. It is usually a short, sharp, and pinching pain. It can also be a dull, lingering, and aching pain.  This pain usually begins in the second trimester (13-28 weeks). It occurs because the uterus is stretching with the growing baby, and it is not harmful to the baby.  You may notice the pain when you suddenly change position, when you cough or sneeze, or during physical activity.  Relaxing, flexing your knees to your abdomen, lying on one side, or taking a warm bath may help to get rid of the pain.  Get help from your health care provider if the pain does not go away or if you have vaginal bleeding, nausea, vomiting, diarrhea, or painful urination. This information is not intended to replace advice given to you by your health care provider. Make sure you discuss any questions you have with your health care provider. Document Revised: 11/04/2017 Document Reviewed: 11/04/2017 Elsevier Patient Education  2020 Elsevier Inc.    Back Pain in  Pregnancy Back pain during pregnancy is common. Back pain may be caused by several factors that are related to changes during your pregnancy. Follow these instructions at home: Managing pain, stiffness, and swelling      If directed, for sudden (acute) back pain, put ice on the painful area. ? Put ice in a plastic bag. ? Place a towel between your skin and the bag. ? Leave the ice on for 20 minutes, 2-3 times per day.  If directed, apply heat to the affected area before you exercise. Use the heat source that your health care provider recommends, such as a moist heat pack or a heating pad. ? Place a towel between your skin and the heat source. ? Leave the heat on for 20-30 minutes. ? Remove the heat if your skin turns bright red. This  is especially important if you are unable to feel pain, heat, or cold. You may have a greater risk of getting burned.  If directed, massage the affected area. Activity  Exercise as told by your health care provider. Gentle exercise is the best way to prevent or manage back pain.  Listen to your body when lifting. If lifting hurts, ask for help or bend your knees. This uses your leg muscles instead of your back muscles.  Squat down when picking up something from the floor. Do not bend over.  Only use bed rest for short periods as told by your health care provider. Bed rest should only be used for the most severe episodes of back pain. Standing, sitting, and lying down  Do not stand in one place for long periods of time.  Use good posture when sitting. Make sure your head rests over your shoulders and is not hanging forward. Use a pillow on your lower back if necessary.  Try sleeping on your side, preferably the left side, with a pregnancy support pillow or 1-2 regular pillows between your legs. ? If you have back pain after a night's rest, your bed may be too soft. ? A firm mattress may provide more support for your back during pregnancy. General  instructions  Do not wear high heels.  Eat a healthy diet. Try to gain weight within your health care provider's recommendations.  Use a maternity girdle, elastic sling, or back brace as told by your health care provider.  Take over-the-counter and prescription medicines only as told by your health care provider.  Work with a physical therapist or massage therapist to find ways to manage back pain. Acupuncture or massage therapy may be helpful.  Keep all follow-up visits as told by your health care provider. This is important. Contact a health care provider if:  Your back pain interferes with your daily activities.  You have increasing pain in other parts of your body. Get help right away if:  You develop numbness, tingling, weakness, or problems with the use of your arms or legs.  You develop severe back pain that is not controlled with medicine.  You have a change in bowel or bladder control.  You develop shortness of breath, dizziness, or you faint.  You develop nausea, vomiting, or sweating.  You have back pain that is a rhythmic, cramping pain similar to labor pains. Labor pain is usually 1-2 minutes apart, lasts for about 1 minute, and involves a bearing down feeling or pressure in your pelvis.  You have back pain and your water breaks or you have vaginal bleeding.  You have back pain or numbness that travels down your leg.  Your back pain developed after you fell.  You develop pain on one side of your back.  You see blood in your urine.  You develop skin blisters in the area of your back pain. Summary  Back pain may be caused by several factors that are related to changes during your pregnancy.  Follow instructions as told by your health care provider for managing pain, stiffness, and swelling.  Exercise as told by your health care provider. Gentle exercise is the best way to prevent or manage back pain.  Take over-the-counter and prescription medicines only  as told by your health care provider.  Keep all follow-up visits as told by your health care provider. This is important. This information is not intended to replace advice given to you by your health care provider. Make sure  you discuss any questions you have with your health care provider. Document Revised: 09/07/2018 Document Reviewed: 11/04/2017 Elsevier Patient Education  2020 [employer].     Second Trimester of Pregnancy  The second trimester is from week 14 through week 27 (month 4 through 6). This is often the time in pregnancy that you feel your best. Often times, morning sickness has lessened or quit. You may have more energy, and you may get hungry more often. Your unborn baby is growing rapidly. At the end of the sixth month, he or she is about 9 inches long and weighs about 1 pounds. You will likely feel the baby move between 18 and 20 weeks of pregnancy. Follow these instructions at home: Medicines  Take over-the-counter and prescription medicines only as told by your doctor. Some medicines are safe and some medicines are not safe during pregnancy.  Take a prenatal vitamin that contains at least 600 micrograms (mcg) of folic acid.  If you have trouble pooping (constipation), take medicine that will make your stool soft (stool softener) if your doctor approves. Eating and drinking   Eat regular, healthy meals.  Avoid raw meat and uncooked cheese.  If you get low calcium from the food you eat, talk to your doctor about taking a daily calcium supplement.  Avoid foods that are high in fat and sugars, such as fried and sweet foods.  If you feel sick to your stomach (nauseous) or throw up (vomit): ? Eat 4 or 5 small meals a day instead of 3 large meals. ? Try eating a few soda crackers. ? Drink liquids between meals instead of during meals.  To prevent constipation: ? Eat foods that are high in fiber, like fresh fruits and vegetables, whole grains, and  beans. ? Drink enough fluids to keep your pee (urine) clear or pale yellow. Activity  Exercise only as told by your doctor. Stop exercising if you start to have cramps.  Do not exercise if it is too hot, too humid, or if you are in a place of great height (high altitude).  Avoid heavy lifting.  Wear low-heeled shoes. Sit and stand up straight.  You can continue to have sex unless your doctor tells you not to. Relieving pain and discomfort  Wear a good support bra if your breasts are tender.  Take warm water baths (sitz baths) to soothe pain or discomfort caused by hemorrhoids. Use hemorrhoid cream if your doctor approves.  Rest with your legs raised if you have leg cramps or low back pain.  If you develop puffy, bulging veins (varicose veins) in your legs: ? Wear support hose or compression stockings as told by your doctor. ? Raise (elevate) your feet for 15 minutes, 3-4 times a day. ? Limit salt in your food. Prenatal care  Write down your questions. Take them to your prenatal visits.  Keep all your prenatal visits as told by your doctor. This is important. Safety  Wear your seat belt when driving.  Make a list of emergency phone numbers, including numbers for family, friends, the hospital, and police and fire departments. General instructions  Ask your doctor about the right foods to eat or for help finding a counselor, if you need these services.  Ask your doctor about local prenatal classes. Begin classes before month 6 of your pregnancy.  Do not use hot tubs, steam rooms, or saunas.  Do not douche or use tampons or scented sanitary pads.  Do not cross your legs for long  periods of time.  Visit your dentist if you have not done so. Use a soft toothbrush to brush your teeth. Floss gently.  Avoid all smoking, herbs, and alcohol. Avoid drugs that are not approved by your doctor.  Do not use any products that contain nicotine or tobacco, such as cigarettes and  e-cigarettes. If you need help quitting, ask your doctor.  Avoid cat litter boxes and soil used by cats. These carry germs that can cause birth defects in the baby and can cause a loss of your baby (miscarriage) or stillbirth. Contact a doctor if:  You have mild cramps or pressure in your lower belly.  You have pain when you pee (urinate).  You have bad smelling fluid coming from your vagina.  You continue to feel sick to your stomach (nauseous), throw up (vomit), or have watery poop (diarrhea).  You have a nagging pain in your belly area.  You feel dizzy. Get help right away if:  You have a fever.  You are leaking fluid from your vagina.  You have spotting or bleeding from your vagina.  You have severe belly cramping or pain.  You lose or gain weight rapidly.  You have trouble catching your breath and have chest pain.  You notice sudden or extreme puffiness (swelling) of your face, hands, ankles, feet, or legs.  You have not felt the baby move in over an hour.  You have severe headaches that do not go away when you take medicine.  You have trouble seeing. Summary  The second trimester is from week 14 through week 27 (months 4 through 6). This is often the time in pregnancy that you feel your best.  To take care of yourself and your unborn baby, you will need to eat healthy meals, take medicines only if your doctor tells you to do so, and do activities that are safe for you and your baby.  Call your doctor if you get sick or if you notice anything unusual about your pregnancy. Also, call your doctor if you need help with the right food to eat, or if you want to know what activities are safe for you. This information is not intended to replace advice given to you by your health care provider. Make sure you discuss any questions you have with your health care provider. Document Revised: 09/10/2018 Document Reviewed: 06/24/2016 Elsevier Patient Education  [place].

## 2019-10-07 NOTE — Progress Notes (Signed)
Problem OB- Reports low back pain, had in prior pregnancy and requests referral to physical therapy. Provided handout for pregnancy support belts and referral placed to PT, see order. Rx Metrogel, sent to pharmacy of file. Provided medication insertion instructions.  Anticipatory guidance given and Reviewed red flags and when to call the clinic.   RTC x 4 weeks for ROB or sooner if needed.   Glorious Peach RN [facility] [employer] 10/07/19 5:15 PM

## 2019-10-07 NOTE — Progress Notes (Signed)
I have seen, interviewed, and examined the patient in conjunction with the [employer] Nurse Practitioner student and affirm the diagnosis and management plan.   Gunnar Bulla, CNM [facility], [facility] 10/07/19 5:35 PM

## 2019-10-20 ENCOUNTER — Encounter: Payer: Medicaid Other | Admitting: Certified Nurse Midwife

## 2019-10-27 ENCOUNTER — Observation Stay
Admission: EM | Admit: 2019-10-27 | Discharge: 2019-10-28 | Disposition: A | Discharge status: Home / Self Care | Payer: Medicaid Other | Attending: Obstetrics and Gynecology | Admitting: Obstetrics and Gynecology

## 2019-10-27 ENCOUNTER — Encounter: Payer: Self-pay | Admitting: Obstetrics and Gynecology

## 2019-10-27 DIAGNOSIS — O4692 Antepartum hemorrhage, unspecified, second trimester: Principal | ICD-10-CM | POA: Insufficient documentation

## 2019-10-27 DIAGNOSIS — Z3490 Encounter for supervision of normal pregnancy, unspecified, unspecified trimester: Secondary | ICD-10-CM

## 2019-10-27 DIAGNOSIS — R109 Unspecified abdominal pain: Secondary | ICD-10-CM | POA: Diagnosis not present

## 2019-10-27 DIAGNOSIS — Z3A25 25 weeks gestation of pregnancy: Secondary | ICD-10-CM | POA: Diagnosis not present

## 2019-10-27 DIAGNOSIS — O26899 Other specified pregnancy related conditions, unspecified trimester: Secondary | ICD-10-CM

## 2019-10-27 DIAGNOSIS — O26892 Other specified pregnancy related conditions, second trimester: Secondary | ICD-10-CM | POA: Insufficient documentation

## 2019-10-27 NOTE — Progress Notes (Signed)
Pt stated she lost her mucous plug and is having lots of pressure in pelvic area, small vaginal bleeding when she wipes. Also c/o of abdominal pain

## 2019-10-28 DIAGNOSIS — R109 Unspecified abdominal pain: Secondary | ICD-10-CM

## 2019-10-28 DIAGNOSIS — Z3A25 25 weeks gestation of pregnancy: Secondary | ICD-10-CM | POA: Diagnosis not present

## 2019-10-28 DIAGNOSIS — O4692 Antepartum hemorrhage, unspecified, second trimester: Secondary | ICD-10-CM | POA: Diagnosis not present

## 2019-10-28 DIAGNOSIS — Z3490 Encounter for supervision of normal pregnancy, unspecified, unspecified trimester: Secondary | ICD-10-CM

## 2019-10-28 DIAGNOSIS — R102 Pelvic and perineal pain: Secondary | ICD-10-CM | POA: Diagnosis not present

## 2019-10-28 DIAGNOSIS — O26899 Other specified pregnancy related conditions, unspecified trimester: Secondary | ICD-10-CM | POA: Diagnosis not present

## 2019-10-28 LAB — URINALYSIS, ROUTINE W REFLEX MICROSCOPIC
Bilirubin Urine: NEGATIVE
Glucose, UA: NEGATIVE mg/dL
Hgb urine dipstick: NEGATIVE
Ketones, ur: NEGATIVE mg/dL
Leukocytes,Ua: NEGATIVE
Nitrite: NEGATIVE
Protein, ur: NEGATIVE mg/dL
Specific Gravity, Urine: 1.015 (ref 1.005–1.030)
pH: 7 (ref 5.0–8.0)

## 2019-10-28 LAB — WET PREP, GENITAL
Clue Cells Wet Prep HPF POC: NONE SEEN
Sperm: NONE SEEN
Trich, Wet Prep: NONE SEEN
Yeast Wet Prep HPF POC: NONE SEEN

## 2019-10-28 NOTE — Discharge Summary (Signed)
Antenatal Discharge Summary  Patient ID: Milanie Rosenfield MRN: [id number] DOB/AGE: July 08, 1995 24 y.o.   Date of Admission: 10/27/2019  Date of Discharge: 10/28/2019  Admitting Diagnosis: Observation at [redacted]w[redacted]d  Secondary Diagnosis: Late entry prenatal care, History of second trimester bleeding, Nausea and vomiting in pregnancy, Pelvic pressure in pregnancy, Short interval between pregnancies, Varicella non-immune     Discharge Diagnosis: No other diagnosis   Antepartum Procedures: NST, Wet Prep and Tylenol    Brief Hospital Course   L&D OB Triage Note  Murray Guzzetta is a 24 y.o. [id number] female at [redacted]w[redacted]d, EDD Estimated Date of Delivery: 02/09/20 who presented to triage for complaints of abdominal pain, pelvic pressure and scant vaginal bleeding with wiping.  She was evaluated by the nurses with no significant findings for preterm labor or fetal distress. Vital signs stable. An NST was performed and has been reviewed by CNM. She was treated with Tylenol and oral hydration. A urinalysis and wet prep were completed.   NST INTERPRETATION:  Indications: rule out uterine contractions  Mode: External Baseline Rate (A): 120 bpm Variability: Moderate Accelerations: 15 x 15 Decelerations: None Contraction Frequency (min): occ ui  Impression: reactive   Plan: NST performed was reviewed and was found to be reactive. She was discharged home with bleeding/labor precautions.  Continue routine prenatal care. Follow up with CNM as previously scheduled.   Discharge Instructions: Per After Visit Summary.  Activity:  Also refer to After Visit Summary.  Diet: Regular  Medications: Allergies as of 10/28/2019   No Known Allergies     Medication List    ASK your doctor about these medications   metroNIDAZOLE 0.75 % vaginal gel Commonly known as: METROGEL Place 1 Applicatorful vaginally at bedtime. Apply one applicatorful to vagina at bedtime for 5 days   metroNIDAZOLE 500 MG  tablet Commonly known as: FLAGYL Take 500 mg by mouth 2 (two) times daily. Filled Rx but hasn't started taking meds   ondansetron 4 MG disintegrating tablet Commonly known as: Zofran ODT Take 1 tablet (4 mg total) by mouth every 8 (eight) hours as needed.       Discharged Condition: stable  Discharged to: home   Gunnar Bulla, CNM [facility], [facility] 10/28/19 10:33 AM

## 2019-10-28 NOTE — Discharge Instructions (Signed)

## 2019-10-28 NOTE — Progress Notes (Signed)
Pt came in c/o of abdominal pain, pelvic pressure, lost mucous plug and scant vaginal bleeding.  Pt on meds for BV. Order received for PO hydration, wet prep and UA.  Pt states she feels better after hydration, provider reviewed labs. Pt DC home in good condition accompanied by mother. Pt has appt on 6/4

## 2019-10-29 LAB — URINE CULTURE: Culture: 20000 — AB

## 2019-10-30 ENCOUNTER — Other Ambulatory Visit: Payer: Self-pay

## 2019-11-01 ENCOUNTER — Other Ambulatory Visit: Payer: Self-pay

## 2019-11-01 MED ORDER — ONDANSETRON 4 MG PO TBDP: 4.0000 mg | ORAL_TABLET | Freq: Three times a day (TID) | ORAL | 0 refills | Status: DC | PRN

## 2019-11-04 ENCOUNTER — Other Ambulatory Visit: Payer: Self-pay

## 2019-11-04 ENCOUNTER — Ambulatory Visit (INDEPENDENT_AMBULATORY_CARE_PROVIDER_SITE_OTHER): Payer: Medicaid Other | Admitting: Certified Nurse Midwife

## 2019-11-04 VITALS — BP 96/47 | HR 100 | Wt 107.1 lb

## 2019-11-04 DIAGNOSIS — Z3A26 26 weeks gestation of pregnancy: Secondary | ICD-10-CM

## 2019-11-04 DIAGNOSIS — Z3402 Encounter for supervision of normal first pregnancy, second trimester: Secondary | ICD-10-CM

## 2019-11-04 LAB — POCT URINALYSIS DIPSTICK OB
Bilirubin, UA: NEGATIVE
Blood, UA: NEGATIVE
Glucose, UA: NEGATIVE
Ketones, UA: NEGATIVE
Leukocytes, UA: NEGATIVE
Nitrite, UA: NEGATIVE
POC,PROTEIN,UA: NEGATIVE
Spec Grav, UA: 1.025 (ref 1.010–1.025)
Urobilinogen, UA: 0.2 E.U./dL
pH, UA: 5 (ref 5.0–8.0)

## 2019-11-04 NOTE — Progress Notes (Signed)
ROB- Doing well, seen in OB Triage and diagnosed with BV; completed Metrogel as prescribed. Plans to breastfeed for one (1) month then bottle feed. Discussed twenty-eight (28) week labs and discussed alternatives. Provided with illustrated birth choices and birth control after baby handout. Patient would like Nexplanon placed while in the hospital. Anticipatory guidance given. Reviewed red flags and when to call the office.  RTC x 2 weeks for ROB and Twenty-eight (28) week labs or sooner if needed.  Glorious Peach RN [facility] [employer] 11/04/19 5:16 PM

## 2019-11-04 NOTE — Progress Notes (Signed)
I have seen, interviewed, and examined the patient in conjunction with the [employer] Nurse Practitioner student and affirm the diagnosis and management plan.   Gunnar Bulla, CNM [facility], [facility] 11/04/19 6:01 PM

## 2019-11-04 NOTE — Patient Instructions (Signed)

## 2019-11-18 ENCOUNTER — Other Ambulatory Visit: Payer: Medicaid Other

## 2019-11-18 ENCOUNTER — Other Ambulatory Visit: Payer: Self-pay

## 2019-11-18 ENCOUNTER — Ambulatory Visit (INDEPENDENT_AMBULATORY_CARE_PROVIDER_SITE_OTHER): Payer: Medicaid Other | Admitting: Certified Nurse Midwife

## 2019-11-18 VITALS — BP 98/63 | HR 90 | Wt 110.2 lb

## 2019-11-18 DIAGNOSIS — Z113 Encounter for screening for infections with a predominantly sexual mode of transmission: Secondary | ICD-10-CM

## 2019-11-18 DIAGNOSIS — Z23 Encounter for immunization: Secondary | ICD-10-CM

## 2019-11-18 DIAGNOSIS — Z3A28 28 weeks gestation of pregnancy: Secondary | ICD-10-CM

## 2019-11-18 DIAGNOSIS — Z130 Encounter for screening for diseases of the blood and blood-forming organs and certain disorders involving the immune mechanism: Secondary | ICD-10-CM

## 2019-11-18 DIAGNOSIS — O2613 Low weight gain in pregnancy, third trimester: Secondary | ICD-10-CM

## 2019-11-18 DIAGNOSIS — O219 Vomiting of pregnancy, unspecified: Secondary | ICD-10-CM | POA: Diagnosis not present

## 2019-11-18 DIAGNOSIS — Z3483 Encounter for supervision of other normal pregnancy, third trimester: Secondary | ICD-10-CM

## 2019-11-18 LAB — POCT URINALYSIS DIPSTICK OB
Bilirubin, UA: NEGATIVE
Blood, UA: NEGATIVE
Glucose, UA: NEGATIVE
Ketones, UA: NEGATIVE
Leukocytes, UA: NEGATIVE
Nitrite, UA: NEGATIVE
POC,PROTEIN,UA: NEGATIVE
Spec Grav, UA: 1.025 (ref 1.010–1.025)
Urobilinogen, UA: 0.2 E.U./dL
pH, UA: 5 (ref 5.0–8.0)

## 2019-11-18 MED ORDER — TETANUS-DIPHTH-ACELL PERTUSSIS 5-2.5-18.5 LF-MCG/0.5 IM SUSP
0.5000 mL | Freq: Once | INTRAMUSCULAR | Status: AC
  Administered 2019-11-18: 0.5 mL via INTRAMUSCULAR

## 2019-11-18 NOTE — Progress Notes (Signed)
ROB-Doing well, except craving cornstarch daily. Advise likely anemic, iron samples provided. 28 week labs today, see orders. Blood transfusion consent discussed and signed. TDaP given, see chart. Third trimester handouts provided. Education regarding weight gain recommendations in pregnancy, [facility] referral completed. Anticipatory guidance regarding course of prenatal care. Reviewed red flag symptoms and when to call. RTC x 2 weeks for ROB and RTC x 4 weeks for growth ultrasound and ROB or sooner if needed.

## 2019-11-18 NOTE — Patient Instructions (Signed)
Third Trimester of Pregnancy  The third trimester is from week 28 through week 40 (months 7 through 9). This trimester is when your unborn baby (fetus) is growing very fast. At the end of the ninth month, the unborn baby is about 20 inches in length. It weighs about 6-10 pounds. Follow these instructions at home: Medicines  Take over-the-counter and prescription medicines only as told by your doctor. Some medicines are safe and some medicines are not safe during pregnancy.  Take a prenatal vitamin that contains at least 600 micrograms (mcg) of folic acid.  If you have trouble pooping (constipation), take medicine that will make your stool soft (stool softener) if your doctor approves. Eating and drinking   Eat regular, healthy meals.  Avoid raw meat and uncooked cheese.  If you get low calcium from the food you eat, talk to your doctor about taking a daily calcium supplement.  Eat four or five small meals rather than three large meals a day.  Avoid foods that are high in fat and sugars, such as fried and sweet foods.  To prevent constipation: ? Eat foods that are high in fiber, like fresh fruits and vegetables, whole grains, and beans. ? Drink enough fluids to keep your pee (urine) clear or pale yellow. Activity  Exercise only as told by your doctor. Stop exercising if you start to have cramps.  Avoid heavy lifting, wear low heels, and sit up straight.  Do not exercise if it is too hot, too humid, or if you are in a place of great height (high altitude).  You may continue to have sex unless your doctor tells you not to. Relieving pain and discomfort  Wear a good support bra if your breasts are tender.  Take frequent breaks and rest with your legs raised if you have leg cramps or low back pain.  Take warm water baths (sitz baths) to soothe pain or discomfort caused by hemorrhoids. Use hemorrhoid cream if your doctor approves.  If you develop puffy, bulging veins (varicose  veins) in your legs: ? Wear support hose or compression stockings as told by your doctor. ? Raise (elevate) your feet for 15 minutes, 3-4 times a day. ? Limit salt in your food. Safety  Wear your seat belt when driving.  Make a list of emergency phone numbers, including numbers for family, friends, the hospital, and police and fire departments. Preparing for your baby's arrival To prepare for the arrival of your baby:  Take prenatal classes.  Practice driving to the hospital.  Visit the hospital and tour the maternity area.  Talk to your work about taking leave once the baby comes.  Pack your hospital bag.  Prepare the baby's room.  Go to your doctor visits.  Buy a rear-facing car seat. Learn how to install it in your car. General instructions  Do not use hot tubs, steam rooms, or saunas.  Do not use any products that contain nicotine or tobacco, such as cigarettes and e-cigarettes. If you need help quitting, ask your doctor.  Do not drink alcohol.  Do not douche or use tampons or scented sanitary pads.  Do not cross your legs for long periods of time.  Do not travel for long distances unless you must. Only do so if your doctor says it is okay.  Visit your dentist if you have not gone during your pregnancy. Use a soft toothbrush to brush your teeth. Be gentle when you floss.  Avoid cat litter boxes and soil   used by cats. These carry germs that can cause birth defects in the baby and can cause a loss of your baby (miscarriage) or stillbirth.  Keep all your prenatal visits as told by your doctor. This is important. Contact a doctor if:  You are not sure if you are in labor or if your water has broken.  You are dizzy.  You have mild cramps or pressure in your lower belly.  You have a nagging pain in your belly area.  You continue to feel sick to your stomach, you throw up, or you have watery poop.  You have bad smelling fluid coming from your vagina.  You have  pain when you pee. Get help right away if:  You have a fever.  You are leaking fluid from your vagina.  You are spotting or bleeding from your vagina.  You have severe belly cramps or pain.  You lose or gain weight quickly.  You have trouble catching your breath and have chest pain.  You notice sudden or extreme puffiness (swelling) of your face, hands, ankles, feet, or legs.  You have not felt the baby move in over an hour.  You have severe headaches that do not go away with medicine.  You have trouble seeing.  You are leaking, or you are having a gush of fluid, from your vagina before you are 37 weeks.  You have regular belly spasms (contractions) before you are 37 weeks. Summary  The third trimester is from week 28 through week 40 (months 7 through 9). This time is when your unborn baby is growing very fast.  Follow your doctor's advice about medicine, food, and activity.  Get ready for the arrival of your baby by taking prenatal classes, getting all the baby items ready, preparing the baby's room, and visiting your doctor to be checked.  Get help right away if you are bleeding from your vagina, or you have chest pain and trouble catching your breath, or if you have not felt your baby move in over an hour. This information is not intended to replace advice given to you by your health care provider. Make sure you discuss any questions you have with your health care provider. Document Revised: 09/09/2018 Document Reviewed: 06/24/2016 Elsevier Patient Education  2020 Elsevier Inc[employer]   Fetal Movement Counts Patient Name: ________________________________________________ Patient Due Date: ____________________ What is a fetal movement count?  A fetal movement count is the number of times that you feel your baby move during a certain amount of time. This may also be called a fetal kick count. A fetal movement count is recommended for every pregnant woman. You may be asked to  start counting fetal movements as early as week 28 of your pregnancy. Pay attention to when your baby is most active. You may notice your baby's sleep and wake cycles. You may also notice things that make your baby move more. You should do a fetal movement count:  When your baby is normally most active.  At the same time each day. A good time to count movements is while you are resting, after having something to eat and drink. How do I count fetal movements? 1. Find a quiet, comfortable area. Sit, or lie down on your side. 2. Write down the date, the start time and stop time, and the number of movements that you felt between those two times. Take this information with you to your health care visits. 3. Write down your start time when you feel   the first movement. 4. Count kicks, flutters, swishes, rolls, and jabs. You should feel at least 10 movements. 5. You may stop counting after you have felt 10 movements, or if you have been counting for 2 hours. Write down the stop time. 6. If you do not feel 10 movements in 2 hours, contact your health care provider for further instructions. Your health care provider may want to do additional tests to assess your baby's well-being. Contact a health care provider if:  You feel fewer than 10 movements in 2 hours.  Your baby is not moving like he or she usually does. Date: ____________ Start time: ____________ Stop time: ____________ Movements: ____________ Date: ____________ Start time: ____________ Stop time: ____________ Movements: ____________ Date: ____________ Start time: ____________ Stop time: ____________ Movements: ____________ Date: ____________ Start time: ____________ Stop time: ____________ Movements: ____________ Date: ____________ Start time: ____________ Stop time: ____________ Movements: ____________ Date: ____________ Start time: ____________ Stop time: ____________ Movements: ____________ Date: ____________ Start time: ____________  Stop time: ____________ Movements: ____________ Date: ____________ Start time: ____________ Stop time: ____________ Movements: ____________ Date: ____________ Start time: ____________ Stop time: ____________ Movements: ____________ This information is not intended to replace advice given to you by your health care provider. Make sure you discuss any questions you have with your health care provider. Document Revised: 01/06/2019 Document Reviewed: 01/06/2019 Elsevier Patient Education  2020 Elsevier Inc.  

## 2019-11-19 ENCOUNTER — Encounter: Payer: Self-pay | Admitting: Certified Nurse Midwife

## 2019-11-19 ENCOUNTER — Other Ambulatory Visit (INDEPENDENT_AMBULATORY_CARE_PROVIDER_SITE_OTHER): Payer: Medicaid Other | Admitting: Certified Nurse Midwife

## 2019-11-19 DIAGNOSIS — O99013 Anemia complicating pregnancy, third trimester: Secondary | ICD-10-CM

## 2019-11-19 DIAGNOSIS — F5089 Other specified eating disorder: Secondary | ICD-10-CM | POA: Insufficient documentation

## 2019-11-19 DIAGNOSIS — Z3483 Encounter for supervision of other normal pregnancy, third trimester: Secondary | ICD-10-CM

## 2019-11-19 LAB — GLUCOSE, 1 HOUR GESTATIONAL: Gestational Diabetes Screen: 62 mg/dL — ABNORMAL LOW (ref 65–139)

## 2019-11-19 LAB — CBC
Hematocrit: 24.3 % — ABNORMAL LOW (ref 34.0–46.6)
Hemoglobin: 7.9 g/dL — ABNORMAL LOW (ref 11.1–15.9)
MCH: 25.2 pg — ABNORMAL LOW (ref 26.6–33.0)
MCHC: 32.5 g/dL (ref 31.5–35.7)
MCV: 77 fL — ABNORMAL LOW (ref 79–97)
Platelets: 201 10*3/uL (ref 150–450)
RBC: 3.14 x10E6/uL — ABNORMAL LOW (ref 3.77–5.28)
RDW: 13.4 % (ref 11.7–15.4)
WBC: 4.3 10*3/uL (ref 3.4–10.8)

## 2019-11-19 LAB — RPR: RPR Ser Ql: NONREACTIVE

## 2019-11-19 NOTE — Progress Notes (Signed)
Referral to Hem/Onc, see chart.    Serafina Royals, CNM [facility], [facility] 11/19/19 1:13 PM

## 2019-11-24 ENCOUNTER — Other Ambulatory Visit: Payer: Self-pay

## 2019-11-24 ENCOUNTER — Inpatient Hospital Stay: Payer: Medicaid Other | Attending: Oncology | Admitting: Oncology

## 2019-11-24 ENCOUNTER — Encounter: Payer: Self-pay | Admitting: Oncology

## 2019-11-24 ENCOUNTER — Inpatient Hospital Stay: Payer: Medicaid Other

## 2019-11-24 VITALS — BP 85/58 | HR 95 | Temp 97.8°F | Resp 20 | Wt 108.1 lb

## 2019-11-24 DIAGNOSIS — Z3A30 30 weeks gestation of pregnancy: Secondary | ICD-10-CM | POA: Diagnosis present

## 2019-11-24 DIAGNOSIS — O99013 Anemia complicating pregnancy, third trimester: Secondary | ICD-10-CM | POA: Diagnosis present

## 2019-11-24 DIAGNOSIS — D573 Sickle-cell trait: Secondary | ICD-10-CM | POA: Insufficient documentation

## 2019-11-24 DIAGNOSIS — D509 Iron deficiency anemia, unspecified: Secondary | ICD-10-CM | POA: Diagnosis not present

## 2019-11-24 LAB — CBC WITH DIFFERENTIAL/PLATELET
Abs Immature Granulocytes: 0.05 10*3/uL (ref 0.00–0.07)
Basophils Absolute: 0 10*3/uL (ref 0.0–0.1)
Basophils Relative: 0 %
Eosinophils Absolute: 0.1 10*3/uL (ref 0.0–0.5)
Eosinophils Relative: 2 %
HCT: 26.2 % — ABNORMAL LOW (ref 36.0–46.0)
Hemoglobin: 8.5 g/dL — ABNORMAL LOW (ref 12.0–15.0)
Immature Granulocytes: 1 %
Lymphocytes Relative: 20 %
Lymphs Abs: 1 10*3/uL (ref 0.7–4.0)
MCH: 24.4 pg — ABNORMAL LOW (ref 26.0–34.0)
MCHC: 32.4 g/dL (ref 30.0–36.0)
MCV: 75.3 fL — ABNORMAL LOW (ref 80.0–100.0)
Monocytes Absolute: 0.2 10*3/uL (ref 0.1–1.0)
Monocytes Relative: 4 %
Neutro Abs: 3.4 10*3/uL (ref 1.7–7.7)
Neutrophils Relative %: 73 %
Platelets: 197 10*3/uL (ref 150–400)
RBC: 3.48 MIL/uL — ABNORMAL LOW (ref 3.87–5.11)
RDW: 13.6 % (ref 11.5–15.5)
WBC: 4.7 10*3/uL (ref 4.0–10.5)
nRBC: 0 % (ref 0.0–0.2)

## 2019-11-24 LAB — COMPREHENSIVE METABOLIC PANEL
ALT: 9 U/L (ref 0–44)
AST: 16 U/L (ref 15–41)
Albumin: 3.6 g/dL (ref 3.5–5.0)
Alkaline Phosphatase: 65 U/L (ref 38–126)
Anion gap: 10 (ref 5–15)
BUN: 6 mg/dL (ref 6–20)
CO2: 24 mmol/L (ref 22–32)
Calcium: 8.7 mg/dL — ABNORMAL LOW (ref 8.9–10.3)
Chloride: 104 mmol/L (ref 98–111)
Creatinine, Ser: 0.52 mg/dL (ref 0.44–1.00)
GFR calc Af Amer: >60 mL/min (ref >60)
GFR calc non Af Amer: >60 mL/min (ref >60)
Glucose, Bld: 101 mg/dL — ABNORMAL HIGH (ref 70–99)
Potassium: 3.6 mmol/L (ref 3.5–5.1)
Sodium: 138 mmol/L (ref 135–145)
Total Bilirubin: 1.2 mg/dL (ref 0.3–1.2)
Total Protein: 7.1 g/dL (ref 6.5–8.1)

## 2019-11-24 LAB — TSH: TSH: 1.337 u[IU]/mL (ref 0.350–4.500)

## 2019-11-24 LAB — VITAMIN B12: Vitamin B-12: 315 pg/mL (ref 180–914)

## 2019-11-24 LAB — RETICULOCYTES
Immature Retic Fract: 18.1 % — ABNORMAL HIGH (ref 2.3–15.9)
RBC.: 3.47 MIL/uL — ABNORMAL LOW (ref 3.87–5.11)
Retic Count, Absolute: 55.9 10*3/uL (ref 19.0–186.0)
Retic Ct Pct: 1.6 % (ref 0.4–3.1)

## 2019-11-24 LAB — FOLATE: Folate: 9.7 ng/mL (ref >5.9)

## 2019-11-24 LAB — IRON AND TIBC
Iron: 29 ug/dL (ref 28–170)
Saturation Ratios: 6 % — ABNORMAL LOW (ref 10.4–31.8)
TIBC: 532 ug/dL — ABNORMAL HIGH (ref 250–450)
UIBC: 503 ug/dL

## 2019-11-24 LAB — FERRITIN: Ferritin: 5 ng/mL — ABNORMAL LOW (ref 11–307)

## 2019-11-24 NOTE — Progress Notes (Signed)
Hematology/Oncology Consult note [facility] [phone] [phone]  Patient Care Team: Gunnar Bulla, CNM as PCP - General (Certified Nurse Midwife)   Name of the patient: Yolanda Garcia  [id number]  11-Aug-1995    Reason for referral-anemia third trimester   Referring physician-Lawhorn Vanessa [place], CNM  Date of visit: 11/24/19   History of presenting illness- Patient is a 24 year old female G2, P1 L1 who is currently 30 weeks of gestation.  She has been referred to [place] for anemia.  Most recent CBC from 11/18/2019 showed white count of 4.3, H&H of 7.9/24.3 with an MCV of 77 and a platelet count of 201.  I do not see any recent anemia work-up that has been done.  Prior hemoglobin electrophoresis was consistent with sickle cell trait with an hemoglobin A of 56% and hemoglobin S of 40%  Patient reports she did not have any significant problems during her first pregnancy.  Currently her pregnancy is coming along well and other than mild fatigue he denies other complaints at this time  ECOG PS- 0  Pain scale- 0   Review of systems- Review of Systems  Constitutional: Positive for malaise/fatigue. Negative for chills, fever and weight loss.  HENT: Negative for congestion, ear discharge and nosebleeds.   Eyes: Negative for blurred vision.  Respiratory: Negative for cough, hemoptysis, sputum production, shortness of breath and wheezing.   Cardiovascular: Negative for chest pain, palpitations, orthopnea and claudication.  Gastrointestinal: Negative for abdominal pain, blood in stool, constipation, diarrhea, heartburn, melena, nausea and vomiting.  Genitourinary: Negative for dysuria, flank pain, frequency, hematuria and urgency.  Musculoskeletal: Negative for back pain, joint pain and myalgias.  Skin: Negative for rash.  Neurological: Negative for dizziness, tingling, focal weakness, seizures, weakness and headaches.    Endo/Heme/Allergies: Does not bruise/bleed easily.  Psychiatric/Behavioral: Negative for depression and suicidal ideas. The patient does not have insomnia.     No Known Allergies  Patient Active Problem List   Diagnosis Date Noted  . Pica 11/19/2019  . Low weight gain during pregnancy in third trimester 11/18/2019  . Pregnancy 10/28/2019  . Abdominal pain affecting pregnancy   . Maternal varicella, non-immune 09/10/2019  . Second trimester bleeding 09/09/2019  . Late prenatal care 09/09/2019  . Short interval between pregnancies affecting pregnancy in second trimester, antepartum 09/09/2019  . Nausea/vomiting in pregnancy 01/25/2019  . Pelvic pressure in pregnancy 01/25/2019  . Anemia in pregnancy 01/05/2019  . Sickle cell trait (HCC) 08/23/2018     Past Medical History:  Diagnosis Date  . Anemia   . Panic attack      Past Surgical History:  Procedure Laterality Date  . NO PAST SURGERIES      Social History   Socioeconomic History  . Marital status: Single    Spouse name: Not on file  . Number of children: Not on file  . Years of education: Not on file  . Highest education level: Not on file  Occupational History  . Not on file  Tobacco Use  . Smoking status: Never Smoker  . Smokeless tobacco: Never Used  Vaping Use  . Vaping Use: Never used  Substance and Sexual Activity  . Alcohol use: No  . Drug use: Never  . Sexual activity: Yes    Birth control/protection: None  Other Topics Concern  . Not on file  Social History Narrative  . Not on file   Social Determinants of Health   Financial Resource Strain:   . Difficulty  of Paying Living Expenses:   Food Insecurity:   . Worried About Programme researcher, broadcasting/film/video in the Last Year:   . Barista in the Last Year:   Transportation Needs:   . Freight forwarder (Medical):   Marland Kitchen Lack of Transportation (Non-Medical):   Physical Activity:   . Days of Exercise per Week:   . Minutes of Exercise per Session:    Stress:   . Feeling of Stress :   Social Connections:   . Frequency of Communication with Friends and Family:   . Frequency of Social Gatherings with Friends and Family:   . Attends Religious Services:   . Active Member of Clubs or Organizations:   . Attends Banker Meetings:   Marland Kitchen Marital Status:   Intimate Partner Violence:   . Fear of Current or Ex-Partner:   . Emotionally Abused:   Marland Kitchen Physically Abused:   . Sexually Abused:      Family History  Problem Relation Age of Onset  . Healthy Mother      Current Outpatient Medications:  .  ondansetron (ZOFRAN ODT) 4 MG disintegrating tablet, Take 1 tablet (4 mg total) by mouth every 8 (eight) hours as needed., Disp: 30 tablet, Rfl: 0   Physical exam: There were no vitals filed for this visit. Physical Exam Constitutional:      Comments: Patient is thin.  Appears in no acute distress  Cardiovascular:     Rate and Rhythm: Normal rate and regular rhythm.     Heart sounds: Normal heart sounds.  Pulmonary:     Effort: Pulmonary effort is normal.     Breath sounds: Normal breath sounds.  Abdominal:     Comments: Gravid uterus  Musculoskeletal:     Right lower leg: No edema.     Left lower leg: No edema.  Skin:    General: Skin is warm and dry.  Neurological:     Mental Status: She is alert and oriented to person, place, and time.        CMP Latest Ref Rng & Units 08/11/2019  Glucose 70 - 99 mg/dL 82  BUN 6 - 20 mg/dL 7  Creatinine 5.36 - 6.44 mg/dL 0.34(V)  Sodium 425 - 956 mmol/L 134(L)  Potassium 3.5 - 5.1 mmol/L 3.4(L)  Chloride 98 - 111 mmol/L 105  CO2 22 - 32 mmol/L 25  Calcium 8.9 - 10.3 mg/dL 9.1  Total Protein 6.5 - 8.1 g/dL -  Total Bilirubin 0.3 - 1.2 mg/dL -  Alkaline Phos 38 - 387 U/L -  AST 15 - 41 U/L -  ALT 0 - 44 U/L -   CBC Latest Ref Rng & Units 11/18/2019  WBC 3.4 - 10.8 x10E3/uL 4.3  Hemoglobin 11.1 - 15.9 g/dL 7.9(L)  Hematocrit 34.0 - 46.6 % 24.3(L)  Platelets 150 - 450  x10E3/uL 201     Assessment and plan- Patient is a 24 y.o. female referred for anemia in third trimester of pregnancy  Patient has significant Microcytic anemia patient's baseline hemoglobin outside of pregnancy is typically between 10-11 with no significant microcytosis likely secondary to her sickle cell trait.  I suspect patient has ongoing iron deficiency anemia due to pregnancy.  I will do an complete anemia work-up today including CBC ferritin iron studies, B12 and folate, tsh, reticulocyte count haptoglobin.  Once iron studies are back and labs are consistent with iron deficiency we will arrange for IV iron for her.  As per her insurance  she will require 5 doses of Venofer 200 mg which will be given over 2 weeks. she is in her third trimester of pregnancy and usually IV iron takes 6 to 8 weeks to assess response to infusion.  Discussed risks and benefits of IV iron including all but not limited to headache, nausea, leg swelling and possible risk of infusion reaction.  Patient understands and agrees to proceed as planned.  I will see her back in 6 weeks with repeat CBC ferritin iron studies  Thank you for this kind referral and the opportunity to participate in the care of this patient   Visit Diagnosis 1. Anemia affecting pregnancy in third trimester   2. Iron deficiency anemia, unspecified iron deficiency anemia type     Dr. Randa Evens, MD, MPH [facility] at [facility] [phone] 11/24/2019

## 2019-11-25 ENCOUNTER — Inpatient Hospital Stay: Payer: Medicaid Other

## 2019-11-25 ENCOUNTER — Other Ambulatory Visit: Payer: Self-pay

## 2019-11-25 VITALS — BP 96/58 | HR 100 | Temp 97.8°F | Resp 18

## 2019-11-25 DIAGNOSIS — O99013 Anemia complicating pregnancy, third trimester: Secondary | ICD-10-CM | POA: Diagnosis not present

## 2019-11-25 DIAGNOSIS — D509 Iron deficiency anemia, unspecified: Secondary | ICD-10-CM

## 2019-11-25 LAB — HAPTOGLOBIN: Haptoglobin: 50 mg/dL (ref 33–278)

## 2019-11-25 MED ORDER — SODIUM CHLORIDE 0.9 % IV SOLN
Freq: Once | INTRAVENOUS | Status: AC
  Filled 2019-11-25: qty 250

## 2019-11-25 MED ORDER — SODIUM CHLORIDE 0.9 % IV SOLN: 200.0000 mg | INTRAVENOUS | Status: DC

## 2019-11-25 MED ORDER — IRON SUCROSE 20 MG/ML IV SOLN
200.0000 mg | Freq: Once | INTRAVENOUS | Status: AC
  Administered 2019-11-25: 200 mg via INTRAVENOUS
  Filled 2019-11-25: qty 10

## 2019-11-27 ENCOUNTER — Encounter: Payer: Self-pay | Admitting: Oncology

## 2019-11-28 ENCOUNTER — Other Ambulatory Visit: Payer: Self-pay

## 2019-11-28 ENCOUNTER — Inpatient Hospital Stay: Payer: Medicaid Other

## 2019-11-28 VITALS — BP 91/53 | HR 93 | Temp 96.7°F | Resp 18

## 2019-11-28 DIAGNOSIS — O99013 Anemia complicating pregnancy, third trimester: Secondary | ICD-10-CM | POA: Diagnosis not present

## 2019-11-28 DIAGNOSIS — D509 Iron deficiency anemia, unspecified: Secondary | ICD-10-CM

## 2019-11-28 MED ORDER — SODIUM CHLORIDE 0.9 % IV SOLN: 200.0000 mg | INTRAVENOUS | Status: DC

## 2019-11-28 MED ORDER — SODIUM CHLORIDE 0.9 % IV SOLN
Freq: Once | INTRAVENOUS | Status: AC
  Filled 2019-11-28: qty 250

## 2019-11-28 MED ORDER — IRON SUCROSE 20 MG/ML IV SOLN
200.0000 mg | Freq: Once | INTRAVENOUS | Status: AC
  Administered 2019-11-28: 200 mg via INTRAVENOUS
  Filled 2019-11-28: qty 10

## 2019-11-28 NOTE — Progress Notes (Signed)
Pt tolerated infusion well. No s/s of distress or reaction noted. Pt and VS stable.

## 2019-11-29 MED ORDER — ONDANSETRON 4 MG PO TBDP: 4.0000 mg | ORAL_TABLET | Freq: Three times a day (TID) | ORAL | 0 refills | Status: DC | PRN

## 2019-12-02 ENCOUNTER — Encounter: Payer: Medicaid Other | Admitting: Certified Nurse Midwife

## 2019-12-02 ENCOUNTER — Inpatient Hospital Stay: Payer: Medicaid Other | Attending: Oncology

## 2019-12-06 ENCOUNTER — Encounter: Payer: Self-pay | Admitting: Certified Nurse Midwife

## 2019-12-06 ENCOUNTER — Other Ambulatory Visit: Payer: Self-pay

## 2019-12-06 ENCOUNTER — Ambulatory Visit (INDEPENDENT_AMBULATORY_CARE_PROVIDER_SITE_OTHER): Payer: Medicaid Other | Admitting: Certified Nurse Midwife

## 2019-12-06 VITALS — BP 94/50 | HR 86 | Wt 111.3 lb

## 2019-12-06 DIAGNOSIS — Z3A30 30 weeks gestation of pregnancy: Secondary | ICD-10-CM

## 2019-12-06 LAB — POCT URINALYSIS DIPSTICK OB
Bilirubin, UA: NEGATIVE
Blood, UA: NEGATIVE
Glucose, UA: NEGATIVE
Ketones, UA: NEGATIVE
Leukocytes, UA: NEGATIVE
Nitrite, UA: NEGATIVE
POC,PROTEIN,UA: NEGATIVE
Spec Grav, UA: 1.025 (ref 1.010–1.025)
Urobilinogen, UA: 0.2 E.U./dL
pH, UA: 5 (ref 5.0–8.0)

## 2019-12-06 NOTE — Patient Instructions (Signed)
[place] Pediatrician List  [facility]  [address], [place], [place] [address]  Phone: [phone]  [facility] (second location)  [address] [place], [place] [address]  Phone: [phone]  [facility] ([place]) [address], [place], [place] [address] Phone: [phone]  [facility]  [address] [place], [place] [address]  Phone: [phone] 

## 2019-12-06 NOTE — Progress Notes (Signed)
ROB doing well. Feels good movement. Has irregular contractions. She state she is pressure. Discussed PTL precautions. Discussed FFN swab. She declines at this time. Discussed completing GBS a little earlier due to hx PTB at 36 wks. U/s for growth next visit due to measuring 28-29 wks. She has appointment tomorrow for hematology. Follow up 2 wks with Marcelino Duster or PRN.   Doreene Burke, CNM

## 2019-12-07 ENCOUNTER — Inpatient Hospital Stay: Payer: Medicaid Other

## 2019-12-07 ENCOUNTER — Encounter: Payer: Self-pay | Admitting: Certified Nurse Midwife

## 2019-12-07 DIAGNOSIS — Z8751 Personal history of pre-term labor: Secondary | ICD-10-CM | POA: Insufficient documentation

## 2019-12-09 ENCOUNTER — Inpatient Hospital Stay: Payer: Medicaid Other

## 2019-12-11 ENCOUNTER — Other Ambulatory Visit: Payer: Self-pay

## 2019-12-11 ENCOUNTER — Encounter: Payer: Self-pay | Admitting: Obstetrics and Gynecology

## 2019-12-11 ENCOUNTER — Observation Stay
Admission: EM | Admit: 2019-12-11 | Discharge: 2019-12-11 | Disposition: A | Discharge status: Home / Self Care | Payer: Medicaid Other | Attending: Obstetrics and Gynecology | Admitting: Obstetrics and Gynecology

## 2019-12-11 DIAGNOSIS — R636 Underweight: Secondary | ICD-10-CM | POA: Diagnosis not present

## 2019-12-11 DIAGNOSIS — R102 Pelvic and perineal pain: Secondary | ICD-10-CM

## 2019-12-11 DIAGNOSIS — O99013 Anemia complicating pregnancy, third trimester: Secondary | ICD-10-CM | POA: Diagnosis not present

## 2019-12-11 DIAGNOSIS — Z79899 Other long term (current) drug therapy: Secondary | ICD-10-CM | POA: Diagnosis not present

## 2019-12-11 DIAGNOSIS — Z3A31 31 weeks gestation of pregnancy: Secondary | ICD-10-CM | POA: Diagnosis not present

## 2019-12-11 DIAGNOSIS — O26899 Other specified pregnancy related conditions, unspecified trimester: Secondary | ICD-10-CM | POA: Diagnosis not present

## 2019-12-11 DIAGNOSIS — O26893 Other specified pregnancy related conditions, third trimester: Principal | ICD-10-CM | POA: Insufficient documentation

## 2019-12-11 DIAGNOSIS — O212 Late vomiting of pregnancy: Secondary | ICD-10-CM | POA: Diagnosis not present

## 2019-12-11 DIAGNOSIS — D573 Sickle-cell trait: Secondary | ICD-10-CM | POA: Insufficient documentation

## 2019-12-11 DIAGNOSIS — R109 Unspecified abdominal pain: Secondary | ICD-10-CM

## 2019-12-11 DIAGNOSIS — O09899 Supervision of other high risk pregnancies, unspecified trimester: Secondary | ICD-10-CM

## 2019-12-11 DIAGNOSIS — Z2839 Other underimmunization status: Secondary | ICD-10-CM

## 2019-12-11 DIAGNOSIS — O09213 Supervision of pregnancy with history of pre-term labor, third trimester: Secondary | ICD-10-CM | POA: Insufficient documentation

## 2019-12-11 LAB — URINALYSIS, ROUTINE W REFLEX MICROSCOPIC
Bilirubin Urine: NEGATIVE
Glucose, UA: NEGATIVE mg/dL
Hgb urine dipstick: NEGATIVE
Ketones, ur: NEGATIVE mg/dL
Nitrite: NEGATIVE
Protein, ur: NEGATIVE mg/dL
Specific Gravity, Urine: 1.013 (ref 1.005–1.030)
pH: 6 (ref 5.0–8.0)

## 2019-12-11 LAB — CBC
HCT: 28.8 % — ABNORMAL LOW (ref 36.0–46.0)
Hemoglobin: 9.3 g/dL — ABNORMAL LOW (ref 12.0–15.0)
MCH: 25.2 pg — ABNORMAL LOW (ref 26.0–34.0)
MCHC: 32.3 g/dL (ref 30.0–36.0)
MCV: 78 fL — ABNORMAL LOW (ref 80.0–100.0)
Platelets: 170 10*3/uL (ref 150–400)
RBC: 3.69 MIL/uL — ABNORMAL LOW (ref 3.87–5.11)
RDW: 17.7 % — ABNORMAL HIGH (ref 11.5–15.5)
WBC: 4.6 10*3/uL (ref 4.0–10.5)
nRBC: 0 % (ref 0.0–0.2)

## 2019-12-11 LAB — WET PREP, GENITAL
Clue Cells Wet Prep HPF POC: NONE SEEN
Sperm: NONE SEEN
Trich, Wet Prep: NONE SEEN
Yeast Wet Prep HPF POC: NONE SEEN

## 2019-12-11 LAB — FETAL FIBRONECTIN: Fetal Fibronectin: NEGATIVE

## 2019-12-11 MED ORDER — ACETAMINOPHEN 500 MG PO TABS
1000.0000 mg | ORAL_TABLET | Freq: Once | ORAL | Status: AC
  Administered 2019-12-11: 1000 mg via ORAL
  Filled 2019-12-11: qty 2

## 2019-12-11 MED ORDER — HYDROXYZINE HCL 25 MG PO TABS
25.0000 mg | ORAL_TABLET | Freq: Once | ORAL | Status: AC
  Administered 2019-12-11: 25 mg via ORAL
  Filled 2019-12-11: qty 1

## 2019-12-11 MED ORDER — LACTATED RINGERS IV SOLN: INTRAVENOUS | Status: DC

## 2019-12-11 NOTE — Discharge Instructions (Signed)
PRETERM LABOR: Includes any of the following symptoms that occur between 20-[redacted] weeks gestation. If these symptoms are not stopped, preterm labor can result in preterm delivery, placing your baby at risk. ° °Notify your doctor if any of the following occur: °1. Menstrual-like cramps   5. Pelvic pressure  °2. Uterine contractions. These may be painless and feel like the uterus is tightening or the baby is "balling up" 6. Increase or change in vaginal discharge  °3. Low, dull backache, unrelieved by heat or Tylenol  7. Vaginal bleeding  °4. Intestinal cramps, with our without diarrhea, sometimes 8. A general feeling that "something is not right"  ° 9. Leaking of fluid described as "gas pain"  ° °

## 2019-12-12 LAB — RPR: RPR Ser Ql: NONREACTIVE

## 2019-12-15 ENCOUNTER — Ambulatory Visit (INDEPENDENT_AMBULATORY_CARE_PROVIDER_SITE_OTHER): Payer: Medicaid Other | Admitting: Certified Nurse Midwife

## 2019-12-15 ENCOUNTER — Ambulatory Visit (INDEPENDENT_AMBULATORY_CARE_PROVIDER_SITE_OTHER): Payer: Medicaid Other

## 2019-12-15 VITALS — BP 96/53 | HR 88 | Wt 110.1 lb

## 2019-12-15 DIAGNOSIS — O2613 Low weight gain in pregnancy, third trimester: Secondary | ICD-10-CM

## 2019-12-15 DIAGNOSIS — Z3483 Encounter for supervision of other normal pregnancy, third trimester: Secondary | ICD-10-CM

## 2019-12-15 DIAGNOSIS — Z3A30 30 weeks gestation of pregnancy: Secondary | ICD-10-CM

## 2019-12-15 DIAGNOSIS — Z3A34 34 weeks gestation of pregnancy: Secondary | ICD-10-CM

## 2019-12-15 DIAGNOSIS — O99013 Anemia complicating pregnancy, third trimester: Secondary | ICD-10-CM

## 2019-12-15 DIAGNOSIS — O09899 Supervision of other high risk pregnancies, unspecified trimester: Secondary | ICD-10-CM

## 2019-12-15 DIAGNOSIS — Z3A32 32 weeks gestation of pregnancy: Secondary | ICD-10-CM

## 2019-12-15 DIAGNOSIS — F5089 Other specified eating disorder: Secondary | ICD-10-CM

## 2019-12-15 DIAGNOSIS — R102 Pelvic and perineal pain: Secondary | ICD-10-CM

## 2019-12-15 DIAGNOSIS — Z3A28 28 weeks gestation of pregnancy: Secondary | ICD-10-CM

## 2019-12-15 DIAGNOSIS — Z3689 Encounter for other specified antenatal screening: Secondary | ICD-10-CM | POA: Diagnosis not present

## 2019-12-15 DIAGNOSIS — Z283 Underimmunization status: Secondary | ICD-10-CM

## 2019-12-15 DIAGNOSIS — O26899 Other specified pregnancy related conditions, unspecified trimester: Secondary | ICD-10-CM

## 2019-12-15 DIAGNOSIS — Z2839 Other underimmunization status: Secondary | ICD-10-CM

## 2019-12-15 LAB — POCT URINALYSIS DIPSTICK OB
Bilirubin, UA: NEGATIVE
Blood, UA: NEGATIVE
Glucose, UA: NEGATIVE
Ketones, UA: NEGATIVE
Leukocytes, UA: NEGATIVE
Nitrite, UA: NEGATIVE
POC,PROTEIN,UA: NEGATIVE
Spec Grav, UA: 1.015 (ref 1.010–1.025)
Urobilinogen, UA: 0.2 E.U./dL
pH, UA: 5 (ref 5.0–8.0)

## 2019-12-15 MED ORDER — HYDROXYZINE HCL 25 MG PO TABS
25.0000 mg | ORAL_TABLET | Freq: Four times a day (QID) | ORAL | 0 refills | Status: DC | PRN
Start: 2019-12-15 — End: 2020-01-31

## 2019-12-15 NOTE — Progress Notes (Signed)
ROB-Doing well since triage visit over the weekend. Rx Vistaril per patient request, see orders. Growth ultrasound within normal limits, see below. Anticipatory guidance regarding course of prenatal care. Agrees to repeat fetal fibronectin next visit. Reviewed red flag symptoms and when to call. RTC x 2 weeks for ROB or sooner if needed.   ULTRASOUND REPORT  Location: [facility] Date of Service: 12/15/2019   Indications: Growth Findings:  Singleton intrauterine pregnancy is visualized with FHR at 122 BPM. Biometrics give an (U/S) Gestational age of [redacted]w[redacted]d and an (U/S) EDD of 02/09/2020; this correlates with the clinically established Estimated Date of Delivery: 02/09/20.  Fetal presentation is Cephalic.  Placenta: posterior. Grade: 1 AFI: WNL  Growth percentile is 38. EFW: 1766 g ( 3 lbs 4 oz)   Impression: 1. [redacted]w[redacted]d Viable Singleton Intrauterine pregnancy previously established criteria. 2. Growth is 38 %ile.    Recommendations: 1.Clinical correlation with the patient's History and Physical Exam.

## 2019-12-15 NOTE — Patient Instructions (Signed)
Round Ligament Pain  The round ligament is a cord of muscle and tissue that helps support the uterus. It can become a source of pain during pregnancy if it becomes stretched or twisted as the baby grows. The pain usually begins in the second trimester (13-28 weeks) of pregnancy, and it can come and go until the baby is delivered. It is not a serious problem, and it does not cause harm to the baby. Round ligament pain is usually a short, sharp, and pinching pain, but it can also be a dull, lingering, and aching pain. The pain is felt in the lower side of the abdomen or in the groin. It usually starts deep in the groin and moves up to the outside of the hip area. The pain may occur when you:  Suddenly change position, such as quickly going from a sitting to standing position.  Roll over in bed.  Cough or sneeze.  Do physical activity. Follow these instructions at home:   Watch your condition for any changes.  When the pain starts, relax. Then try any of these methods to help with the pain: ? Sitting down. ? Flexing your knees up to your abdomen. ? Lying on your side with one pillow under your abdomen and another pillow between your legs. ? Sitting in a warm bath for 15-20 minutes or until the pain goes away.  Take over-the-counter and prescription medicines only as told by your health care provider.  Move slowly when you sit down or stand up.  Avoid long walks if they cause pain.  Stop or reduce your physical activities if they cause pain.  Keep all follow-up visits as told by your health care provider. This is important. Contact a health care provider if:  Your pain does not go away with treatment.  You feel pain in your back that you did not have before.  Your medicine is not helping. Get help right away if:  You have a fever or chills.  You develop uterine contractions.  You have vaginal bleeding.  You have nausea or vomiting.  You have diarrhea.  You have pain  when you urinate. Summary  Round ligament pain is felt in the lower abdomen or groin. It is usually a short, sharp, and pinching pain. It can also be a dull, lingering, and aching pain.  This pain usually begins in the second trimester (13-28 weeks). It occurs because the uterus is stretching with the growing baby, and it is not harmful to the baby.  You may notice the pain when you suddenly change position, when you cough or sneeze, or during physical activity.  Relaxing, flexing your knees to your abdomen, lying on one side, or taking a warm bath may help to get rid of the pain.  Get help from your health care provider if the pain does not go away or if you have vaginal bleeding, nausea, vomiting, diarrhea, or painful urination. This information is not intended to replace advice given to you by your health care provider. Make sure you discuss any questions you have with your health care provider. Document Revised: 11/04/2017 Document Reviewed: 11/04/2017 Elsevier Patient Education  2020 Else[employer] Inc[employer]   [employer] of the uterus can occur throughout pregnancy, but they are not always a sign that you are in labor. You may have practice contractions called Braxton Hicks contractions. These false labor contractions are sometimes confused with true labor. What are Deberah Pelton contractions? Braxton Hicks contractions are tightening movements that  occur in the muscles of the uterus before labor. Unlike true labor contractions, these contractions do not result in opening (dilation) and thinning of the cervix. Toward the end of pregnancy (32-34 weeks), Braxton Hicks contractions can happen more often and may become stronger. These contractions are sometimes difficult to tell apart from true labor because they can be very uncomfortable. You should not feel embarrassed if you go to the hospital with false labor. Sometimes, the only way to tell if you are in true labor is  for your health care provider to look for changes in the cervix. The health care provider will do a physical exam and may monitor your contractions. If you are not in true labor, the exam should show that your cervix is not dilating and your water has not broken. If there are no other health problems associated with your pregnancy, it is completely safe for you to be sent home with false labor. You may continue to have Braxton Hicks contractions until you go into true labor. How to tell the difference between true labor and false labor True labor  Contractions last 30-70 seconds.  Contractions become very regular.  Discomfort is usually felt in the top of the uterus, and it spreads to the lower abdomen and low back.  Contractions do not go away with walking.  Contractions usually become more intense and increase in frequency.  The cervix dilates and gets thinner. False labor  Contractions are usually shorter and not as strong as true labor contractions.  Contractions are usually irregular.  Contractions are often felt in the front of the lower abdomen and in the groin.  Contractions may go away when you walk around or change positions while lying down.  Contractions get weaker and are shorter-lasting as time goes on.  The cervix usually does not dilate or become thin. Follow these instructions at home:   Take over-the-counter and prescription medicines only as told by your health care provider.  Keep up with your usual exercises and follow other instructions from your health care provider.  Eat and drink lightly if you think you are going into labor.  If Braxton Hicks contractions are making you uncomfortable: ? Change your position from lying down or resting to walking, or change from walking to resting. ? Sit and rest in a tub of warm water. ? Drink enough fluid to keep your urine pale yellow. Dehydration may cause these contractions. ? Do slow and deep breathing several  times an hour.  Keep all follow-up prenatal visits as told by your health care provider. This is important. Contact a health care provider if:  You have a fever.  You have continuous pain in your abdomen. Get help right away if:  Your contractions become stronger, more regular, and closer together.  You have fluid leaking or gushing from your vagina.  You pass blood-tinged mucus (bloody show).  You have bleeding from your vagina.  You have low back pain that you never had before.  You feel your baby's head pushing down and causing pelvic pressure.  Your baby is not moving inside you as much as it used to. Summary  Contractions that occur before labor are called Braxton Hicks contractions, false labor, or practice contractions.  Braxton Hicks contractions are usually shorter, weaker, farther apart, and less regular than true labor contractions. True labor contractions usually become progressively stronger and regular, and they become more frequent.  Manage discomfort from [facility] contractions by changing position, resting in a  warm bath, drinking plenty of water, or practicing deep breathing. This information is not intended to replace advice given to you by your health care provider. Make sure you discuss any questions you have with your health care provider. Document Revised: 05/01/2017 Document Reviewed: 10/02/2016 Elsevier Patient Education  2020 Elsevier Inc.   Fetal Movement Counts Patient Name: ________________________________________________ Patient Due Date: ____________________ What is a fetal movement count?  A fetal movement count is the number of times that you feel your baby move during a certain amount of time. This may also be called a fetal kick count. A fetal movement count is recommended for every pregnant woman. You may be asked to start counting fetal movements as early as week 28 of your pregnancy. Pay attention to when your baby is most active. You  may notice your baby's sleep and wake cycles. You may also notice things that make your baby move more. You should do a fetal movement count:  When your baby is normally most active.  At the same time each day. A good time to count movements is while you are resting, after having something to eat and drink. How do I count fetal movements? 1. Find a quiet, comfortable area. Sit, or lie down on your side. 2. Write down the date, the start time and stop time, and the number of movements that you felt between those two times. Take this information with you to your health care visits. 3. Write down your start time when you feel the first movement. 4. Count kicks, flutters, swishes, rolls, and jabs. You should feel at least 10 movements. 5. You may stop counting after you have felt 10 movements, or if you have been counting for 2 hours. Write down the stop time. 6. If you do not feel 10 movements in 2 hours, contact your health care provider for further instructions. Your health care provider may want to do additional tests to assess your baby's well-being. Contact a health care provider if:  You feel fewer than 10 movements in 2 hours.  Your baby is not moving like he or she usually does. Date: ____________ Start time: ____________ Stop time: ____________ Movements: ____________ Date: ____________ Start time: ____________ Stop time: ____________ Movements: ____________ Date: ____________ Start time: ____________ Stop time: ____________ Movements: ____________ Date: ____________ Start time: ____________ Stop time: ____________ Movements: ____________ Date: ____________ Start time: ____________ Stop time: ____________ Movements: ____________ Date: ____________ Start time: ____________ Stop time: ____________ Movements: ____________ Date: ____________ Start time: ____________ Stop time: ____________ Movements: ____________ Date: ____________ Start time: ____________ Stop time: ____________  Movements: ____________ Date: ____________ Start time: ____________ Stop time: ____________ Movements: ____________ This information is not intended to replace advice given to you by your health care provider. Make sure you discuss any questions you have with your health care provider. Document Revised: 01/06/2019 Document Reviewed: 01/06/2019 Elsevier Patient Education  2020 [employer].

## 2019-12-18 NOTE — Discharge Summary (Signed)
Physician Obstetric Discharge Summary  Patient ID: Dia Donate MRN: [id number] DOB/AGE: 21-Oct-1995 24 y.o.   Date of Admission: 12/11/2019  Date of Discharge: 12/11/2019  Admitting Diagnosis: Observation at [redacted]w[redacted]d  Secondary Diagnosis: Anemia in pregnancy, Abdominal pain in pregnancy. Low weight gain in third trimeter, Late prenatal care, Nausea and vomiting in pregnancy, History of preterm delivery, Varicella non-immune, Pica, Sickle Cell trait, Second trimester bleeding  Discharge Diagnosis: No other diagnosis   Antepartum Procedures: NST, IVF bolus, labs, and medications   Brief Hospital Course   L&D OB Triage Note  Ashlley Booher is a 24 y.o. [id number] female at [redacted]w[redacted]d, EDD Estimated Date of Delivery: 02/09/20 who presented to triage for complaints of pelvic pressure, abdominal pain, and contractions.  She was evaluated by the nurses with no significant findings for preterm labor or fetal distress. Vital signs stable. An NST was performed and has been reviewed by CNM. She was treated with Tylenol, vistaril and an IV fluid bolus. Her fetal fibronectin, UA and wet prep were all negative.   NST INTERPRETATION: Indications: rule out uterine contractions  Mode: External, Intermittent Baseline Rate (A): 125 bpm Variability: Moderate Accelerations: 15 x 15 Decelerations: None Contraction Frequency (min): UI  Impression: reactive   Plan: NST performed was reviewed and was found to be reactive. She was discharged home with bleeding/labor precautions.  Continue routine prenatal care. Follow up with CNM as previously scheduled.   Discharge Instructions: Per After Visit Summary.  Activity: Also refer to After Visit Summary.   Diet: Regular  Medications: Allergies as of 12/11/2019   No Known Allergies     Medication List    ASK your doctor about these medications   ondansetron 4 MG disintegrating tablet Commonly known as: Zofran ODT Take 1 tablet (4 mg total) by mouth every  8 (eight) hours as needed.      Outpatient follow up:   Follow-up Information    Gunnar Bulla, CNM Follow up.   Specialties: Certified Nurse Midwife, Obstetrics and Gynecology, Radiology Why: Keep scheduled appointments Contact information: [address] [place] [address] [phone]              Postpartum contraception: Nexplanon  Discharged Condition: stable  Discharged to: home   Serafina Royals, CNM  [facility], [facility]

## 2019-12-19 ENCOUNTER — Telehealth: Payer: Self-pay | Admitting: Certified Nurse Midwife

## 2019-12-19 ENCOUNTER — Encounter: Payer: Self-pay | Admitting: Emergency Medicine

## 2019-12-19 ENCOUNTER — Other Ambulatory Visit: Payer: Self-pay

## 2019-12-19 ENCOUNTER — Observation Stay
Admission: EM | Admit: 2019-12-19 | Discharge: 2019-12-19 | Disposition: A | Discharge status: Home / Self Care | Payer: Medicaid Other | Attending: Obstetrics and Gynecology | Admitting: Obstetrics and Gynecology

## 2019-12-19 DIAGNOSIS — O368130 Decreased fetal movements, third trimester, not applicable or unspecified: Secondary | ICD-10-CM

## 2019-12-19 DIAGNOSIS — O368190 Decreased fetal movements, unspecified trimester, not applicable or unspecified: Secondary | ICD-10-CM | POA: Diagnosis present

## 2019-12-19 DIAGNOSIS — R42 Dizziness and giddiness: Secondary | ICD-10-CM | POA: Diagnosis not present

## 2019-12-19 DIAGNOSIS — Z3490 Encounter for supervision of normal pregnancy, unspecified, unspecified trimester: Secondary | ICD-10-CM

## 2019-12-19 DIAGNOSIS — Z3A32 32 weeks gestation of pregnancy: Secondary | ICD-10-CM | POA: Diagnosis not present

## 2019-12-19 DIAGNOSIS — R531 Weakness: Secondary | ICD-10-CM | POA: Diagnosis not present

## 2019-12-19 DIAGNOSIS — O26893 Other specified pregnancy related conditions, third trimester: Secondary | ICD-10-CM | POA: Diagnosis not present

## 2019-12-19 LAB — URINALYSIS, COMPLETE (UACMP) WITH MICROSCOPIC
Bilirubin Urine: NEGATIVE
Glucose, UA: NEGATIVE mg/dL
Hgb urine dipstick: NEGATIVE
Ketones, ur: NEGATIVE mg/dL
Nitrite: NEGATIVE
Protein, ur: NEGATIVE mg/dL
Specific Gravity, Urine: 1.009 (ref 1.005–1.030)
pH: 7 (ref 5.0–8.0)

## 2019-12-19 LAB — BASIC METABOLIC PANEL
Anion gap: 6 (ref 5–15)
BUN: <5 mg/dL — ABNORMAL LOW (ref 6–20)
CO2: 25 mmol/L (ref 22–32)
Calcium: 8.7 mg/dL — ABNORMAL LOW (ref 8.9–10.3)
Chloride: 105 mmol/L (ref 98–111)
Creatinine, Ser: 0.5 mg/dL (ref 0.44–1.00)
GFR calc Af Amer: >60 mL/min (ref >60)
GFR calc non Af Amer: >60 mL/min (ref >60)
Glucose, Bld: 83 mg/dL (ref 70–99)
Potassium: 3.4 mmol/L — ABNORMAL LOW (ref 3.5–5.1)
Sodium: 136 mmol/L (ref 135–145)

## 2019-12-19 LAB — CBC
HCT: 28.5 % — ABNORMAL LOW (ref 36.0–46.0)
Hemoglobin: 9.3 g/dL — ABNORMAL LOW (ref 12.0–15.0)
MCH: 25.2 pg — ABNORMAL LOW (ref 26.0–34.0)
MCHC: 32.6 g/dL (ref 30.0–36.0)
MCV: 77.2 fL — ABNORMAL LOW (ref 80.0–100.0)
Platelets: 161 10*3/uL (ref 150–400)
RBC: 3.69 MIL/uL — ABNORMAL LOW (ref 3.87–5.11)
RDW: 17.3 % — ABNORMAL HIGH (ref 11.5–15.5)
WBC: 3.8 10*3/uL — ABNORMAL LOW (ref 4.0–10.5)
nRBC: 0 % (ref 0.0–0.2)

## 2019-12-19 LAB — RUPTURE OF MEMBRANE (ROM)PLUS: Rom Plus: NEGATIVE

## 2019-12-19 LAB — GROUP B STREP BY PCR: Group B strep by PCR: NEGATIVE

## 2019-12-19 LAB — GLUCOSE, CAPILLARY: Glucose-Capillary: 85 mg/dL (ref 70–99)

## 2019-12-19 MED ORDER — ACETAMINOPHEN 500 MG PO TABS: 1000.0000 mg | ORAL_TABLET | Freq: Four times a day (QID) | ORAL | Status: DC | PRN

## 2019-12-19 MED ORDER — BETAMETHASONE SOD PHOS & ACET 6 (3-3) MG/ML IJ SUSP
INTRAMUSCULAR | Status: AC
  Filled 2019-12-19: qty 5

## 2019-12-19 MED ORDER — SODIUM CHLORIDE 0.9 % IV BOLUS
1000.0000 mL | Freq: Once | INTRAVENOUS | Status: AC
  Administered 2019-12-19: 1000 mL via INTRAVENOUS

## 2019-12-19 MED ORDER — HYDROXYZINE HCL 25 MG PO TABS
25.0000 mg | ORAL_TABLET | Freq: Three times a day (TID) | ORAL | Status: DC | PRN
  Administered 2019-12-19: 25 mg via ORAL
  Filled 2019-12-19 (×3): qty 1

## 2019-12-19 MED ORDER — BETAMETHASONE SOD PHOS & ACET 6 (3-3) MG/ML IJ SUSP
12.0000 mg | Freq: Once | INTRAMUSCULAR | Status: AC
  Administered 2019-12-19: 12 mg via INTRAMUSCULAR

## 2019-12-19 MED ORDER — LACTATED RINGERS IV BOLUS
1000.0000 mL | Freq: Once | INTRAVENOUS | Status: AC
  Administered 2019-12-19: 1000 mL via INTRAVENOUS

## 2019-12-19 NOTE — ED Notes (Signed)
Pt roomed at this time; Corrie Dandy, RN to bedside.  EDP, Derrill Kay made aware of patient presentation.

## 2019-12-19 NOTE — ED Notes (Signed)
Pt from home with dizziness and blurred vision this afternoon. Pt states she is on bed rest due to high risk pregnancy and that she began to feel lightheaded while lying down. Pt states she is still lightheaded but that her vision seems to have cleared up.

## 2019-12-19 NOTE — ED Notes (Signed)
Pt discharged off the floor to l&D obs 3

## 2019-12-19 NOTE — OB Triage Note (Signed)
Pt. Was seen in ED for dizziness, and blurred vision.Sent up for monitoring due to complaint of dizzinesss.

## 2019-12-19 NOTE — ED Triage Notes (Signed)
Pt in via POV, reports onset of dizziness and  blurred vision today but is unable to recall time of onset.  Pt A/Ox4, pt lethargic, delayed responses noted.  Pt is [redacted] weeks pregnant, reports gestational anemia with this pregnancy.    Vitals WDL.

## 2019-12-19 NOTE — ED Notes (Signed)
Pt having contractions 7-10 minutes apart; dr. Derrill Kay spoke with dr. Valentino Saxon. Pt to be taken to L&D OBS3

## 2019-12-19 NOTE — ED Provider Notes (Signed)
[facility] Emergency Department Provider Note   ____________________________________________   I have reviewed the triage vital signs and the nursing notes.   HISTORY  Chief Complaint Dizziness and Blurred Vision   History limited by: Not Limited   HPI Yolanda Garcia is a 24 y.o. female who presents to the emergency department today because of an episode of blurred vision and dizziness.  Patient states that she is roughly [redacted] weeks pregnant.  She is not been feeling well this morning.  Got up to get some cereal and then went back to bed.  She states that later on in the day she started developing dizziness and blurred vision.  She states she was having a hard time seeing out of both eyes.  She also started having significant generalized weakness and stated she needed family members to help her get up.  At the time my exam she states she is feeling better although continues to have some dizziness and generalized weakness.  The patient denies any chest pain.  She denies any fevers.  Denies similar symptoms in the past. She does state she feels like her baby has not moved as much as it normally does throughout the day.   Records reviewed. Per medical record review patient has a history of anemia.   Past Medical History:  Diagnosis Date  . Anemia   . Panic attack     Patient Active Problem List   Diagnosis Date Noted  . Indication for care in labor or delivery 12/11/2019  . History of preterm delivery 12/07/2019  . Iron deficiency anemia 11/25/2019  . Pica 11/19/2019  . Low weight gain during pregnancy in third trimester 11/18/2019  . Pregnancy 10/28/2019  . Abdominal pain affecting pregnancy   . Maternal varicella, non-immune 09/10/2019  . Second trimester bleeding 09/09/2019  . Late prenatal care 09/09/2019  . Short interval between pregnancies affecting pregnancy in second trimester, antepartum 09/09/2019  . Nausea/vomiting in pregnancy 01/25/2019  .  Pelvic pressure in pregnancy 01/25/2019  . Anemia in pregnancy 01/05/2019  . Sickle cell trait (HCC) 08/23/2018    Past Surgical History:  Procedure Laterality Date  . NO PAST SURGERIES      Prior to Admission medications   Medication Sig Start Date End Date Taking? Authorizing Provider  hydrOXYzine (ATARAX/VISTARIL) 25 MG tablet Take 1 tablet (25 mg total) by mouth every 6 (six) hours as needed for anxiety (contractions). 12/15/19   Lawhorn, Vanessa Hoople, CNM  ondansetron (ZOFRAN ODT) 4 MG disintegrating tablet Take 1 tablet (4 mg total) by mouth every 8 (eight) hours as needed. 11/29/19   Gunnar Bulla, CNM    Allergies Patient has no known allergies.  Family History  Problem Relation Age of Onset  . Healthy Mother     Social History Social History   Tobacco Use  . Smoking status: Never Smoker  . Smokeless tobacco: Never Used  Vaping Use  . Vaping Use: Never used  Substance Use Topics  . Alcohol use: No  . Drug use: Never    Review of Systems Constitutional: No fever/chills Eyes: Positive for blurry vision. ENT: No sore throat. Cardiovascular: Denies chest pain. Respiratory: Denies shortness of breath. Gastrointestinal: No abdominal pain.  No nausea, no vomiting.  No diarrhea.   Genitourinary: Negative for dysuria. Musculoskeletal: Negative for back pain. Skin: Negative for rash. Neurological: Positive for dizziness, generalized weakness. ____________________________________________   PHYSICAL EXAM:  VITAL SIGNS: ED Triage Vitals  Enc Vitals Group  BP 12/19/19 1502 (!) 101/49     Pulse Rate 12/19/19 1502 (!) 103     Resp 12/19/19 1502 15     Temp 12/19/19 1502 98.4 F (36.9 C)     Temp Source 12/19/19 1502 Oral     SpO2 12/19/19 1502 100 %     Weight --      Height 12/19/19 1452 5\' 6"  (1.676 m)     Head Circumference --      Peak Flow --      Pain Score 12/19/19 1452 10   Constitutional: Alert and oriented.  Eyes: Conjunctivae  are normal.  ENT      Head: Normocephalic and atraumatic.      Nose: No congestion/rhinnorhea.      Mouth/Throat: Mucous membranes are moist.      Neck: No stridor. Hematological/Lymphatic/Immunilogical: No cervical lymphadenopathy. Cardiovascular: Normal rate, regular rhythm.  No murmurs, rubs, or gallops. Respiratory: Normal respiratory effort without tachypnea nor retractions. Breath sounds are clear and equal bilaterally. No wheezes/rales/rhonchi. Gastrointestinal: Soft and non tender. No rebound. No guarding.  Genitourinary: Deferred Musculoskeletal: Normal range of motion in all extremities. No lower extremity edema. Neurologic:  Normal speech and language. EOMI. PERRL. Face symmetric. Able to lift all extremities off of bed, however weaker than would be expected in all extremities.  Skin:  Skin is warm, dry and intact. No rash noted. Psychiatric: Mood and affect are normal. Speech and behavior are normal. Patient exhibits appropriate insight and judgment.  ____________________________________________    LABS (pertinent positives/negatives)  BMP na 136, k 3.4, cl 105, glu 83, cr 0.50 CBC wbc 3.8, hgb 9.3, plt 161 ____________________________________________   EKG  I, 12/21/19, attending physician, personally viewed and interpreted this EKG  EKG Time: 1448 Rate: 98 Rhythm: sinus rhythm with pvcs Axis: normal Intervals: qtc 434 QRS: narrow ST changes: no st elevation Impression: abnormal ekg   ____________________________________________    RADIOLOGY  None  ____________________________________________   PROCEDURES  Procedures  ____________________________________________   INITIAL IMPRESSION / ASSESSMENT AND PLAN / ED COURSE  Pertinent labs & imaging results that were available during my care of the patient were reviewed by me and considered in my medical decision making (see chart for details).   Patient presented to the emergency department  today because of an episode of dizziness, blurred vision, weakness and difficulty with speech.  At the time my exam patient is feeling better although still has some residual symptoms.  On my exam her strength is symmetric albeit slightly less than I would expect for her normal.  Blood work without concerning electrolyte abnormality.  Patient does have anemia however it appears to be at patient's baseline.  At this point unclear cause of the patient's symptoms although I do wonder if she might of had a brief episode of hypoglycemia.  She also complained of not feeling any movement.  Discussed with Dr. Phineas Semen with OB/GYN.  Will plan on sending patient to labor and delivery.  After my initial discussion with Dr. Valentino Saxon the patient started complaining of contractions.  Plan will again be to send patient to labor and delivery although I did contact Dr. Valentino Saxon with this update.  ____________________________________________   FINAL CLINICAL IMPRESSION(S) / ED DIAGNOSES  Final diagnoses:  Dizziness  Weakness     Note: This dictation was prepared with Dragon dictation. Any transcriptional errors that result from this process are unintentional     Valentino Saxon, MD 12/19/19 1740

## 2019-12-19 NOTE — ED Notes (Signed)
As this RN is finished with triage, pt inquires about when she can get to a room.  Pt then states, "I think something is wrong with my baby, I am not feeling her move."  States last fetal movement around 0200 today.    Pt advised that we would check FHT, but that L&D would not take her until she had been cleared medically by an EDP.

## 2019-12-19 NOTE — Telephone Encounter (Signed)
Pts mom Sherrie called in and stated that her daughter was over at the ED and that she wanted to talk to [place]. The ED was triaging the pt. I called back to [place] and transferred the pts mom to her.

## 2019-12-19 NOTE — Telephone Encounter (Signed)
Telephone call from patient's mother.   Reports she in in ER awaiting triage.   Advised mother to have patient wait in triage and she will be sent to labor and delivery for further evaluation if needed.   Yolanda Garcia, CNM [facility], [facility] 12/19/19 3:48 PM

## 2019-12-19 NOTE — Discharge Instructions (Addendum)
Go to ED front desk for transport to  labor and delivery for Betamethasone shot at 5:30pm Tuesday

## 2019-12-20 ENCOUNTER — Observation Stay
Admission: EM | Admit: 2019-12-20 | Discharge: 2019-12-20 | Disposition: A | Discharge status: Home / Self Care | Payer: Medicaid Other | Attending: Obstetrics and Gynecology | Admitting: Obstetrics and Gynecology

## 2019-12-20 DIAGNOSIS — O09213 Supervision of pregnancy with history of pre-term labor, third trimester: Secondary | ICD-10-CM | POA: Diagnosis not present

## 2019-12-20 DIAGNOSIS — Z3A32 32 weeks gestation of pregnancy: Secondary | ICD-10-CM | POA: Insufficient documentation

## 2019-12-20 DIAGNOSIS — D573 Sickle-cell trait: Secondary | ICD-10-CM | POA: Diagnosis not present

## 2019-12-20 DIAGNOSIS — O09893 Supervision of other high risk pregnancies, third trimester: Secondary | ICD-10-CM | POA: Insufficient documentation

## 2019-12-20 DIAGNOSIS — O99013 Anemia complicating pregnancy, third trimester: Secondary | ICD-10-CM | POA: Diagnosis not present

## 2019-12-20 MED ORDER — BETAMETHASONE SOD PHOS & ACET 6 (3-3) MG/ML IJ SUSP
INTRAMUSCULAR | Status: AC
  Administered 2019-12-20: 12 mg via INTRAMUSCULAR
  Filled 2019-12-20: qty 5

## 2019-12-20 MED ORDER — BETAMETHASONE SOD PHOS & ACET 6 (3-3) MG/ML IJ SUSP
12.0000 mg | Freq: Once | INTRAMUSCULAR | Status: AC
  Filled 2019-12-20: qty 2

## 2019-12-20 NOTE — OB Triage Note (Signed)
Patient here for 2nd betamethasone injection. Lawhorn, CNM notified of patient's arrival and stable condition. Injection given and no questions at this time.

## 2019-12-20 NOTE — Discharge Summary (Signed)
Obstetric Discharge Summary  Patient ID: Quianna Avery MRN: [id number] DOB/AGE: Nov 05, 1995 24 y.o.   Date of Admission: 12/19/2019  Date of Discharge: 12/19/2019  Admitting Diagnosis: Observation at [redacted]w[redacted]d  Secondary Diagnosis:   Patient Active Problem List   Diagnosis Date Noted   Decreased fetal movement 12/19/2019   Pregnant 12/19/2019   Indication for care in labor or delivery 12/11/2019   History of preterm delivery 12/07/2019   Iron deficiency anemia 11/25/2019   Pica 11/19/2019   Low weight gain during pregnancy in third trimester 11/18/2019   Pregnancy 10/28/2019   Abdominal pain affecting pregnancy    Maternal varicella, non-immune 09/10/2019   Second trimester bleeding 09/09/2019   Late prenatal care 09/09/2019   Short interval between pregnancies affecting pregnancy in second trimester, antepartum 09/09/2019   Nausea/vomiting in pregnancy 01/25/2019   Pelvic pressure in pregnancy 01/25/2019   Anemia in pregnancy 01/05/2019   Sickle cell trait (HCC) 08/23/2018      Discharge Diagnosis: No other diagnosis   Antepartum Procedures: NST, IV Fluid bolus, Reguar Diet and PO medications   Brief Hospital Course   L&D OB Triage Note  Ilianna Bown is a 24 y.o. G12P0101 female at [redacted]w[redacted]d, EDD Estimated Date of Delivery: 02/09/20 who presented to triage for complaints of decreased fetal movement and contractions. Patient was elevated by dizziness, weakness, and blurred vision in the Emergency Room and deemed stable for discharge to Labor and Delivery for further evaluation.  She was evaluated by the nurses with no significant findings for preterm labor or fetal distress. Vital signs stable. An NST was performed and has been reviewed by CNM. She was treated with IV fluid bolus, regular diet, Tylenol and Vistaril. She was given a dose of betamethasone due to history of previous preterm delivery and short interval between pregnancies.   NST  INTERPRETATION:  Indications: rule out uterine contractions  Mode: External Baseline Rate (A): 125 bpm Variability: Moderate Accelerations: 15 x 15 Decelerations: None Contraction Frequency (min): UI  Impression: reactive  Dilation: 1 Effacement (%): 50 Cervical Position: Posterior Station: Ballotable Exam by:: JDaley  Plan: NST performed was reviewed and was found to be reactive. She was discharged home with bleeding/labor precautions.  Continue routine prenatal care. Follow up with CNM as previously scheduled on Thursday. Return to Labor and Delivery tomorrow for second dose to betamethasone.   Discharge Instructions: Per After Visit Summary.  Activity: Also refer to After Visit Summary.  Diet: Regular  Medications: Allergies as of 12/19/2019   No Known Allergies     Medication List    ASK your doctor about these medications   hydrOXYzine 25 MG tablet Commonly known as: ATARAX/VISTARIL Take 1 tablet (25 mg total) by mouth every 6 (six) hours as needed for anxiety (contractions).   ondansetron 4 MG disintegrating tablet Commonly known as: Zofran ODT Take 1 tablet (4 mg total) by mouth every 8 (eight) hours as needed.      Outpatient follow up:   Follow-up Information    [facility] LABOR AND DELIVERY Follow up.   Specialty: Obstetrics and Gynecology Why: come thru ED for admission to L&D for [facility] injection Contact information: [id number] [place] [address] [phone]             Postpartum contraception: Nexplanon  Discharged Condition: stable  Discharged to: home   Serafina Royals, CNM  [facility], [facility]

## 2019-12-22 ENCOUNTER — Other Ambulatory Visit: Payer: Self-pay

## 2019-12-22 ENCOUNTER — Ambulatory Visit (INDEPENDENT_AMBULATORY_CARE_PROVIDER_SITE_OTHER): Payer: Medicaid Other | Admitting: Certified Nurse Midwife

## 2019-12-22 ENCOUNTER — Other Ambulatory Visit: Payer: Medicaid Other

## 2019-12-22 VITALS — BP 99/49 | HR 87 | Wt 113.2 lb

## 2019-12-22 DIAGNOSIS — O99013 Anemia complicating pregnancy, third trimester: Secondary | ICD-10-CM

## 2019-12-22 DIAGNOSIS — M549 Dorsalgia, unspecified: Secondary | ICD-10-CM

## 2019-12-22 DIAGNOSIS — R102 Pelvic and perineal pain: Secondary | ICD-10-CM

## 2019-12-22 DIAGNOSIS — Z3A33 33 weeks gestation of pregnancy: Secondary | ICD-10-CM

## 2019-12-22 DIAGNOSIS — O26899 Other specified pregnancy related conditions, unspecified trimester: Secondary | ICD-10-CM

## 2019-12-22 DIAGNOSIS — O99891 Other specified diseases and conditions complicating pregnancy: Secondary | ICD-10-CM

## 2019-12-22 DIAGNOSIS — Z3483 Encounter for supervision of other normal pregnancy, third trimester: Secondary | ICD-10-CM

## 2019-12-22 DIAGNOSIS — O2613 Low weight gain in pregnancy, third trimester: Secondary | ICD-10-CM

## 2019-12-22 LAB — POCT URINALYSIS DIPSTICK OB
Bilirubin, UA: NEGATIVE
Blood, UA: NEGATIVE
Glucose, UA: NEGATIVE
Ketones, UA: NEGATIVE
Leukocytes, UA: NEGATIVE
Nitrite, UA: NEGATIVE
POC,PROTEIN,UA: NEGATIVE
Spec Grav, UA: 1.01 (ref 1.010–1.025)
Urobilinogen, UA: 0.2 E.U./dL
pH, UA: 5 (ref 5.0–8.0)

## 2019-12-22 MED ORDER — CYCLOBENZAPRINE HCL 5 MG PO TABS: 5.0000 mg | ORAL_TABLET | Freq: Every day | ORAL | 0 refills | Status: DC

## 2019-12-22 NOTE — Progress Notes (Signed)
ROB-Doing well since last triage visit. Reports intermittent back pain when resting requests medication stronger than Tylenol. Rx Flexeril, see orders. Anticipatory guidance regarding course of prenatal care. Reviewed red flag symptoms and when to call. RTC x 1 week for FFN and ROB or sooner if needed.

## 2019-12-22 NOTE — Discharge Summary (Signed)
Obstetric Discharge Summary  Patient ID: Cloria Ciresi MRN: [id number] DOB/AGE: 22/24/1997 23 y.o.   Date of Admission: 12/20/2019  Date of Discharge: 12/20/2019  Admitting Diagnosis: Observation at [redacted]w[redacted]d  Secondary Diagnosis:   Patient Active Problem List   Diagnosis Date Noted  . Indication for care/intervention related to labor/delivery, antepartum 12/20/2019  . Decreased fetal movement 12/19/2019  . Pregnant 12/19/2019  . Indication for care in labor or delivery 12/11/2019  . History of preterm delivery 12/07/2019  . Iron deficiency anemia 11/25/2019  . Pica 11/19/2019  . Low weight gain during pregnancy in third trimester 11/18/2019  . Pregnancy 10/28/2019  . Abdominal pain affecting pregnancy   . Maternal varicella, non-immune 09/10/2019  . Second trimester bleeding 09/09/2019  . Late prenatal care 09/09/2019  . Short interval between pregnancies affecting pregnancy in second trimester, antepartum 09/09/2019  . Nausea/vomiting in pregnancy 01/25/2019  . Pelvic pressure in pregnancy 01/25/2019  . Anemia in pregnancy 01/05/2019  . Sickle cell trait (HCC) 08/23/2018    Discharge Diagnosis: No other diagnosis   Antepartum Procedures: Betamethasone injection, see [facility]  Brief Hospital Course   L&D OB Triage Note  Amiree No is a 24 y.o. G63P0101 female at [redacted]w[redacted]d, EDD Estimated Date of Delivery: 02/09/20 who presented to labor and delivery of second dose of betamethasone due to preterm contractions with history of previous preterm delivery and short interval pregnancy, see MAR.   Plan: She was discharged home with bleeding/labor precautions.  Continue routine prenatal care. Follow up with CNM as previously scheduled.    Discharge Instructions: Per After Visit Summary.  Activity: Also refer to After Visit Summary.   Diet: Regular  Medications: Allergies as of 12/20/2019   No Known Allergies     Medication List    ASK your doctor about these medications    hydrOXYzine 25 MG tablet Commonly known as: ATARAX/VISTARIL Take 1 tablet (25 mg total) by mouth every 6 (six) hours as needed for anxiety (contractions).   ondansetron 4 MG disintegrating tablet Commonly known as: Zofran ODT Take 1 tablet (4 mg total) by mouth every 8 (eight) hours as needed.      Outpatient follow up: on Thursday as previously scheduled or sooner if needed  Postpartum contraception: Nexplanon  Discharged Condition: stable  Discharged to: home   Serafina Royals, CNM  [facility], [facility]

## 2019-12-22 NOTE — Patient Instructions (Signed)
Cyclobenzaprine tablets What is this medicine? CYCLOBENZAPRINE (sye kloe BEN za preen) is a muscle relaxer. It is used to treat muscle pain, spasms, and stiffness. This medicine may be used for other purposes; ask your health care provider or pharmacist if you have questions. COMMON BRAND NAME(S): Fexmid, Flexeril What should I tell my health care provider before I take this medicine? They need to know if you have any of these conditions:  heart disease, irregular heartbeat, or previous heart attack  liver disease  thyroid problem  an unusual or allergic reaction to cyclobenzaprine, tricyclic antidepressants, lactose, other medicines, foods, dyes, or preservatives  pregnant or trying to get pregnant  breast-feeding How should I use this medicine? Take this medicine by mouth with a glass of water. Follow the directions on the prescription label. If this medicine upsets your stomach, take it with food or milk. Take your medicine at regular intervals. Do not take it more often than directed. Talk to your pediatrician regarding the use of this medicine in children. Special care may be needed. Overdosage: If you think you have taken too much of this medicine contact a poison control center or emergency room at once. NOTE: This medicine is only for you. Do not share this medicine with others. What if I miss a dose? If you miss a dose, take it as soon as you can. If it is almost time for your next dose, take only that dose. Do not take double or extra doses. What may interact with this medicine? Do not take this medicine with any of the following medications:  MAOIs like Carbex, Eldepryl, Marplan, Nardil, and Parnate  narcotic medicines for cough  safinamide This medicine may also interact with the following medications:  alcohol  bupropion  antihistamines for allergy, cough and cold  certain medicines for anxiety or sleep  certain medicines for bladder problems like oxybutynin,  tolterodine  certain medicines for depression like amitriptyline, fluoxetine, sertraline  certain medicines for Parkinson's disease like benztropine, trihexyphenidyl  certain medicines for seizures like phenobarbital, primidone  certain medicines for stomach problems like dicyclomine, hyoscyamine  certain medicines for travel sickness like scopolamine  general anesthetics like halothane, isoflurane, methoxyflurane, propofol  ipratropium  local anesthetics like lidocaine, pramoxine, tetracaine  medicines that relax muscles for surgery  narcotic medicines for pain  phenothiazines like chlorpromazine, mesoridazine, prochlorperazine, thioridazine  verapamil This list may not describe all possible interactions. Give your health care provider a list of all the medicines, herbs, non-prescription drugs, or dietary supplements you use. Also tell them if you smoke, drink alcohol, or use illegal drugs. Some items may interact with your medicine. What should I watch for while using this medicine? Tell your doctor or health care professional if your symptoms do not start to get better or if they get worse. You may get drowsy or dizzy. Do not drive, use machinery, or do anything that needs mental alertness until you know how this medicine affects you. Do not stand or sit up quickly, especially if you are an older patient. This reduces the risk of dizzy or fainting spells. Alcohol may interfere with the effect of this medicine. Avoid alcoholic drinks. If you are taking another medicine that also causes drowsiness, you may have more side effects. Give your health care provider a list of all medicines you use. Your doctor will tell you how much medicine to take. Do not take more medicine than directed. Call emergency for help if you have problems breathing or unusual sleepiness.  Your mouth may get dry. Chewing sugarless gum or sucking hard candy, and drinking plenty of water may help. Contact your  doctor if the problem does not go away or is severe. What side effects may I notice from receiving this medicine? Side effects that you should report to your doctor or health care professional as soon as possible:  allergic reactions like skin rash, itching or hives, swelling of the face, lips, or tongue  breathing problems  chest pain  fast, irregular heartbeat  hallucinations  seizures  unusually weak or tired Side effects that usually do not require medical attention (report to your doctor or health care professional if they continue or are bothersome):  headache  nausea, vomiting This list may not describe all possible side effects. Call your doctor for medical advice about side effects. You may report side effects to FDA at 1-800-FDA-1088. Where should I keep my medicine? Keep out of the reach of children. Store at room temperature between 15 and 30 degrees C (59 and 86 degrees F). Keep container tightly closed. Throw away any unused medicine after the expiration date. NOTE: This sheet is a summary. It may not cover all possible information. If you have questions about this medicine, talk to your doctor, pharmacist, or health care provider.  2020 Elsevier/Gold Standard (2018-04-21 12:49:26)   Fetal Movement Counts Patient Name: ________________________________________________ Patient Due Date: ____________________ What is a fetal movement count?  A fetal movement count is the number of times that you feel your baby move during a certain amount of time. This may also be called a fetal kick count. A fetal movement count is recommended for every pregnant woman. You may be asked to start counting fetal movements as early as week 28 of your pregnancy. Pay attention to when your baby is most active. You may notice your baby's sleep and wake cycles. You may also notice things that make your baby move more. You should do a fetal movement count:  When your baby is normally most  active.  At the same time each day. A good time to count movements is while you are resting, after having something to eat and drink. How do I count fetal movements? 1. Find a quiet, comfortable area. Sit, or lie down on your side. 2. Write down the date, the start time and stop time, and the number of movements that you felt between those two times. Take this information with you to your health care visits. 3. Write down your start time when you feel the first movement. 4. Count kicks, flutters, swishes, rolls, and jabs. You should feel at least 10 movements. 5. You may stop counting after you have felt 10 movements, or if you have been counting for 2 hours. Write down the stop time. 6. If you do not feel 10 movements in 2 hours, contact your health care provider for further instructions. Your health care provider may want to do additional tests to assess your baby's well-being. Contact a health care provider if:  You feel fewer than 10 movements in 2 hours.  Your baby is not moving like he or she usually does. Date: ____________ Start time: ____________ Stop time: ____________ Movements: ____________ Date: ____________ Start time: ____________ Stop time: ____________ Movements: ____________ Date: ____________ Start time: ____________ Stop time: ____________ Movements: ____________ Date: ____________ Start time: ____________ Stop time: ____________ Movements: ____________ Date: ____________ Start time: ____________ Stop time: ____________ Movements: ____________ Date: ____________ Start time: ____________ Stop time: ____________ Movements: ____________ Date: ____________  Start time: ____________ Stop time: ____________ Movements: ____________ Date: ____________ Start time: ____________ Stop time: ____________ Movements: ____________ Date: ____________ Start time: ____________ Stop time: ____________ Movements: ____________ This information is not intended to replace advice given to you by  your health care provider. Make sure you discuss any questions you have with your health care provider. Document Revised: 01/06/2019 Document Reviewed: 01/06/2019 Elsevier Patient Education  2020 [employer].

## 2019-12-28 ENCOUNTER — Other Ambulatory Visit: Payer: Self-pay

## 2019-12-28 ENCOUNTER — Observation Stay
Admission: EM | Admit: 2019-12-28 | Discharge: 2019-12-29 | Disposition: A | Discharge status: Home / Self Care | Payer: Medicaid Other | Attending: Obstetrics and Gynecology | Admitting: Obstetrics and Gynecology

## 2019-12-28 ENCOUNTER — Encounter: Payer: Self-pay | Admitting: Obstetrics and Gynecology

## 2019-12-28 DIAGNOSIS — O26893 Other specified pregnancy related conditions, third trimester: Secondary | ICD-10-CM | POA: Diagnosis not present

## 2019-12-28 DIAGNOSIS — O4703 False labor before 37 completed weeks of gestation, third trimester: Secondary | ICD-10-CM | POA: Diagnosis not present

## 2019-12-28 DIAGNOSIS — D573 Sickle-cell trait: Secondary | ICD-10-CM | POA: Insufficient documentation

## 2019-12-28 DIAGNOSIS — Z3A33 33 weeks gestation of pregnancy: Secondary | ICD-10-CM | POA: Diagnosis not present

## 2019-12-28 DIAGNOSIS — Z3490 Encounter for supervision of normal pregnancy, unspecified, unspecified trimester: Secondary | ICD-10-CM | POA: Diagnosis present

## 2019-12-28 DIAGNOSIS — Z79899 Other long term (current) drug therapy: Secondary | ICD-10-CM | POA: Insufficient documentation

## 2019-12-28 LAB — FETAL FIBRONECTIN: Fetal Fibronectin: NEGATIVE

## 2019-12-28 LAB — WET PREP, GENITAL
Sperm: NONE SEEN
Trich, Wet Prep: NONE SEEN
Yeast Wet Prep HPF POC: NONE SEEN

## 2019-12-28 MED ORDER — LIDOCAINE 5 % EX PTCH
MEDICATED_PATCH | CUTANEOUS | Status: AC
  Filled 2019-12-28: qty 1

## 2019-12-28 MED ORDER — ACETAMINOPHEN 500 MG PO TABS
1000.0000 mg | ORAL_TABLET | Freq: Once | ORAL | Status: AC
  Administered 2019-12-28: 1000 mg via ORAL

## 2019-12-28 MED ORDER — LIDOCAINE 5 % EX PTCH
1.0000 | MEDICATED_PATCH | Freq: Once | CUTANEOUS | Status: DC
  Administered 2019-12-28: 1 via TRANSDERMAL

## 2019-12-28 MED ORDER — LACTATED RINGERS IV BOLUS: 1000.0000 mL | Freq: Once | INTRAVENOUS | Status: DC

## 2019-12-28 MED ORDER — ACETAMINOPHEN 500 MG PO TABS
ORAL_TABLET | ORAL | Status: AC
  Filled 2019-12-28: qty 2

## 2019-12-28 NOTE — OB Triage Note (Signed)
[facility] with complaints of contractions. The pt states that the contractions began tonight around 1945. Pt states that she has been feeling contractions every 2 mins. Pt rates pain 9/10. Pt denies vaginal bleeding, LOF, N/V. Pt states that she has been able to feel her baby move. Monitors applied and assessing. Initial FHT 135.

## 2019-12-29 DIAGNOSIS — Z3A33 33 weeks gestation of pregnancy: Secondary | ICD-10-CM

## 2019-12-29 DIAGNOSIS — R102 Pelvic and perineal pain: Secondary | ICD-10-CM | POA: Diagnosis not present

## 2019-12-29 DIAGNOSIS — O4703 False labor before 37 completed weeks of gestation, third trimester: Secondary | ICD-10-CM | POA: Diagnosis not present

## 2019-12-29 NOTE — Discharge Summary (Addendum)
Obstetric Discharge Summary  Patient ID: Yolanda Garcia MRN: [id number] DOB/AGE: January 09, 1996 24 y.o.   Date of Admission: 12/28/2019  Date of Discharge: 12/29/2019  Admitting Diagnosis: Observation at [redacted]w[redacted]d  Secondary Diagnosis:   Patient Active Problem List   Diagnosis Date Noted   Indication for care/intervention related to labor/delivery, antepartum 12/20/2019   Decreased fetal movement 12/19/2019   Pregnant 12/19/2019   Indication for care in labor or delivery 12/11/2019   History of preterm delivery 12/07/2019   Iron deficiency anemia 11/25/2019   Pica 11/19/2019   Low weight gain during pregnancy in third trimester 11/18/2019   Pregnancy 10/28/2019   Abdominal pain affecting pregnancy    Maternal varicella, non-immune 09/10/2019   Second trimester bleeding 09/09/2019   Late prenatal care 09/09/2019   Short interval between pregnancies affecting pregnancy in second trimester, antepartum 09/09/2019   Nausea/vomiting in pregnancy 01/25/2019   Pelvic pressure in pregnancy 01/25/2019   Anemia in pregnancy 01/05/2019   Sickle cell trait (HCC) 08/23/2018       Discharge Diagnosis: No other diagnosis   Antepartum Procedures: NST   Brief Hospital Course   L&D OB Triage Note  Yolanda Garcia is a 24 y.o. G50P0101 female at [redacted]w[redacted]d, EDD Estimated Date of Delivery: 02/09/20 who presented to triage for complaints of contractions.  She was evaluated by the nurses with no significant findings for preterm labor or fetal distress. Vital signs stable. An NST was performed and has been reviewed by CNM. She was treated with Tylenol, a sandwich tray and water. She declined SVE and IVF bolus.    NST INTERPRETATION: Indications: rule out uterine contractions  Mode: External Baseline Rate (A): 125 bpm Variability: Moderate Accelerations: 15 x 15 Decelerations: None  Contraction Frequency (min): x3  Impression: reactive  Recent Results (from the past 2160 hour(s))   Wet prep, genital     Status: Abnormal   Collection Time: 12/28/19 10:01 PM  Result Value Ref Range   Yeast Wet Prep HPF POC NONE SEEN NONE SEEN   Trich, Wet Prep NONE SEEN NONE SEEN   Clue Cells Wet Prep HPF POC PRESENT (A) NONE SEEN   WBC, Wet Prep HPF POC FEW (A) NONE SEEN   Sperm NONE SEEN     Comment: Performed at [facility], [address] [place], [place] [address]  Fetal fibronectin     Status: None   Collection Time: 12/28/19 10:01 PM  Result Value Ref Range   Fetal Fibronectin NEGATIVE NEGATIVE    Comment: Performed at [facility], [address] [place], [place] [address]    Plan: NST performed was reviewed and was found to be reactive. She was discharged home with bleeding/labor precautions.  Continue routine prenatal care. Follow up with CNM as previously scheduled.   Discharge Instructions: Per After Visit Summary.  Activity: Also refer to After Visit Summary.   Diet: Regular  Medications: Allergies as of 12/29/2019   No Known Allergies     Medication List    ASK your doctor about these medications   cyclobenzaprine 5 MG tablet Commonly known as: FLEXERIL Take 1 tablet (5 mg total) by mouth at bedtime.   hydrOXYzine 25 MG tablet Commonly known as: ATARAX/VISTARIL Take 1 tablet (25 mg total) by mouth every 6 (six) hours as needed for anxiety (contractions).   ondansetron 4 MG disintegrating tablet Commonly known as: Zofran ODT Take 1 tablet (4 mg total) by mouth every 8 (eight) hours as needed.      Outpatient  follow up: As previously scheduled or sooner if needed  Postpartum contraception: NEXPLANON  Discharged Condition: stable  Discharged to: home   Serafina Royals, CNM [facility], [facility]

## 2019-12-29 NOTE — Discharge Instructions (Signed)
LABOR: When contractions begin, you should start to time them from the beginning of one contraction to the beginning of the next.  When contractions are 5-10 minutes apart or less and have been regular for at least an hour, you should call your health care provider.  Notify your doctor if any of the following occur: 1. Bleeding from the vagina 7. Sudden, constant, or occasional abdominal pain  2. Pain or burning when urinating 8. Sudden gushing of fluid from the vagina (with or without continued leaking)  3. Chills or fever 9. Fainting spells, "black outs" or loss of consciousness  4. Increase in vaginal discharge 10. Severe or continued nausea or vomiting  5. Pelvic pressure (sudden increase) 11. Blurring of vision or spots before the eyes  6. Baby moving less than usual 12. Leaking of fluid    FETAL KICK COUNT: Lie on your left side for one hour after a meal, and count the number of times your baby kicks. If it is less than 5 times, get up, move around and drink some juice. Repeat the test 30 minutes later. If it is still less than 5 kicks in an hour, notify your doctor.  *Stay well hydrated!

## 2019-12-29 NOTE — OB Triage Note (Signed)
Pt discharged home in stable condition. RN provided discharge instructions to the patient, including information on when to come back. Pt verbalized understanding and all questions answered at this time.

## 2019-12-30 ENCOUNTER — Ambulatory Visit (INDEPENDENT_AMBULATORY_CARE_PROVIDER_SITE_OTHER): Payer: Medicaid Other | Admitting: Certified Nurse Midwife

## 2019-12-30 VITALS — BP 105/57 | HR 105 | Wt 113.2 lb

## 2019-12-30 DIAGNOSIS — Z3483 Encounter for supervision of other normal pregnancy, third trimester: Secondary | ICD-10-CM

## 2019-12-30 DIAGNOSIS — F5089 Other specified eating disorder: Secondary | ICD-10-CM

## 2019-12-30 DIAGNOSIS — O2613 Low weight gain in pregnancy, third trimester: Secondary | ICD-10-CM

## 2019-12-30 DIAGNOSIS — Z3A34 34 weeks gestation of pregnancy: Secondary | ICD-10-CM

## 2019-12-30 LAB — POCT URINALYSIS DIPSTICK OB
Bilirubin, UA: NEGATIVE
Blood, UA: NEGATIVE
Glucose, UA: NEGATIVE
Ketones, UA: NEGATIVE
Nitrite, UA: NEGATIVE
POC,PROTEIN,UA: NEGATIVE
Spec Grav, UA: 1.025 (ref 1.010–1.025)
Urobilinogen, UA: 0.2 E.U./dL
pH, UA: 6.5 (ref 5.0–8.0)

## 2019-12-30 NOTE — Progress Notes (Signed)
ROB- Pt feels well today with no complaints.

## 2019-12-30 NOTE — Patient Instructions (Signed)
Fetal Movement Counts Patient Name: ________________________________________________ Patient Due Date: ____________________ What is a fetal movement count?  A fetal movement count is the number of times that you feel your baby move during a certain amount of time. This may also be called a fetal kick count. A fetal movement count is recommended for every pregnant woman. You may be asked to start counting fetal movements as early as week 28 of your pregnancy. Pay attention to when your baby is most active. You may notice your baby's sleep and wake cycles. You may also notice things that make your baby move more. You should do a fetal movement count:  When your baby is normally most active.  At the same time each day. A good time to count movements is while you are resting, after having something to eat and drink. How do I count fetal movements? 1. Find a quiet, comfortable area. Sit, or lie down on your side. 2. Write down the date, the start time and stop time, and the number of movements that you felt between those two times. Take this information with you to your health care visits. 3. Write down your start time when you feel the first movement. 4. Count kicks, flutters, swishes, rolls, and jabs. You should feel at least 10 movements. 5. You may stop counting after you have felt 10 movements, or if you have been counting for 2 hours. Write down the stop time. 6. If you do not feel 10 movements in 2 hours, contact your health care provider for further instructions. Your health care provider may want to do additional tests to assess your baby's well-being. Contact a health care provider if:  You feel fewer than 10 movements in 2 hours.  Your baby is not moving like he or she usually does. Date: ____________ Start time: ____________ Stop time: ____________ Movements: ____________ Date: ____________ Start time: ____________ Stop time: ____________ Movements: ____________ Date: ____________  Start time: ____________ Stop time: ____________ Movements: ____________ Date: ____________ Start time: ____________ Stop time: ____________ Movements: ____________ Date: ____________ Start time: ____________ Stop time: ____________ Movements: ____________ Date: ____________ Start time: ____________ Stop time: ____________ Movements: ____________ Date: ____________ Start time: ____________ Stop time: ____________ Movements: ____________ Date: ____________ Start time: ____________ Stop time: ____________ Movements: ____________ Date: ____________ Start time: ____________ Stop time: ____________ Movements: ____________ This information is not intended to replace advice given to you by your health care provider. Make sure you discuss any questions you have with your health care provider. Document Revised: 01/06/2019 Document Reviewed: 01/06/2019 Elsevier Patient Education  2020 Elsevier Inc.  

## 2020-01-01 NOTE — Progress Notes (Signed)
ROB-Reports nausea this morning that resolved after dose of Zofran. Seen in Triage earlier this week, see note. Discussed home treatment measures for common discomforts of pregnancy. Anticipatory guidance regarding course of prenatal care. Reviewed red flag symptoms and when to call. RTC x 1 week for ROB. RTC x 2 weeks for growth ultrasound and ROB.

## 2020-01-03 ENCOUNTER — Other Ambulatory Visit: Payer: Self-pay

## 2020-01-03 ENCOUNTER — Encounter: Payer: Self-pay | Admitting: Obstetrics and Gynecology

## 2020-01-03 ENCOUNTER — Inpatient Hospital Stay
Admission: EM | Admit: 2020-01-03 | Discharge: 2020-01-03 | Disposition: A | Discharge status: Home / Self Care | Payer: Medicaid Other | Attending: Obstetrics and Gynecology | Admitting: Obstetrics and Gynecology

## 2020-01-03 DIAGNOSIS — O23593 Infection of other part of genital tract in pregnancy, third trimester: Secondary | ICD-10-CM | POA: Diagnosis not present

## 2020-01-03 DIAGNOSIS — M549 Dorsalgia, unspecified: Secondary | ICD-10-CM | POA: Insufficient documentation

## 2020-01-03 DIAGNOSIS — O4703 False labor before 37 completed weeks of gestation, third trimester: Secondary | ICD-10-CM | POA: Diagnosis not present

## 2020-01-03 DIAGNOSIS — O09213 Supervision of pregnancy with history of pre-term labor, third trimester: Secondary | ICD-10-CM | POA: Diagnosis not present

## 2020-01-03 DIAGNOSIS — O09899 Supervision of other high risk pregnancies, unspecified trimester: Secondary | ICD-10-CM

## 2020-01-03 DIAGNOSIS — R109 Unspecified abdominal pain: Secondary | ICD-10-CM | POA: Insufficient documentation

## 2020-01-03 DIAGNOSIS — Z3A35 35 weeks gestation of pregnancy: Secondary | ICD-10-CM | POA: Insufficient documentation

## 2020-01-03 DIAGNOSIS — O26893 Other specified pregnancy related conditions, third trimester: Secondary | ICD-10-CM | POA: Diagnosis not present

## 2020-01-03 DIAGNOSIS — O99013 Anemia complicating pregnancy, third trimester: Secondary | ICD-10-CM | POA: Insufficient documentation

## 2020-01-03 DIAGNOSIS — Z79899 Other long term (current) drug therapy: Secondary | ICD-10-CM | POA: Insufficient documentation

## 2020-01-03 DIAGNOSIS — O99891 Other specified diseases and conditions complicating pregnancy: Secondary | ICD-10-CM

## 2020-01-03 DIAGNOSIS — R102 Pelvic and perineal pain: Secondary | ICD-10-CM | POA: Diagnosis not present

## 2020-01-03 DIAGNOSIS — N760 Acute vaginitis: Secondary | ICD-10-CM

## 2020-01-03 DIAGNOSIS — D573 Sickle-cell trait: Secondary | ICD-10-CM | POA: Insufficient documentation

## 2020-01-03 LAB — RUPTURE OF MEMBRANE (ROM)PLUS: Rom Plus: NEGATIVE

## 2020-01-03 MED ORDER — HYDROXYZINE HCL 25 MG PO TABS
25.0000 mg | ORAL_TABLET | Freq: Once | ORAL | Status: AC
  Administered 2020-01-03: 25 mg via ORAL
  Filled 2020-01-03: qty 1

## 2020-01-03 MED ORDER — ACETAMINOPHEN 500 MG PO TABS
1000.0000 mg | ORAL_TABLET | Freq: Once | ORAL | Status: AC
  Administered 2020-01-03: 1000 mg via ORAL
  Filled 2020-01-03: qty 2

## 2020-01-03 NOTE — OB Triage Note (Signed)
Pt presents c/o LOF since around 1300 today. Pt states she had a big gush of clear fluid when she went to the restroom, and continue to leak after she got up from the toilet. Pt denies bleeding. Reports having irregular contractions. Reports minimal fetal movement. Pt reports having adequate food and liquid intake today. VSS. Will contine to monitor,

## 2020-01-03 NOTE — Discharge Summary (Signed)
RN reviewed discharge instructions with patient. Gave patient opportunity for questions. All questions answered at this time. Pt verbalized understanding. Pt discharged home. 

## 2020-01-06 ENCOUNTER — Encounter: Payer: Self-pay | Admitting: Certified Nurse Midwife

## 2020-01-06 ENCOUNTER — Ambulatory Visit (INDEPENDENT_AMBULATORY_CARE_PROVIDER_SITE_OTHER): Payer: Medicaid Other | Admitting: Certified Nurse Midwife

## 2020-01-06 VITALS — BP 86/61 | HR 82 | Wt 115.2 lb

## 2020-01-06 DIAGNOSIS — O99891 Other specified diseases and conditions complicating pregnancy: Secondary | ICD-10-CM

## 2020-01-06 DIAGNOSIS — Z3403 Encounter for supervision of normal first pregnancy, third trimester: Secondary | ICD-10-CM

## 2020-01-06 DIAGNOSIS — B9689 Other specified bacterial agents as the cause of diseases classified elsewhere: Secondary | ICD-10-CM

## 2020-01-06 DIAGNOSIS — N760 Acute vaginitis: Secondary | ICD-10-CM

## 2020-01-06 DIAGNOSIS — O23593 Infection of other part of genital tract in pregnancy, third trimester: Secondary | ICD-10-CM

## 2020-01-06 DIAGNOSIS — M549 Dorsalgia, unspecified: Secondary | ICD-10-CM

## 2020-01-06 DIAGNOSIS — Z3A35 35 weeks gestation of pregnancy: Secondary | ICD-10-CM

## 2020-01-06 LAB — POCT URINALYSIS DIPSTICK OB
Blood, UA: NEGATIVE
Glucose, UA: NEGATIVE
Ketones, UA: NEGATIVE
Leukocytes, UA: NEGATIVE
Nitrite, UA: NEGATIVE
POC,PROTEIN,UA: NEGATIVE
Spec Grav, UA: 1.025 (ref 1.010–1.025)
Urobilinogen, UA: 0.2 E.U./dL
pH, UA: 6 (ref 5.0–8.0)

## 2020-01-06 NOTE — Patient Instructions (Addendum)
Vaginitis  Vaginitis is irritation and swelling (inflammation) of the vagina. It happens when normal bacteria and yeast in the vagina grow too much. There are many types of this condition. Treatment will depend on the type you have. Follow these instructions at home: Lifestyle  Keep your vagina area clean and dry. ? Avoid using soap. ? Rinse the area with water.  Do not do the following until your doctor says it is okay: ? Wash and clean out the vagina (douche). ? Use tampons. ? Have sex.  Wipe from front to back after going to the bathroom.  Let air reach your vagina. ? Wear cotton underwear. ? Do not wear:  Underwear while you sleep.  Tight pants.  Thong underwear.  Underwear or nylons without a cotton panel. ? Take off any wet clothing, such as bathing suits, as soon as possible.  Use gentle, non-scented products. Do not use things that can irritate the vagina, such as fabric softeners. Avoid the following products if they are scented: ? Feminine sprays. ? Detergents. ? Tampons. ? Feminine hygiene products. ? Soaps or bubble baths.  Practice safe sex and use condoms. General instructions  Take over-the-counter and prescription medicines only as told by your doctor.  If you were prescribed an antibiotic medicine, take or use it as told by your doctor. Do not stop taking or using the antibiotic even if you start to feel better.  Keep all follow-up visits as told by your doctor. This is important. Contact a doctor if:  You have pain in your belly.  You have a fever.  Your symptoms last for more than 2-3 days. Get help right away if:  You have a fever and your symptoms get worse all of a sudden. Summary  Vaginitis is irritation and swelling of the vagina. It can happen when the normal bacteria and yeast in the vagina grow too much. There are many types.  Treatment will depend on the type you have.  Do not douche, use tampons , or have sex until your health  care provider approves. When you can return to sex, practice safe sex and use condoms. This information is not intended to replace advice given to you by your health care provider. Make sure you discuss any questions you have with your health care provider. Document Revised: 05/01/2017 Document Reviewed: 06/10/2016 Elsevier Patient Education  2020 [employer]   Fetal Movement Counts Patient Name: ________________________________________________ Patient Due Date: ____________________ What is a fetal movement count?  A fetal movement count is the number of times that you feel your baby move during a certain amount of time. This may also be called a fetal kick count. A fetal movement count is recommended for every pregnant woman. You may be asked to start counting fetal movements as early as week 28 of your pregnancy. Pay attention to when your baby is most active. You may notice your baby's sleep and wake cycles. You may also notice things that make your baby move more. You should do a fetal movement count:  When your baby is normally most active.  At the same time each day. A good time to count movements is while you are resting, after having something to eat and drink. How do I count fetal movements? 1. Find a quiet, comfortable area. Sit, or lie down on your side. 2. Write down the date, the start time and stop time, and the number of movements that you felt between those two times. Take this information with  you to your health care visits. 3. Write down your start time when you feel the first movement. 4. Count kicks, flutters, swishes, rolls, and jabs. You should feel at least 10 movements. 5. You may stop counting after you have felt 10 movements, or if you have been counting for 2 hours. Write down the stop time. 6. If you do not feel 10 movements in 2 hours, contact your health care provider for further instructions. Your health care provider may want to do additional tests to assess  your baby's well-being. Contact a health care provider if:  You feel fewer than 10 movements in 2 hours.  Your baby is not moving like he or she usually does. Date: ____________ Start time: ____________ Stop time: ____________ Movements: ____________ Date: ____________ Start time: ____________ Stop time: ____________ Movements: ____________ Date: ____________ Start time: ____________ Stop time: ____________ Movements: ____________ Date: ____________ Start time: ____________ Stop time: ____________ Movements: ____________ Date: ____________ Start time: ____________ Stop time: ____________ Movements: ____________ Date: ____________ Start time: ____________ Stop time: ____________ Movements: ____________ Date: ____________ Start time: ____________ Stop time: ____________ Movements: ____________ Date: ____________ Start time: ____________ Stop time: ____________ Movements: ____________ Date: ____________ Start time: ____________ Stop time: ____________ Movements: ____________ This information is not intended to replace advice given to you by your health care provider. Make sure you discuss any questions you have with your health care provider. Document Revised: 01/06/2019 Document Reviewed: 01/06/2019 Elsevier Patient Education  2020 [employer].

## 2020-01-06 NOTE — Progress Notes (Signed)
Pt present for routine prenatal visit. Pt is having back and lower abdominal pain/pressure.

## 2020-01-06 NOTE — Discharge Summary (Signed)
Obstetric Discharge Summary  Patient ID: Yolanda Garcia MRN: [id number] DOB/AGE: September 23, 1995 24 y.o.   Date of Admission: 01/03/2020  Date of Discharge: 01/03/2020  Admitting Diagnosis: Observation at [redacted]w[redacted]d  Secondary Diagnosis:   Patient Active Problem List   Diagnosis Date Noted  . Indication for care/intervention related to labor/delivery, antepartum 12/20/2019  . Decreased fetal movement 12/19/2019  . Pregnant 12/19/2019  . Indication for care in labor or delivery 12/11/2019  . History of preterm delivery 12/07/2019  . Iron deficiency anemia 11/25/2019  . Pica 11/19/2019  . Low weight gain during pregnancy in third trimester 11/18/2019  . Pregnancy 10/28/2019  . Abdominal pain affecting pregnancy   . Maternal varicella, non-immune 09/10/2019  . Second trimester bleeding 09/09/2019  . Late prenatal care 09/09/2019  . Short interval between pregnancies affecting pregnancy in second trimester, antepartum 09/09/2019  . Nausea/vomiting in pregnancy 01/25/2019  . Pelvic pressure in pregnancy 01/25/2019  . Anemia in pregnancy 01/05/2019  . Sickle cell trait (HCC) 08/23/2018       Discharge Diagnosis: No other diagnosis   Antepartum Procedures: NST   Brief Hospital Course   L&D OB Triage Note  Yolanda Garcia is a 24 y.o. G5P0101 female at 37 w5d, EDD Estimated Date of Delivery: 02/09/20 who presented to triage for complaints of leakage of fluid, back pain and abdominal pain.  She was evaluated by the nurses with no significant findings for preterm premature rupture of membranes (negative ROM plus and fern), preterm labor or fetal distress. Vital signs stable. An NST was performed and has been reviewed by CNM. She was treated with Tylenol, Vistaril, PO hydration and food.   NST INTERPRETATION: Indications: rule out uterine contractions  Mode: External Baseline Rate (A): 140 bpm (fht) Variability: Moderate Accelerations: 15 x 15 Decelerations: None Contraction Frequency  (min): occasional  Impression: reactive  Dilation: Fingertip Effacement (%): 50, 60 Cervical Position: Posterior Station: -3 Presentation: Vertex Exam by:: Laural Benes RN  Plan: NST performed was reviewed and was found to be reactive. She was discharged home with bleeding/labor precautions.  Continue routine prenatal care. Follow up with CNM as previously scheduled.   Discharge Instructions: Per After Visit Summary.  Activity: Advance as tolerated.  Also refer to After Visit Summary  Diet: Regular  Medications: Allergies as of 01/03/2020   No Known Allergies     Medication List    ASK your doctor about these medications   cyclobenzaprine 5 MG tablet Commonly known as: FLEXERIL Take 1 tablet (5 mg total) by mouth at bedtime.   hydrOXYzine 25 MG tablet Commonly known as: ATARAX/VISTARIL Take 1 tablet (25 mg total) by mouth every 6 (six) hours as needed for anxiety (contractions).   ondansetron 4 MG disintegrating tablet Commonly known as: Zofran ODT Take 1 tablet (4 mg total) by mouth every 8 (eight) hours as needed.      Outpatient follow up: With CNM as previously scheduled or sooner if needed  Postpartum contraception: Nexplanon  Discharged Condition: stable  Discharged to: home   Serafina Royals, CNM  [facility], [facility]

## 2020-01-06 NOTE — Progress Notes (Signed)
ROB-Reports intermittent back pain, pelvic pain and increased vaginal discharge. Seen in OB Triage with negative ROM plus and Fern, positive clue cells. Nuvessa sample given today for treatment of BV. Speculum exam with mutlip os, negative pooling. Encouraged home treatment measures including warm baths with epsom salt. Anticipatory guidance regarding course of prenatal care. Reviewed red flag symptoms and when to call. RTC x 1 week for ROB or sooner if needed.

## 2020-01-07 ENCOUNTER — Other Ambulatory Visit: Payer: Self-pay

## 2020-01-07 ENCOUNTER — Observation Stay
Admission: EM | Admit: 2020-01-07 | Discharge: 2020-01-07 | Disposition: A | Discharge status: Home / Self Care | Payer: Medicaid Other | Attending: Obstetrics and Gynecology | Admitting: Obstetrics and Gynecology

## 2020-01-07 ENCOUNTER — Encounter: Payer: Self-pay | Admitting: Obstetrics and Gynecology

## 2020-01-07 DIAGNOSIS — E860 Dehydration: Secondary | ICD-10-CM

## 2020-01-07 DIAGNOSIS — M545 Low back pain: Secondary | ICD-10-CM | POA: Diagnosis not present

## 2020-01-07 DIAGNOSIS — O99280 Endocrine, nutritional and metabolic diseases complicating pregnancy, unspecified trimester: Secondary | ICD-10-CM

## 2020-01-07 DIAGNOSIS — O479 False labor, unspecified: Secondary | ICD-10-CM

## 2020-01-07 DIAGNOSIS — Z3A35 35 weeks gestation of pregnancy: Secondary | ICD-10-CM | POA: Insufficient documentation

## 2020-01-07 DIAGNOSIS — O4703 False labor before 37 completed weeks of gestation, third trimester: Secondary | ICD-10-CM

## 2020-01-07 DIAGNOSIS — O99891 Other specified diseases and conditions complicating pregnancy: Secondary | ICD-10-CM | POA: Diagnosis present

## 2020-01-07 DIAGNOSIS — O26899 Other specified pregnancy related conditions, unspecified trimester: Secondary | ICD-10-CM | POA: Diagnosis not present

## 2020-01-07 DIAGNOSIS — O212 Late vomiting of pregnancy: Secondary | ICD-10-CM

## 2020-01-07 LAB — RUPTURE OF MEMBRANE (ROM)PLUS: Rom Plus: NEGATIVE

## 2020-01-07 MED ORDER — LACTATED RINGERS IV SOLN: INTRAVENOUS | Status: DC

## 2020-01-07 MED ORDER — LIDOCAINE 5 % EX PTCH: 1.0000 | MEDICATED_PATCH | Freq: Once | CUTANEOUS | Status: DC

## 2020-01-07 MED ORDER — ACETAMINOPHEN 500 MG PO TABS
1000.0000 mg | ORAL_TABLET | Freq: Once | ORAL | Status: AC
  Administered 2020-01-07: 1000 mg via ORAL
  Filled 2020-01-07: qty 2

## 2020-01-07 MED ORDER — LACTATED RINGERS IV BOLUS
1000.0000 mL | Freq: Once | INTRAVENOUS | Status: AC
  Administered 2020-01-07: 1000 mL via INTRAVENOUS

## 2020-01-07 MED ORDER — LIDOCAINE 5 % EX PTCH
MEDICATED_PATCH | CUTANEOUS | Status: AC
  Administered 2020-01-07: 1 via TRANSDERMAL
  Filled 2020-01-07: qty 1

## 2020-01-07 MED ORDER — HYDROXYZINE HCL 50 MG/ML IM SOLN
25.0000 mg | Freq: Once | INTRAMUSCULAR | Status: AC
  Administered 2020-01-07: 25 mg via INTRAMUSCULAR
  Filled 2020-01-07: qty 0.5

## 2020-01-07 MED ORDER — ONDANSETRON HCL 4 MG/2ML IJ SOLN: 4.0000 mg | Freq: Once | INTRAMUSCULAR | Status: AC

## 2020-01-07 MED ORDER — HYDROXYZINE HCL 25 MG PO TABS
25.0000 mg | ORAL_TABLET | Freq: Once | ORAL | Status: AC
  Administered 2020-01-07: 25 mg via ORAL
  Filled 2020-01-07: qty 1

## 2020-01-07 MED ORDER — ONDANSETRON HCL 4 MG/2ML IJ SOLN
INTRAMUSCULAR | Status: AC
  Administered 2020-01-07: 4 mg via INTRAVENOUS
  Filled 2020-01-07: qty 2

## 2020-01-07 NOTE — OB Triage Note (Signed)
CNM notified that patient is feeling better and ready to go home. Verbal orders given to D/C patient home. Pt instructed to follow up at her next scheduled OB appointment. Pt instructed to return to hospital for signs of labor. Pt verbalized understanding of instructions and has no further questions at this time. Pt discharged home in stable condition.

## 2020-01-07 NOTE — OB Triage Note (Signed)
Pt presents for lower abd/CTX since yesterday. Pt reports pain is 8/10 and is every few minutes. Reports being prescribed med for BV yesterday but has not taken due to not feeling well. denies LOF/ bleeding.  Hx preterm delivery. VSS

## 2020-01-09 DIAGNOSIS — O99280 Endocrine, nutritional and metabolic diseases complicating pregnancy, unspecified trimester: Secondary | ICD-10-CM

## 2020-01-09 DIAGNOSIS — E860 Dehydration: Secondary | ICD-10-CM

## 2020-01-09 DIAGNOSIS — O479 False labor, unspecified: Secondary | ICD-10-CM

## 2020-01-09 NOTE — Discharge Instructions (Signed)
Braxton Hicks Contractions °Contractions of the uterus can occur throughout pregnancy, but they are not always a sign that you are in labor. You may have practice contractions called Braxton Hicks contractions. These false labor contractions are sometimes confused with true labor. °What are Braxton Hicks contractions? °Braxton Hicks contractions are tightening movements that occur in the muscles of the uterus before labor. Unlike true labor contractions, these contractions do not result in opening (dilation) and thinning of the cervix. Toward the end of pregnancy (32-34 weeks), Braxton Hicks contractions can happen more often and may become stronger. These contractions are sometimes difficult to tell apart from true labor because they can be very uncomfortable. You should not feel embarrassed if you go to the hospital with false labor. °Sometimes, the only way to tell if you are in true labor is for your health care provider to look for changes in the cervix. The health care provider will do a physical exam and may monitor your contractions. If you are not in true labor, the exam should show that your cervix is not dilating and your water has not broken. °If there are no other health problems associated with your pregnancy, it is completely safe for you to be sent home with false labor. You may continue to have Braxton Hicks contractions until you go into true labor. °How to tell the difference between true labor and false labor °True labor °· Contractions last 30-70 seconds. °· Contractions become very regular. °· Discomfort is usually felt in the top of the uterus, and it spreads to the lower abdomen and low back. °· Contractions do not go away with walking. °· Contractions usually become more intense and increase in frequency. °· The cervix dilates and gets thinner. °False labor °· Contractions are usually shorter and not as strong as true labor contractions. °· Contractions are usually irregular. °· Contractions  are often felt in the front of the lower abdomen and in the groin. °· Contractions may go away when you walk around or change positions while lying down. °· Contractions get weaker and are shorter-lasting as time goes on. °· The cervix usually does not dilate or become thin. °Follow these instructions at home: ° °· Take over-the-counter and prescription medicines only as told by your health care provider. °· Keep up with your usual exercises and follow other instructions from your health care provider. °· Eat and drink lightly if you think you are going into labor. °· If Braxton Hicks contractions are making you uncomfortable: °? Change your position from lying down or resting to walking, or change from walking to resting. °? Sit and rest in a tub of warm water. °? Drink enough fluid to keep your urine pale yellow. Dehydration may cause these contractions. °? Do slow and deep breathing several times an hour. °· Keep all follow-up prenatal visits as told by your health care provider. This is important. °Contact a health care provider if: °· You have a fever. °· You have continuous pain in your abdomen. °Get help right away if: °· Your contractions become stronger, more regular, and closer together. °· You have fluid leaking or gushing from your vagina. °· You pass blood-tinged mucus (bloody show). °· You have bleeding from your vagina. °· You have low back pain that you never had before. °· You feel your baby’s head pushing down and causing pelvic pressure. °· Your baby is not moving inside you as much as it used to. °Summary °· Contractions that occur before labor are   called Braxton Hicks contractions, false labor, or practice contractions. °· Braxton Hicks contractions are usually shorter, weaker, farther apart, and less regular than true labor contractions. True labor contractions usually become progressively stronger and regular, and they become more frequent. °· Manage discomfort from Braxton Hicks contractions  by changing position, resting in a warm bath, drinking plenty of water, or practicing deep breathing. °This information is not intended to replace advice given to you by your health care provider. Make sure you discuss any questions you have with your health care provider. °Document Revised: 05/01/2017 Document Reviewed: 10/02/2016 °Elsevier Patient Education © 2020 Elsevier Inc. ° °

## 2020-01-09 NOTE — Discharge Summary (Signed)
Obstetric Discharge Summary  Patient ID: Yolanda Garcia MRN: [id number] DOB/AGE: 1996-02-14 24 y.o.   Date of Admission: 01/07/2020  Date of Discharge: 01/07/2020  Admitting Diagnosis: Observation at [redacted]w[redacted]d  Secondary Diagnosis:  Patient Active Problem List   Diagnosis Date Noted  . Braxton Hick's contraction   . Dehydration during pregnancy   . Indication for care in labor and delivery, antepartum 01/07/2020  . Back pain affecting pregnancy in third trimester   . Vaginitis during pregnancy in third trimester   . Decreased fetal movement 12/19/2019  . Pregnant 12/19/2019  . Indication for care in labor or delivery 12/11/2019  . History of preterm delivery 12/07/2019  . Iron deficiency anemia 11/25/2019  . Pica 11/19/2019  . Low weight gain during pregnancy in third trimester 11/18/2019  . Pregnancy 10/28/2019  . Abdominal pain affecting pregnancy   . Maternal varicella, non-immune 09/10/2019  . Second trimester bleeding 09/09/2019  . Late prenatal care 09/09/2019  . Short interval between pregnancies affecting pregnancy in second trimester, antepartum 09/09/2019  . Nausea/vomiting in pregnancy 01/25/2019  . Pelvic pain affecting pregnancy in third trimester, antepartum 01/25/2019  . Anemia in pregnancy 01/05/2019  . Sickle cell trait (HCC) 08/23/2018       Discharge Diagnosis: No other diagnosis   Antepartum Procedures: NST, food/hydration, and Medications-see [facility]    Brief Hospital Course   L&D OB Triage Note  Yolanda Garcia is a 24 y.o. G2P0101 female at [redacted]w[redacted]d, EDD Estimated Date of Delivery: 02/09/20 who presented to triage for complaints of lower back pain and contractions.  She was evaluated by the nurses with no significant findings for preterm premature rupture of membranes, preterm labor or fetal distress. Vital signs stable. An NST was performed and has been reviewed by CNM. She was treated with Vistaril, Tylenol, and food/oral hydration.   NST  INTERPRETATION:  Indications: rule out uterine contractions  Mode: External Baseline Rate (A): 130 bpm Variability: Moderate Accelerations: 15 x 15 Decelerations: None Contraction Frequency (min): 4-8 irregular  Impression: reactive  Dilation: Fingertip (pt did not tolerate to get better exam) Exam by:: JKL  Plan: NST performed was reviewed and was found to be reactive. She was discharged home with bleeding/labor precautions.  Continue routine prenatal care. Follow up with CNM as previously scheduled.   Discharge Instructions: Per After Visit Summary.  Activity: Advance as tolerated. Pelvic rest for 6 weeks.  Also refer to After Visit Summary  Diet: Regular  Medications: Allergies as of 01/07/2020   No Known Allergies     Medication List    ASK your doctor about these medications   cyclobenzaprine 5 MG tablet Commonly known as: FLEXERIL Take 1 tablet (5 mg total) by mouth at bedtime.   hydrOXYzine 25 MG tablet Commonly known as: ATARAX/VISTARIL Take 1 tablet (25 mg total) by mouth every 6 (six) hours as needed for anxiety (contractions).   ondansetron 4 MG disintegrating tablet Commonly known as: Zofran ODT Take 1 tablet (4 mg total) by mouth every 8 (eight) hours as needed.      Outpatient follow up:   Follow-up Information    Gunnar Bulla, CNM Follow up.   Specialties: Certified Nurse Midwife, Obstetrics and Gynecology, Radiology Why: As previously scheduled or sooner if needed Contact information: [address] [place] [address] [phone]              Postpartum contraception: Nexplanon  Discharged Condition: stable  Discharged to: home   Yolanda Garcia, CNM  [facility], [facility]

## 2020-01-12 ENCOUNTER — Inpatient Hospital Stay: Payer: Medicaid Other | Admitting: Oncology

## 2020-01-12 ENCOUNTER — Ambulatory Visit (INDEPENDENT_AMBULATORY_CARE_PROVIDER_SITE_OTHER): Payer: Medicaid Other | Admitting: Certified Nurse Midwife

## 2020-01-12 ENCOUNTER — Encounter: Payer: Self-pay | Admitting: Certified Nurse Midwife

## 2020-01-12 ENCOUNTER — Other Ambulatory Visit: Payer: Self-pay

## 2020-01-12 ENCOUNTER — Inpatient Hospital Stay: Payer: Medicaid Other

## 2020-01-12 VITALS — BP 100/43 | HR 74 | Wt 116.0 lb

## 2020-01-12 DIAGNOSIS — Z3685 Encounter for antenatal screening for Streptococcus B: Secondary | ICD-10-CM

## 2020-01-12 DIAGNOSIS — Z113 Encounter for screening for infections with a predominantly sexual mode of transmission: Secondary | ICD-10-CM

## 2020-01-12 DIAGNOSIS — Z3A36 36 weeks gestation of pregnancy: Secondary | ICD-10-CM

## 2020-01-12 DIAGNOSIS — Z3403 Encounter for supervision of normal first pregnancy, third trimester: Secondary | ICD-10-CM

## 2020-01-12 LAB — POCT URINALYSIS DIPSTICK OB
Bilirubin, UA: NEGATIVE
Blood, UA: NEGATIVE
Glucose, UA: NEGATIVE
Ketones, UA: NEGATIVE
Nitrite, UA: NEGATIVE
POC,PROTEIN,UA: NEGATIVE
Spec Grav, UA: 1.01 (ref 1.010–1.025)
Urobilinogen, UA: 0.2 E.U./dL
pH, UA: 7 (ref 5.0–8.0)

## 2020-01-12 NOTE — Patient Instructions (Addendum)
Etonogestrel implant What is this medicine? ETONOGESTREL (et oh noe JES trel) is a contraceptive (birth control) device. It is used to prevent pregnancy. It can be used for up to 3 years. This medicine may be used for other purposes; ask your health care provider or pharmacist if you have questions. COMMON BRAND NAME(S): Implanon, Nexplanon What should I tell my health care provider before I take this medicine? They need to know if you have any of these conditions:  abnormal vaginal bleeding  blood vessel disease or blood clots  breast, cervical, endometrial, ovarian, liver, or uterine cancer  diabetes  gallbladder disease  heart disease or recent heart attack  high blood pressure  high cholesterol or triglycerides  kidney disease  liver disease  migraine headaches  seizures  stroke  tobacco smoker  an unusual or allergic reaction to etonogestrel, anesthetics or antiseptics, other medicines, foods, dyes, or preservatives  pregnant or trying to get pregnant  breast-feeding How should I use this medicine? This device is inserted just under the skin on the inner side of your upper arm by a health care professional. Talk to your pediatrician regarding the use of this medicine in children. Special care may be needed. Overdosage: If you think you have taken too much of this medicine contact a poison control center or emergency room at once. NOTE: This medicine is only for you. Do not share this medicine with others. What if I miss a dose? This does not apply. What may interact with this medicine? Do not take this medicine with any of the following medications:  amprenavir  fosamprenavir This medicine may also interact with the following medications:  acitretin  aprepitant  armodafinil  bexarotene  bosentan  carbamazepine  certain medicines for fungal infections like fluconazole, ketoconazole, itraconazole and voriconazole  certain medicines to treat  hepatitis, HIV or AIDS  cyclosporine  felbamate  griseofulvin  lamotrigine  modafinil  oxcarbazepine  phenobarbital  phenytoin  primidone  rifabutin  rifampin  rifapentine  St. John's wort  topiramate This list may not describe all possible interactions. Give your health care provider a list of all the medicines, herbs, non-prescription drugs, or dietary supplements you use. Also tell them if you smoke, drink alcohol, or use illegal drugs. Some items may interact with your medicine. What should I watch for while using this medicine? This product does not protect you against HIV infection (AIDS) or other sexually transmitted diseases. You should be able to feel the implant by pressing your fingertips over the skin where it was inserted. Contact your doctor if you cannot feel the implant, and use a non-hormonal birth control method (such as condoms) until your doctor confirms that the implant is in place. Contact your doctor if you think that the implant may have broken or become bent while in your arm. You will receive a user card from your health care provider after the implant is inserted. The card is a record of the location of the implant in your upper arm and when it should be removed. Keep this card with your health records. What side effects may I notice from receiving this medicine? Side effects that you should report to your doctor or health care professional as soon as possible:  allergic reactions like skin rash, itching or hives, swelling of the face, lips, or tongue  breast lumps, breast tissue changes, or discharge  breathing problems  changes in emotions or moods  coughing up blood  if you feel that the implant   may have broken or bent while in your arm  high blood pressure  pain, irritation, swelling, or bruising at the insertion site  scar at site of insertion  signs of infection at the insertion site such as fever, and skin redness, pain or  discharge  signs and symptoms of a blood clot such as breathing problems; changes in vision; chest pain; severe, sudden headache; pain, swelling, warmth in the leg; trouble speaking; sudden numbness or weakness of the face, arm or leg  signs and symptoms of liver injury like dark yellow or brown urine; general ill feeling or flu-like symptoms; light-colored stools; loss of appetite; nausea; right upper belly pain; unusually weak or tired; yellowing of the eyes or skin  unusual vaginal bleeding, discharge Side effects that usually do not require medical attention (report to your doctor or health care professional if they continue or are bothersome):  acne  breast pain or tenderness  headache  irregular menstrual bleeding  nausea This list may not describe all possible side effects. Call your doctor for medical advice about side effects. You may report side effects to FDA at 1-800-FDA-1088. Where should I keep my medicine? This drug is given in a hospital or clinic and will not be stored at home. NOTE: This sheet is a summary. It may not cover all possible information. If you have questions about this medicine, talk to your doctor, pharmacist, or health care provider.  2020 Elsevier/Gold Standard (2019-03-01 11:33:04)   Vaginal Delivery  Vaginal delivery means that you give birth by pushing your baby out of your birth canal (vagina). A team of health care providers will help you before, during, and after vaginal delivery. Birth experiences are unique for every woman and every pregnancy, and birth experiences vary depending on where you choose to give birth. What happens when I arrive at the birth center or hospital? Once you are in labor and have been admitted into the hospital or birth center, your health care provider may:  Review your pregnancy history and any concerns that you have.  Insert an IV into one of your veins. This may be used to give you fluids and medicines.  Check  your blood pressure, pulse, temperature, and heart rate (vital signs).  Check whether your bag of water (amniotic sac) has broken (ruptured).  Talk with you about your birth plan and discuss pain control options. Monitoring Your health care provider may monitor your contractions (uterine monitoring) and your baby's heart rate (fetal monitoring). You may need to be monitored:  Often, but not continuously (intermittently).  All the time or for long periods at a time (continuously). Continuous monitoring may be needed if: ? You are taking certain medicines, such as medicine to relieve pain or make your contractions stronger. ? You have pregnancy or labor complications. Monitoring may be done by:  Placing a special stethoscope or a handheld monitoring device on your abdomen to check your baby's heartbeat and to check for contractions.  Placing monitors on your abdomen (external monitors) to record your baby's heartbeat and the frequency and length of contractions.  Placing monitors inside your uterus through your vagina (internal monitors) to record your baby's heartbeat and the frequency, length, and strength of your contractions. Depending on the type of monitor, it may remain in your uterus or on your baby's head until birth.  Telemetry. This is a type of continuous monitoring that can be done with external or internal monitors. Instead of having to stay in bed, you are  able to move around during telemetry. Physical exam Your health care provider may perform frequent physical exams. This may include:  Checking how and where your baby is positioned in your uterus.  Checking your cervix to determine: ? Whether it is thinning out (effacing). ? Whether it is opening up (dilating). What happens during labor and delivery?  Normal labor and delivery is divided into the following three stages: Stage 1  This is the longest stage of labor.  This stage can last for hours or  days.  Throughout this stage, you will feel contractions. Contractions generally feel mild, infrequent, and irregular at first. They get stronger, more frequent (about every 2-3 minutes), and more regular as you move through this stage.  This stage ends when your cervix is completely dilated to 4 inches (10 cm) and completely effaced. Stage 2  This stage starts once your cervix is completely effaced and dilated and lasts until the delivery of your baby.  This stage may last from 20 minutes to 2 hours.  This is the stage where you will feel an urge to push your baby out of your vagina.  You may feel stretching and burning pain, especially when the widest part of your baby's head passes through the vaginal opening (crowning).  Once your baby is delivered, the umbilical cord will be clamped and cut. This usually occurs after waiting a period of 1-2 minutes after delivery.  Your baby will be placed on your bare chest (skin-to-skin contact) in an upright position and covered with a warm blanket. Watch your baby for feeding cues, like rooting or sucking, and help the baby to your breast for his or her first feeding. Stage 3  This stage starts immediately after the birth of your baby and ends after you deliver the placenta.  This stage may take anywhere from 5 to 30 minutes.  After your baby has been delivered, you will feel contractions as your body expels the placenta and your uterus contracts to control bleeding. What can I expect after labor and delivery?  After labor is over, you and your baby will be monitored closely until you are ready to go home to ensure that you are both healthy. Your health care team will teach you how to care for yourself and your baby.  You and your baby will stay in the same room (rooming in) during your hospital stay. This will encourage early bonding and successful breastfeeding.  You may continue to receive fluids and medicines through an IV.  Your uterus  will be checked and massaged regularly (fundal massage).  You will have some soreness and pain in your abdomen, vagina, and the area of skin between your vaginal opening and your anus (perineum).  If an incision was made near your vagina (episiotomy) or if you had some vaginal tearing during delivery, cold compresses may be placed on your episiotomy or your tear. This helps to reduce pain and swelling.  You may be given a squirt bottle to use instead of wiping when you go to the bathroom. To use the squirt bottle, follow these steps: ? Before you urinate, fill the squirt bottle with warm water. Do not use hot water. ? After you urinate, while you are sitting on the toilet, use the squirt bottle to rinse the area around your urethra and vaginal opening. This rinses away any urine and blood. ? Fill the squirt bottle with clean water every time you use the bathroom.  It is normal to have  vaginal bleeding after delivery. Wear a sanitary pad for vaginal bleeding and discharge. Summary  Vaginal delivery means that you will give birth by pushing your baby out of your birth canal (vagina).  Your health care provider may monitor your contractions (uterine monitoring) and your baby's heart rate (fetal monitoring).  Your health care provider may perform a physical exam.  Normal labor and delivery is divided into three stages.  After labor is over, you and your baby will be monitored closely until you are ready to go home. This information is not intended to replace advice given to you by your health care provider. Make sure you discuss any questions you have with your health care provider. Document Revised: 06/23/2017 Document Reviewed: 06/23/2017 Elsevier Patient Education  2020 Elsevier Inc.   Fetal Movement Counts Patient Name: ________________________________________________ Patient Due Date: ____________________ What is a fetal movement count?  A fetal movement count is the number of times  that you feel your baby move during a certain amount of time. This may also be called a fetal kick count. A fetal movement count is recommended for every pregnant woman. You may be asked to start counting fetal movements as early as week 28 of your pregnancy. Pay attention to when your baby is most active. You may notice your baby's sleep and wake cycles. You may also notice things that make your baby move more. You should do a fetal movement count:  When your baby is normally most active.  At the same time each day. A good time to count movements is while you are resting, after having something to eat and drink. How do I count fetal movements? 1. Find a quiet, comfortable area. Sit, or lie down on your side. 2. Write down the date, the start time and stop time, and the number of movements that you felt between those two times. Take this information with you to your health care visits. 3. Write down your start time when you feel the first movement. 4. Count kicks, flutters, swishes, rolls, and jabs. You should feel at least 10 movements. 5. You may stop counting after you have felt 10 movements, or if you have been counting for 2 hours. Write down the stop time. 6. If you do not feel 10 movements in 2 hours, contact your health care provider for further instructions. Your health care provider may want to do additional tests to assess your baby's well-being. Contact a health care provider if:  You feel fewer than 10 movements in 2 hours.  Your baby is not moving like he or she usually does. Date: ____________ Start time: ____________ Stop time: ____________ Movements: ____________ Date: ____________ Start time: ____________ Stop time: ____________ Movements: ____________ Date: ____________ Start time: ____________ Stop time: ____________ Movements: ____________ Date: ____________ Start time: ____________ Stop time: ____________ Movements: ____________ Date: ____________ Start time: ____________  Stop time: ____________ Movements: ____________ Date: ____________ Start time: ____________ Stop time: ____________ Movements: ____________ Date: ____________ Start time: ____________ Stop time: ____________ Movements: ____________ Date: ____________ Start time: ____________ Stop time: ____________ Movements: ____________ Date: ____________ Start time: ____________ Stop time: ____________ Movements: ____________ This information is not intended to replace advice given to you by your health care provider. Make sure you discuss any questions you have with your health care provider. Document Revised: 01/06/2019 Document Reviewed: 01/06/2019 Elsevier Patient Education  2020 [employer].

## 2020-01-12 NOTE — Addendum Note (Signed)
Addended by: Blair Heys on: 01/12/2020 03:45 PM   Modules accepted: Orders

## 2020-01-12 NOTE — Progress Notes (Signed)
ROB-Doing well, has plans for birthday tomorrow. Completing BV treatment tonight. 36 week cultures recollected. SVE deferred due to history of preterm birth. Anticipatory guidance regarding course of prenatal care. Reviewed red flag symptoms and when to call. RTC x 1 week for growth ultrasound and ROB or sooner if needed.

## 2020-01-14 LAB — GC/CHLAMYDIA PROBE AMP
Chlamydia trachomatis, NAA: NEGATIVE
Neisseria Gonorrhoeae by PCR: NEGATIVE

## 2020-01-14 LAB — STREP GP B NAA: Strep Gp B NAA: NEGATIVE

## 2020-01-17 ENCOUNTER — Telehealth: Payer: Self-pay | Admitting: Certified Nurse Midwife

## 2020-01-17 ENCOUNTER — Inpatient Hospital Stay: Payer: Medicaid Other

## 2020-01-17 ENCOUNTER — Observation Stay
Admission: EM | Admit: 2020-01-17 | Discharge: 2020-01-17 | Disposition: A | Discharge status: Home / Self Care | Payer: Medicaid Other | Attending: Obstetrics and Gynecology | Admitting: Obstetrics and Gynecology

## 2020-01-17 ENCOUNTER — Telehealth: Payer: Self-pay

## 2020-01-17 ENCOUNTER — Inpatient Hospital Stay: Payer: Medicaid Other | Admitting: Oncology

## 2020-01-17 DIAGNOSIS — N898 Other specified noninflammatory disorders of vagina: Secondary | ICD-10-CM

## 2020-01-17 DIAGNOSIS — O4193X0 Disorder of amniotic fluid and membranes, unspecified, third trimester, not applicable or unspecified: Secondary | ICD-10-CM | POA: Insufficient documentation

## 2020-01-17 DIAGNOSIS — O368130 Decreased fetal movements, third trimester, not applicable or unspecified: Principal | ICD-10-CM | POA: Insufficient documentation

## 2020-01-17 DIAGNOSIS — Z3A36 36 weeks gestation of pregnancy: Secondary | ICD-10-CM | POA: Insufficient documentation

## 2020-01-17 DIAGNOSIS — O26893 Other specified pregnancy related conditions, third trimester: Secondary | ICD-10-CM

## 2020-01-17 DIAGNOSIS — O471 False labor at or after 37 completed weeks of gestation: Secondary | ICD-10-CM | POA: Insufficient documentation

## 2020-01-17 LAB — RUPTURE OF MEMBRANE (ROM)PLUS: Rom Plus: NEGATIVE

## 2020-01-17 MED ORDER — HYDROXYZINE HCL 25 MG PO TABS
25.0000 mg | ORAL_TABLET | Freq: Once | ORAL | Status: DC
  Filled 2020-01-17: qty 1

## 2020-01-17 MED ORDER — ACETAMINOPHEN 500 MG PO TABS
ORAL_TABLET | ORAL | Status: AC
  Filled 2020-01-17: qty 2

## 2020-01-17 MED ORDER — LIDOCAINE 5 % EX PTCH
MEDICATED_PATCH | CUTANEOUS | Status: AC
  Filled 2020-01-17: qty 1

## 2020-01-17 MED ORDER — LIDOCAINE 5 % EX PTCH: 1.0000 | MEDICATED_PATCH | CUTANEOUS | Status: DC

## 2020-01-17 MED ORDER — ACETAMINOPHEN 500 MG PO TABS: 1000.0000 mg | ORAL_TABLET | Freq: Once | ORAL | Status: DC

## 2020-01-17 NOTE — Progress Notes (Signed)
CNM Lawhorn gave verbal order for Tylenol, Atarax, and lidoderm patch. RN then informed Patient of negative ROM+ test and that she was being discharged home. Pt became upset with RN and stated "I'm not taking those medicines. I have that at home and she's not doing anything else for me. I had my last baby at 36 weeks and he was fine." Pt given discharge instructions and instructed when to return to the hospital for evaluation and has no further questions at this time. Pt discharged home in stable condition and instructed to follow up at her next OB appointment on 8/19.

## 2020-01-17 NOTE — Telephone Encounter (Signed)
Patient called in stating that she hasn't felt her baby move since yesterday night. Patient is concerned that there is something wrong. Informed her that I would send a message to her providers nurse. Patient is aware her provider is out of the office until Thursday. Could you please advise?

## 2020-01-17 NOTE — Telephone Encounter (Signed)
Mychart message sent to patient.

## 2020-01-17 NOTE — OB Triage Note (Signed)
Pt is a 24yo G2P1 at [redacted]w[redacted]d that presents from ED with c/o of LOF and Decreased fetal movement. Pt states she had a big gush of fluid between 2pm and 3pm. Pt states "I went to the bathroom and I had a big gush then it kept trickling after I used the bathroom and my pad is wet." Pt states she has not felt her baby move since yesterday morning. Pt states she tried drinking Iced tea and water but still hasn't felt baby movement. Vital WDL and EFM applied initial FHT 135.

## 2020-01-17 NOTE — Telephone Encounter (Signed)
Called patient in regards to decreased fetal movement. Pt stated she had sweet tea to drink. I informed patient to snack on something sweet and gently press on belly per Doreene Burke, CNM to encourage fetal movement. Patient stated there is no bleeding/spotting. Patient aware that if fetal movement is not present after 30 minutes she will need to go to the ED. I asked patient to correspond via mychart.

## 2020-01-19 ENCOUNTER — Ambulatory Visit (INDEPENDENT_AMBULATORY_CARE_PROVIDER_SITE_OTHER): Payer: Medicaid Other

## 2020-01-19 ENCOUNTER — Other Ambulatory Visit: Payer: Self-pay

## 2020-01-19 ENCOUNTER — Ambulatory Visit (INDEPENDENT_AMBULATORY_CARE_PROVIDER_SITE_OTHER): Payer: Medicaid Other | Admitting: Certified Nurse Midwife

## 2020-01-19 VITALS — BP 94/61 | HR 101 | Wt 117.1 lb

## 2020-01-19 DIAGNOSIS — O2613 Low weight gain in pregnancy, third trimester: Secondary | ICD-10-CM | POA: Diagnosis not present

## 2020-01-19 DIAGNOSIS — Z3483 Encounter for supervision of other normal pregnancy, third trimester: Secondary | ICD-10-CM

## 2020-01-19 DIAGNOSIS — Z3A34 34 weeks gestation of pregnancy: Secondary | ICD-10-CM

## 2020-01-19 DIAGNOSIS — F5089 Other specified eating disorder: Secondary | ICD-10-CM

## 2020-01-19 DIAGNOSIS — Z3A37 37 weeks gestation of pregnancy: Secondary | ICD-10-CM

## 2020-01-19 LAB — POCT URINALYSIS DIPSTICK OB
Bilirubin, UA: NEGATIVE
Blood, UA: NEGATIVE
Glucose, UA: NEGATIVE
Ketones, UA: NEGATIVE
Nitrite, UA: NEGATIVE
POC,PROTEIN,UA: NEGATIVE
Spec Grav, UA: 1.01 (ref 1.010–1.025)
Urobilinogen, UA: 0.2 E.U./dL
pH, UA: 6.5 (ref 5.0–8.0)

## 2020-01-19 NOTE — Patient Instructions (Signed)

## 2020-01-19 NOTE — Progress Notes (Signed)
ROB-Reports increased pelvic pressure. Discussed home labor management techniques. Birth affirmations, herbal prep guide and pre-labor checklist provided. Growth and AFI wnl, see below. Anticipatory guidance regarding course of prenatal care. Reviewed red flag symptoms and when to call. RTC x 1 week for ROB or sooner if needed.   ULTRASOUND REPORT  Location: [facility] Date of Service: 01/19/2020   Indications:growth/afi   Findings:  Mason Jim intrauterine pregnancy is visualized with FHR at 128 BPM. Biometrics give an (U/S) Gestational age of [redacted]w[redacted]d and an (U/S) EDD of 02/12/2020; this correlates with the clinically established Estimated Date of Delivery: 02/09/20.  Fetal presentation is Cephalic.  Placenta: anterior. Grade: 2 AFI: 17.5 cm  Growth percentile is 37. EFW: 2871 g ( 6 lbs 5 0z)   Impression: 1. [redacted]w[redacted]d Viable Singleton Intrauterine pregnancy previously established criteria. 2. Growth is 37 %ile.  AFI is 17.5 cm.   Recommendations: 1.Clinical correlation with the patient's History and Physical Exam.

## 2020-01-20 ENCOUNTER — Encounter: Payer: Self-pay | Admitting: Obstetrics and Gynecology

## 2020-01-20 ENCOUNTER — Inpatient Hospital Stay
Admission: EM | Admit: 2020-01-20 | Discharge: 2020-01-20 | Disposition: A | Discharge status: Home / Self Care | Payer: Medicaid Other | Attending: Obstetrics and Gynecology | Admitting: Obstetrics and Gynecology

## 2020-01-20 ENCOUNTER — Other Ambulatory Visit: Payer: Self-pay

## 2020-01-20 DIAGNOSIS — Z283 Underimmunization status: Secondary | ICD-10-CM | POA: Diagnosis not present

## 2020-01-20 DIAGNOSIS — O09899 Supervision of other high risk pregnancies, unspecified trimester: Secondary | ICD-10-CM | POA: Diagnosis present

## 2020-01-20 DIAGNOSIS — Z3A00 Weeks of gestation of pregnancy not specified: Secondary | ICD-10-CM | POA: Diagnosis not present

## 2020-01-20 LAB — RUPTURE OF MEMBRANE (ROM)PLUS: Rom Plus: NEGATIVE

## 2020-01-20 NOTE — OB Triage Note (Signed)
Patient here for LOF, she states that this happened several hours ago maybe around 2 pm. She put on a maxi pad and that became wet, she is starting to hurt with irregular contractions as well. No bleeding. Feeling god fetal movement.Marland Kitchen

## 2020-01-20 NOTE — Discharge Summary (Signed)
Obstetric Discharge Summary  Patient ID: Yolanda Garcia MRN: [id number] DOB/AGE: 23-Apr-1996 24 y.o.   Date of Admission: 01/17/2020  Date of Discharge: 01/17/2020  Admitting Diagnosis: Observation at [redacted]w[redacted]d  Secondary Diagnosis:   Patient Active Problem List   Diagnosis Date Noted  . Braxton Hick's contraction   . Dehydration during pregnancy   . Indication for care in labor and delivery, antepartum 01/07/2020  . Back pain affecting pregnancy in third trimester   . Vaginitis during pregnancy in third trimester   . Indication for care/intervention related to labor/delivery, antepartum 12/20/2019  . Decreased fetal movement 12/19/2019  . Pregnant 12/19/2019  . Indication for care in labor or delivery 12/11/2019  . History of preterm delivery 12/07/2019  . Iron deficiency anemia 11/25/2019  . Pica 11/19/2019  . Low weight gain during pregnancy in third trimester 11/18/2019  . Pregnancy 10/28/2019  . Abdominal pain affecting pregnancy   . Maternal varicella, non-immune 09/10/2019  . Second trimester bleeding 09/09/2019  . Late prenatal care 09/09/2019  . Short interval between pregnancies affecting pregnancy in second trimester, antepartum 09/09/2019  . Nausea/vomiting in pregnancy 01/25/2019  . Pelvic pain affecting pregnancy in third trimester, antepartum 01/25/2019  . Anemia in pregnancy 01/05/2019  . Sickle cell trait (HCC) 08/23/2018       Discharge Diagnosis: No other diagnosis   Antepartum Procedures: NST and Negative ROM Plus   Brief Hospital Course   L&D OB Triage Note  Yolanda Garcia is a 24 y.o. [id number] female at [redacted]w[redacted]d, EDD Estimated Date of Delivery: 02/09/20 who presented to triage for complaints of decreased fetal movement, irregular contractions and leakage of fluid.    She was evaluated by the nurses with no significant findings for preterm labor or fetal distress. Vital signs stable. An NST was performed and has been reviewed by CNM.    NST  INTERPRETATION:  Indications: decreased fetal movement and rule out uterine contractions  Mode: External Baseline Rate (A): 130 bpm Variability: Moderate Accelerations: 15 x 15 Decelerations: None Contraction Frequency (min): 2-4  Impression: reactive  Plan: NST performed was reviewed and was found to be reactive. She was discharged home with bleeding/labor precautions.  Continue routine prenatal care. Follow up with CNM as previously scheduled.    Discharge Instructions: Per After Visit Summary.  Activity: Advance as tolerated. Pelvic rest for 6 weeks.  Also refer to After Visit Summary  Diet: Regular  Medications: Allergies as of 01/17/2020   No Known Allergies     Medication List    ASK your doctor about these medications   cyclobenzaprine 5 MG tablet Commonly known as: FLEXERIL Take 1 tablet (5 mg total) by mouth at bedtime.   hydrOXYzine 25 MG tablet Commonly known as: ATARAX/VISTARIL Take 1 tablet (25 mg total) by mouth every 6 (six) hours as needed for anxiety (contractions).   ondansetron 4 MG disintegrating tablet Commonly known as: Zofran ODT Take 1 tablet (4 mg total) by mouth every 8 (eight) hours as needed.      Outpatient follow up: As previously scheduled or sooner if needed  Postpartum contraception: Nexplanon  Discharged Condition: stable  Discharged to: home   Yolanda Garcia, CNM  [facility], [facility]

## 2020-01-23 DIAGNOSIS — O471 False labor at or after 37 completed weeks of gestation: Secondary | ICD-10-CM | POA: Insufficient documentation

## 2020-01-23 DIAGNOSIS — N898 Other specified noninflammatory disorders of vagina: Secondary | ICD-10-CM

## 2020-01-26 ENCOUNTER — Encounter: Payer: Self-pay | Admitting: Certified Nurse Midwife

## 2020-01-26 ENCOUNTER — Other Ambulatory Visit: Payer: Self-pay

## 2020-01-26 ENCOUNTER — Ambulatory Visit (INDEPENDENT_AMBULATORY_CARE_PROVIDER_SITE_OTHER): Payer: Medicaid Other | Admitting: Certified Nurse Midwife

## 2020-01-26 VITALS — BP 95/50 | HR 77 | Wt 119.9 lb

## 2020-01-26 DIAGNOSIS — Z3A38 38 weeks gestation of pregnancy: Secondary | ICD-10-CM

## 2020-01-26 DIAGNOSIS — Z3483 Encounter for supervision of other normal pregnancy, third trimester: Secondary | ICD-10-CM

## 2020-01-26 LAB — POCT URINALYSIS DIPSTICK OB
Bilirubin, UA: NEGATIVE
Blood, UA: NEGATIVE
Glucose, UA: NEGATIVE
Ketones, UA: NEGATIVE
Nitrite, UA: NEGATIVE
POC,PROTEIN,UA: NEGATIVE
Spec Grav, UA: 1.01 (ref 1.010–1.025)
Urobilinogen, UA: 0.2 E.U./dL
pH, UA: 6.5 (ref 5.0–8.0)

## 2020-01-26 NOTE — Patient Instructions (Signed)
Fetal Movement Counts Patient Name: ________________________________________________ Patient Due Date: ____________________ What is a fetal movement count?  A fetal movement count is the number of times that you feel your baby move during a certain amount of time. This may also be called a fetal kick count. A fetal movement count is recommended for every pregnant woman. You may be asked to start counting fetal movements as early as week 28 of your pregnancy. Pay attention to when your baby is most active. You may notice your baby's sleep and wake cycles. You may also notice things that make your baby move more. You should do a fetal movement count:  When your baby is normally most active.  At the same time each day. A good time to count movements is while you are resting, after having something to eat and drink. How do I count fetal movements? 1. Find a quiet, comfortable area. Sit, or lie down on your side. 2. Write down the date, the start time and stop time, and the number of movements that you felt between those two times. Take this information with you to your health care visits. 3. Write down your start time when you feel the first movement. 4. Count kicks, flutters, swishes, rolls, and jabs. You should feel at least 10 movements. 5. You may stop counting after you have felt 10 movements, or if you have been counting for 2 hours. Write down the stop time. 6. If you do not feel 10 movements in 2 hours, contact your health care provider for further instructions. Your health care provider may want to do additional tests to assess your baby's well-being. Contact a health care provider if:  You feel fewer than 10 movements in 2 hours.  Your baby is not moving like he or she usually does. Date: ____________ Start time: ____________ Stop time: ____________ Movements: ____________ Date: ____________ Start time: ____________ Stop time: ____________ Movements: ____________ Date: ____________  Start time: ____________ Stop time: ____________ Movements: ____________ Date: ____________ Start time: ____________ Stop time: ____________ Movements: ____________ Date: ____________ Start time: ____________ Stop time: ____________ Movements: ____________ Date: ____________ Start time: ____________ Stop time: ____________ Movements: ____________ Date: ____________ Start time: ____________ Stop time: ____________ Movements: ____________ Date: ____________ Start time: ____________ Stop time: ____________ Movements: ____________ Date: ____________ Start time: ____________ Stop time: ____________ Movements: ____________ This information is not intended to replace advice given to you by your health care provider. Make sure you discuss any questions you have with your health care provider. Document Revised: 01/06/2019 Document Reviewed: 01/06/2019 Elsevier Patient Education  2020 Elsevier Inc.  

## 2020-01-26 NOTE — Progress Notes (Signed)
ROB-Ready to have baby. Requests IOL as soon as possible. Practice policies discussed in detail with patient. SVE unchanged, membrane sweep preformed. Anticipatory guidance regarding course of prenatal care. Reviewed red flag symptoms and when to call. RTC x 1 week for ROB or sooner if needed.

## 2020-01-28 ENCOUNTER — Inpatient Hospital Stay: Admit: 2020-01-28 | Payer: Medicaid Other | Admitting: Obstetrics and Gynecology

## 2020-01-28 ENCOUNTER — Encounter: Payer: Self-pay | Admitting: Obstetrics and Gynecology

## 2020-01-28 ENCOUNTER — Observation Stay (HOSPITAL_BASED_OUTPATIENT_CLINIC_OR_DEPARTMENT_OTHER)
Admission: EM | Admit: 2020-01-28 | Discharge: 2020-01-29 | Disposition: A | Discharge status: Home / Self Care | Payer: Medicaid Other | Source: Home / Self Care | Admitting: Certified Nurse Midwife

## 2020-01-28 ENCOUNTER — Other Ambulatory Visit: Payer: Self-pay

## 2020-01-28 DIAGNOSIS — O09899 Supervision of other high risk pregnancies, unspecified trimester: Secondary | ICD-10-CM

## 2020-01-28 DIAGNOSIS — Z3A38 38 weeks gestation of pregnancy: Secondary | ICD-10-CM | POA: Insufficient documentation

## 2020-01-28 DIAGNOSIS — O471 False labor at or after 37 completed weeks of gestation: Secondary | ICD-10-CM

## 2020-01-28 MED ORDER — ACETAMINOPHEN 500 MG PO TABS: 1000.0000 mg | ORAL_TABLET | Freq: Four times a day (QID) | ORAL | Status: DC | PRN

## 2020-01-28 MED ORDER — ACETAMINOPHEN 500 MG PO TABS
ORAL_TABLET | ORAL | Status: AC
  Administered 2020-01-28: 1000 mg via ORAL
  Filled 2020-01-28: qty 2

## 2020-01-28 NOTE — OB Triage Note (Signed)
Pt arrived in triage with complaints of contractions that started around 1pm today, but states she is always contracting. Unsure of frequency, states "come and go". Also reports "bronw/yellow" discharge. Unsure if baby is moving like normal. No reports of leaking of fluid. Monitors applied and assessing. Initial fetal heart rate 130.

## 2020-01-29 ENCOUNTER — Other Ambulatory Visit: Payer: Self-pay

## 2020-01-29 ENCOUNTER — Inpatient Hospital Stay
Admission: EM | Admit: 2020-01-29 | Discharge: 2020-01-31 | DRG: 807 | Disposition: A | Discharge status: Home / Self Care | Payer: Medicaid Other | Attending: Certified Nurse Midwife | Admitting: Certified Nurse Midwife

## 2020-01-29 ENCOUNTER — Encounter: Payer: Self-pay | Admitting: Certified Nurse Midwife

## 2020-01-29 DIAGNOSIS — D573 Sickle-cell trait: Secondary | ICD-10-CM | POA: Diagnosis present

## 2020-01-29 DIAGNOSIS — Z30017 Encounter for initial prescription of implantable subdermal contraceptive: Secondary | ICD-10-CM

## 2020-01-29 DIAGNOSIS — O09892 Supervision of other high risk pregnancies, second trimester: Secondary | ICD-10-CM

## 2020-01-29 DIAGNOSIS — O09899 Supervision of other high risk pregnancies, unspecified trimester: Secondary | ICD-10-CM

## 2020-01-29 DIAGNOSIS — Z20822 Contact with and (suspected) exposure to covid-19: Secondary | ICD-10-CM | POA: Diagnosis present

## 2020-01-29 DIAGNOSIS — D509 Iron deficiency anemia, unspecified: Secondary | ICD-10-CM | POA: Diagnosis present

## 2020-01-29 DIAGNOSIS — O99019 Anemia complicating pregnancy, unspecified trimester: Secondary | ICD-10-CM | POA: Diagnosis present

## 2020-01-29 DIAGNOSIS — O471 False labor at or after 37 completed weeks of gestation: Secondary | ICD-10-CM

## 2020-01-29 DIAGNOSIS — O09213 Supervision of pregnancy with history of pre-term labor, third trimester: Secondary | ICD-10-CM

## 2020-01-29 DIAGNOSIS — D649 Anemia, unspecified: Secondary | ICD-10-CM

## 2020-01-29 DIAGNOSIS — O0930 Supervision of pregnancy with insufficient antenatal care, unspecified trimester: Secondary | ICD-10-CM

## 2020-01-29 DIAGNOSIS — O0933 Supervision of pregnancy with insufficient antenatal care, third trimester: Secondary | ICD-10-CM

## 2020-01-29 DIAGNOSIS — O2613 Low weight gain in pregnancy, third trimester: Secondary | ICD-10-CM | POA: Diagnosis not present

## 2020-01-29 DIAGNOSIS — O9902 Anemia complicating childbirth: Secondary | ICD-10-CM | POA: Diagnosis present

## 2020-01-29 DIAGNOSIS — O99013 Anemia complicating pregnancy, third trimester: Secondary | ICD-10-CM

## 2020-01-29 DIAGNOSIS — O219 Vomiting of pregnancy, unspecified: Secondary | ICD-10-CM | POA: Diagnosis present

## 2020-01-29 DIAGNOSIS — Z3A38 38 weeks gestation of pregnancy: Secondary | ICD-10-CM

## 2020-01-29 DIAGNOSIS — O26893 Other specified pregnancy related conditions, third trimester: Secondary | ICD-10-CM | POA: Diagnosis present

## 2020-01-29 DIAGNOSIS — Z975 Presence of (intrauterine) contraceptive device: Secondary | ICD-10-CM

## 2020-01-29 DIAGNOSIS — Z283 Underimmunization status: Secondary | ICD-10-CM

## 2020-01-29 DIAGNOSIS — Z8751 Personal history of pre-term labor: Secondary | ICD-10-CM

## 2020-01-29 DIAGNOSIS — Z2839 Other underimmunization status: Secondary | ICD-10-CM

## 2020-01-29 LAB — CBC
HCT: 30.7 % — ABNORMAL LOW (ref 36.0–46.0)
Hemoglobin: 10 g/dL — ABNORMAL LOW (ref 12.0–15.0)
MCH: 25.1 pg — ABNORMAL LOW (ref 26.0–34.0)
MCHC: 32.6 g/dL (ref 30.0–36.0)
MCV: 76.9 fL — ABNORMAL LOW (ref 80.0–100.0)
Platelets: 171 10*3/uL (ref 150–400)
RBC: 3.99 MIL/uL (ref 3.87–5.11)
RDW: 17.4 % — ABNORMAL HIGH (ref 11.5–15.5)
WBC: 4.9 10*3/uL (ref 4.0–10.5)
nRBC: 0 % (ref 0.0–0.2)

## 2020-01-29 LAB — TYPE AND SCREEN
ABO/RH(D): A POS
Antibody Screen: NEGATIVE

## 2020-01-29 LAB — SARS CORONAVIRUS 2 BY RT PCR (HOSPITAL ORDER, PERFORMED IN ~~LOC~~ HOSPITAL LAB): SARS Coronavirus 2: NEGATIVE

## 2020-01-29 MED ORDER — OXYTOCIN BOLUS FROM INFUSION
333.0000 mL | Freq: Once | INTRAVENOUS | Status: AC
  Administered 2020-01-29: 333 mL via INTRAVENOUS

## 2020-01-29 MED ORDER — TERBUTALINE SULFATE 1 MG/ML IJ SOLN: 0.2500 mg | Freq: Once | INTRAMUSCULAR | Status: DC | PRN

## 2020-01-29 MED ORDER — SODIUM CHLORIDE 0.9% FLUSH: 3.0000 mL | Freq: Two times a day (BID) | INTRAVENOUS | Status: DC

## 2020-01-29 MED ORDER — LIDOCAINE HCL (PF) 1 % IJ SOLN
INTRAMUSCULAR | Status: AC
  Filled 2020-01-29: qty 30

## 2020-01-29 MED ORDER — LACTATED RINGERS IV SOLN: INTRAVENOUS | Status: DC

## 2020-01-29 MED ORDER — WITCH HAZEL-GLYCERIN EX PADS: 1.0000 "application " | MEDICATED_PAD | CUTANEOUS | Status: DC | PRN

## 2020-01-29 MED ORDER — SOD CITRATE-CITRIC ACID 500-334 MG/5ML PO SOLN: 30.0000 mL | ORAL | Status: DC | PRN

## 2020-01-29 MED ORDER — BENZOCAINE-MENTHOL 20-0.5 % EX AERO
INHALATION_SPRAY | CUTANEOUS | Status: AC
  Filled 2020-01-29: qty 56

## 2020-01-29 MED ORDER — OXYCODONE-ACETAMINOPHEN 5-325 MG PO TABS: 2.0000 | ORAL_TABLET | ORAL | Status: DC | PRN

## 2020-01-29 MED ORDER — AMMONIA AROMATIC IN INHA
RESPIRATORY_TRACT | Status: AC
  Filled 2020-01-29: qty 10

## 2020-01-29 MED ORDER — ACETAMINOPHEN 325 MG PO TABS
650.0000 mg | ORAL_TABLET | ORAL | Status: DC | PRN
  Filled 2020-01-29: qty 2

## 2020-01-29 MED ORDER — IBUPROFEN 600 MG PO TABS: 600.0000 mg | ORAL_TABLET | Freq: Four times a day (QID) | ORAL | Status: DC

## 2020-01-29 MED ORDER — ACETAMINOPHEN 325 MG PO TABS: 650.0000 mg | ORAL_TABLET | ORAL | Status: DC | PRN

## 2020-01-29 MED ORDER — OXYTOCIN-SODIUM CHLORIDE 30-0.9 UT/500ML-% IV SOLN
1.0000 m[IU]/min | INTRAVENOUS | Status: DC
  Administered 2020-01-29: 2 m[IU]/min via INTRAVENOUS

## 2020-01-29 MED ORDER — ONDANSETRON HCL 4 MG/2ML IJ SOLN
4.0000 mg | Freq: Four times a day (QID) | INTRAMUSCULAR | Status: DC | PRN
  Administered 2020-01-29: 4 mg via INTRAVENOUS
  Filled 2020-01-29: qty 2

## 2020-01-29 MED ORDER — LIDOCAINE HCL (PF) 1 % IJ SOLN: 30.0000 mL | INTRAMUSCULAR | Status: DC | PRN

## 2020-01-29 MED ORDER — SENNOSIDES-DOCUSATE SODIUM 8.6-50 MG PO TABS
2.0000 | ORAL_TABLET | ORAL | Status: DC
  Administered 2020-01-30 – 2020-01-31 (×2): 2 via ORAL
  Filled 2020-01-29 (×3): qty 2

## 2020-01-29 MED ORDER — ONDANSETRON HCL 4 MG PO TABS
4.0000 mg | ORAL_TABLET | ORAL | Status: DC | PRN
  Administered 2020-01-31: 4 mg via ORAL
  Filled 2020-01-29 (×2): qty 1

## 2020-01-29 MED ORDER — BUTORPHANOL TARTRATE 1 MG/ML IJ SOLN
1.0000 mg | INTRAMUSCULAR | Status: DC | PRN
  Administered 2020-01-29: 1 mg via INTRAVENOUS
  Filled 2020-01-29: qty 1

## 2020-01-29 MED ORDER — FERROUS SULFATE 325 (65 FE) MG PO TABS
325.0000 mg | ORAL_TABLET | Freq: Two times a day (BID) | ORAL | Status: DC
  Administered 2020-01-30 – 2020-01-31 (×3): 325 mg via ORAL
  Filled 2020-01-29 (×4): qty 1

## 2020-01-29 MED ORDER — OXYTOCIN-SODIUM CHLORIDE 30-0.9 UT/500ML-% IV SOLN: 2.5000 [IU]/h | INTRAVENOUS | Status: DC

## 2020-01-29 MED ORDER — SODIUM CHLORIDE 0.9 % IV SOLN: 250.0000 mL | INTRAVENOUS | Status: DC | PRN

## 2020-01-29 MED ORDER — MISOPROSTOL 200 MCG PO TABS
ORAL_TABLET | ORAL | Status: AC
  Filled 2020-01-29: qty 4

## 2020-01-29 MED ORDER — LIDOCAINE HCL 1 % IJ SOLN
0.0000 mL | Freq: Once | INTRAMUSCULAR | Status: DC | PRN
  Filled 2020-01-29 (×2): qty 20

## 2020-01-29 MED ORDER — OXYTOCIN-SODIUM CHLORIDE 30-0.9 UT/500ML-% IV SOLN
INTRAVENOUS | Status: AC
  Filled 2020-01-29: qty 500

## 2020-01-29 MED ORDER — IBUPROFEN 600 MG PO TABS
ORAL_TABLET | ORAL | Status: AC
  Administered 2020-01-29: 600 mg
  Filled 2020-01-29: qty 1

## 2020-01-29 MED ORDER — BENZOCAINE-MENTHOL 20-0.5 % EX AERO: 1.0000 "application " | INHALATION_SPRAY | CUTANEOUS | Status: DC | PRN

## 2020-01-29 MED ORDER — SODIUM CHLORIDE 0.9% FLUSH: 3.0000 mL | INTRAVENOUS | Status: DC | PRN

## 2020-01-29 MED ORDER — VARICELLA VIRUS VACCINE LIVE 1350 PFU/0.5ML IJ SUSR
0.5000 mL | Freq: Once | INTRAMUSCULAR | Status: DC
  Filled 2020-01-29: qty 0.5

## 2020-01-29 MED ORDER — ETONOGESTREL 68 MG ~~LOC~~ IMPL
68.0000 mg | DRUG_IMPLANT | Freq: Once | SUBCUTANEOUS | Status: AC
  Administered 2020-01-31: 68 mg via SUBCUTANEOUS
  Filled 2020-01-29 (×4): qty 1

## 2020-01-29 MED ORDER — LACTATED RINGERS IV SOLN: 500.0000 mL | INTRAVENOUS | Status: DC | PRN

## 2020-01-29 MED ORDER — COCONUT OIL OIL
1.0000 "application " | TOPICAL_OIL | Status: DC | PRN
  Filled 2020-01-29: qty 120

## 2020-01-29 MED ORDER — ONDANSETRON HCL 4 MG/2ML IJ SOLN: 4.0000 mg | INTRAMUSCULAR | Status: DC | PRN

## 2020-01-29 MED ORDER — DIPHENHYDRAMINE HCL 25 MG PO CAPS: 25.0000 mg | ORAL_CAPSULE | Freq: Four times a day (QID) | ORAL | Status: DC | PRN

## 2020-01-29 MED ORDER — ZOLPIDEM TARTRATE 5 MG PO TABS: 5.0000 mg | ORAL_TABLET | Freq: Every evening | ORAL | Status: DC | PRN

## 2020-01-29 MED ORDER — SIMETHICONE 80 MG PO CHEW: 80.0000 mg | CHEWABLE_TABLET | ORAL | Status: DC | PRN

## 2020-01-29 MED ORDER — DIBUCAINE (PERIANAL) 1 % EX OINT: 1.0000 "application " | TOPICAL_OINTMENT | CUTANEOUS | Status: DC | PRN

## 2020-01-29 MED ORDER — OXYTOCIN 10 UNIT/ML IJ SOLN
INTRAMUSCULAR | Status: AC
  Filled 2020-01-29: qty 2

## 2020-01-29 MED ORDER — PRENATAL MULTIVITAMIN CH
1.0000 | ORAL_TABLET | Freq: Every day | ORAL | Status: DC
  Administered 2020-01-30: 1 via ORAL
  Filled 2020-01-29: qty 1

## 2020-01-29 MED ORDER — OXYCODONE-ACETAMINOPHEN 5-325 MG PO TABS: 1.0000 | ORAL_TABLET | ORAL | Status: DC | PRN

## 2020-01-29 NOTE — Discharge Instructions (Signed)

## 2020-01-29 NOTE — Discharge Summary (Signed)
Obstetric Discharge Summary  Patient ID: Coutney Wildermuth MRN: [id number] DOB/AGE: 23-Mar-1996 24 y.o.   Date of Admission: 01/28/2020  Date of Discharge:  01/29/20  Admitting Diagnosis: Observation at [redacted]w[redacted]d  Secondary Diagnosis:  Patient Active Problem List   Diagnosis Date Noted  . Vaginal discharge during pregnancy in third trimester   . False labor   . Braxton Hick's contraction   . Dehydration during pregnancy   . Indication for care in labor and delivery, antepartum 01/07/2020  . Back pain affecting pregnancy in third trimester   . Vaginitis during pregnancy in third trimester   . Indication for care/intervention related to labor/delivery, antepartum 12/20/2019  . Decreased fetal movement 12/19/2019  . Pregnant 12/19/2019  . Indication for care in labor or delivery 12/11/2019  . History of preterm delivery 12/07/2019  . Iron deficiency anemia 11/25/2019  . Pica 11/19/2019  . Low weight gain during pregnancy in third trimester 11/18/2019  . Pregnancy 10/28/2019  . Abdominal pain affecting pregnancy   . Maternal varicella, non-immune 09/10/2019  . Second trimester bleeding 09/09/2019  . Late prenatal care 09/09/2019  . Short interval between pregnancies affecting pregnancy in second trimester, antepartum 09/09/2019  . Nausea/vomiting in pregnancy 01/25/2019  . Pelvic pain affecting pregnancy in third trimester, antepartum 01/25/2019  . Anemia in pregnancy 01/05/2019  . Sickle cell trait (HCC) 08/23/2018   Discharge Diagnosis: No other diagnosis   Antepartum Procedures: NST    Brief Hospital Course   L&D OB Triage Note  Zarinah Oviatt is a 24 y.o. G57P0101 female at [redacted]w[redacted]d, EDD Estimated Date of Delivery: 02/09/20 who presented to triage for complaints of contractions.  She was evaluated by the nurses with no significant finding for labor or fetal distress. Vital signs stable. An NST was performed and has been reviewed by CNM. She was treated with Tylenol.   NST  INTERPRETATION: Indications: rule out uterine contractions  Mode: External Baseline Rate (A): 160 bpm (fht) Variability: Moderate Accelerations: 15 x 15 Decelerations: None Contraction Frequency (min): 2-8  Impression: reactive  Dilation: 3.5 Effacement (%): 60 Cervical Position: Posterior Station: -3 Exam by:: [facility] RN  Plan: NST performed was reviewed and was found to be reactive. She was discharged home with bleeding/labor precautions.  Continue routine prenatal care. Follow up with CNM as previously scheduled.   Discharge Instructions: Per After Visit Summary.  Activity: Advance as tolerated. Also refer to After Visit Summary.  Diet: Regular  Medications: Allergies as of 01/29/2020   No Known Allergies     Medication List    TAKE these medications   cyclobenzaprine 5 MG tablet Commonly known as: FLEXERIL Take 1 tablet (5 mg total) by mouth at bedtime.   hydrOXYzine 25 MG tablet Commonly known as: ATARAX/VISTARIL Take 1 tablet (25 mg total) by mouth every 6 (six) hours as needed for anxiety (contractions).   ondansetron 4 MG disintegrating tablet Commonly known as: Zofran ODT Take 1 tablet (4 mg total) by mouth every 8 (eight) hours as needed.      Outpatient follow up:   Follow-up Information    Gunnar Bulla, CNM Follow up.   Specialties: Certified Nurse Midwife, Obstetrics and Gynecology, Radiology Why: As previously scheduled or sooner if needed Contact information: [address] [place] [address] [phone]              Postpartum contraception: Nexplanon  Discharged Condition: stable  Discharged to: home   Serafina Royals, CNM [facility], [facility] 01/29/20 otherwise

## 2020-01-29 NOTE — Progress Notes (Signed)
Pt transferred to [facility] and stable at this time of transfer. Report given to [facility]

## 2020-01-29 NOTE — H&P (Signed)
Obstetric History and Physical  Yolanda Garcia is a 24 y.o. G2P0101 with IUP at [redacted]w[redacted]d presenting with worsening contractions.   Patient states she has been having  regular, every five (5) to 10 minutes contractions, none vaginal bleeding, intact membranes, with active fetal movement.    Denies difficulty breathing or respiratory distress, chest pain, dysuria, and leg pain or swelling.   Prenatal Course  Source of Care: [facility]-initial visit at 14 weeks, total visits: 14  Pregnancy complications or risks: Late prenatal care, Short interval pregnancy, Low weight pain pregnancy, Varicella non-immune, Nausea and vomiting in pregnancy, Anemia in pregnancy, History of preterm delivery  Prenatal labs and studies:  ABO, Rh: --/--/A POS (03/11 1424)  Antibody: Negative (04/09 1128)  Rubella: 1.73 (04/09 1128)  Varicella: Non immune  RPR: NON REACTIVE (07/11 1734)   HBsAg: Negative (04/09 1128)   HIV: Non Reactive (04/09 1128)   ZHG:DJMEQAST/-- (08/12 1631)  1 hr Glucola: 62 (06/18 0831)  Genetic screening: Low risk female (04/09 1125)  Anatomy US: Complete, normal (04/22 0940)  Past Medical History:  Diagnosis Date   Anemia    Panic attack     Past Surgical History:  Procedure Laterality Date   NO PAST SURGERIES      OB History  Gravida Para Term Preterm AB Living  2 1   1   1   SAB TAB Ectopic Multiple Live Births        0 1    # Outcome Date GA Lbr Len/2nd Weight Sex Delivery Anes PTL Lv  2 Current           1 Preterm 02/14/19 [redacted]w[redacted]d / 00:22 2580 g M Vag-Spont None  LIV    Social History   Socioeconomic History   Marital status: Single    Spouse name: Not on file   Number of children: Not on file   Years of education: Not on file   Highest education level: Not on file  Occupational History   Not on file  Tobacco Use   Smoking status: Never Smoker   Smokeless tobacco: Never Used  Vaping Use   Vaping Use: Never used  Substance and Sexual  Activity   Alcohol use: No   Drug use: Never   Sexual activity: Not Currently  Other Topics Concern   Not on file  Social History Narrative   Not on file   Social Determinants of Health   Financial Resource Strain:    Difficulty of Paying Living Expenses: Not on file  Food Insecurity:    Worried About Running Out of Food in the Last Year: Not on file   Ran Out of Food in the Last Year: Not on file  Transportation Needs:    Lack of Transportation (Medical): Not on file   Lack of Transportation (Non-Medical): Not on file  Physical Activity:    Days of Exercise per Week: Not on file   Minutes of Exercise per Session: Not on file  Stress:    Feeling of Stress : Not on file  Social Connections:    Frequency of Communication with Friends and Family: Not on file   Frequency of Social Gatherings with Friends and Family: Not on file   Attends Religious Services: Not on file   Active Member of Clubs or Organizations: Not on file   Attends M Meetings: Not on file   Marital Status: Not on file    Family History  Problem Relation Age of Onset   Healthy  Mother    Healthy Father    Healthy Maternal Grandmother    Healthy Maternal Grandfather    Healthy Paternal Grandmother    Healthy Paternal Grandfather     Medications Prior to Admission  Medication Sig Dispense Refill Last Dose   cyclobenzaprine (FLEXERIL) 5 MG tablet Take 1 tablet (5 mg total) by mouth at bedtime. 10 tablet 0    hydrOXYzine (ATARAX/VISTARIL) 25 MG tablet Take 1 tablet (25 mg total) by mouth every 6 (six) hours as needed for anxiety (contractions). 30 tablet 0    ondansetron (ZOFRAN ODT) 4 MG disintegrating tablet Take 1 tablet (4 mg total) by mouth every 8 (eight) hours as needed. 30 tablet 0     No Known Allergies  Review of Systems: Negative except for what is mentioned in HPI.  Physical Exam:  BP 102/62    Pulse 79    LMP 05/27/2019 Comment: 2 periods in  Dec  GENERAL: Well-developed, well-nourished female in no acute distress.   LUNGS: Clear to auscultation bilaterally.   HEART: Regular rate and rhythm.  ABDOMEN: Soft, nontender, nondistended, gravid.  EXTREMITIES: Nontender, no edema, 2+ distal pulses.  Cervical Exam: Dilation: 4.5 Effacement (%): 60, 70 Station: -3 Presentation: Vertex Exam by:: Elyn Peers RN   FHR Category I  Contractions: Every four (4) to five (5) minutes, soft resting tone   Pertinent Labs/Studies:    No results found for this or any previous visit (from the past 24 hour(s)).  Assessment : Yolanda Garcia is a 24 y.o. G2P0101 at [redacted]w[redacted]d being admitted for labor, Rh positive, GBS negative, Late prenatal care, Short interval pregnancy, Low weight pain pregnancy, Varicella non-immune, Nausea and vomiting in pregnancy, Anemia in pregnancy, History of preterm delivery  FHR Category I  Plan:  Admit to birthing suites, see orders.   Labor: Expectant management.  Induction/Augmentation as needed, per protocol.   Delivery plan: Hopeful for vaginal birth.   Dr. Valentino Saxon notified of admission and plan of care.    Gunnar Bulla, CNM [facility], [facility] 01/29/20 1:55 PM

## 2020-01-30 LAB — CBC
HCT: 26.9 % — ABNORMAL LOW (ref 36.0–46.0)
Hemoglobin: 8.7 g/dL — ABNORMAL LOW (ref 12.0–15.0)
MCH: 25.2 pg — ABNORMAL LOW (ref 26.0–34.0)
MCHC: 32.3 g/dL (ref 30.0–36.0)
MCV: 78 fL — ABNORMAL LOW (ref 80.0–100.0)
Platelets: 157 10*3/uL (ref 150–400)
RBC: 3.45 MIL/uL — ABNORMAL LOW (ref 3.87–5.11)
RDW: 17.3 % — ABNORMAL HIGH (ref 11.5–15.5)
WBC: 9.2 10*3/uL (ref 4.0–10.5)
nRBC: 0 % (ref 0.0–0.2)

## 2020-01-30 LAB — RPR: RPR Ser Ql: NONREACTIVE

## 2020-01-30 MED ORDER — IBUPROFEN 600 MG PO TABS
600.0000 mg | ORAL_TABLET | Freq: Four times a day (QID) | ORAL | Status: DC
  Administered 2020-01-30 – 2020-01-31 (×3): 600 mg via ORAL
  Filled 2020-01-30 (×4): qty 1

## 2020-01-30 NOTE — Progress Notes (Signed)
Patient ID: Yolanda Garcia, female   DOB: 1995/09/06, 24 y.o.   MRN: [id number]  Post Partum Day # 1, s/p spontaneous vaginal birth, Rh positive, Anemia, Breastfeeding  Subjective:  Patient sitting in bed, holding infant. Doing well, no concerns. Desires Nexplanon placement prior to discharge.   Denies difficulty breathing or respiratory distress, chest pain, abdominal pain, excessive vaginal bleeding, dysuria, and leg pain or swelling.   Objective: Temp:  [97.5 F (36.4 C)-98.4 F (36.9 C)] 98.2 F (36.8 C) (08/30 1133) Pulse Rate:  [67-239] 67 (08/30 1133) Resp:  [16-18] 18 (08/30 1133) BP: (91-138)/(39-118) 104/58 (08/30 1133) SpO2:  [97 %-100 %] 97 % (08/30 1133) Weight:  [56 kg] 56 kg (08/29 2030)  Physical Exam:   General: alert and cooperative   Lungs: clear to auscultation bilaterally  Breasts: deferred, no complaints  Heart: normal apical impulse  Abdomen: soft, non-tender; bowel sounds normal; no masses,  no organomegaly  Pelvis: Lochia: appropriate,  Uterine Fundus: firm  Extremities: DVT Evaluation: No evidence of DVT seen on physical exam.  Recent Labs    01/29/20 1416 01/30/20 0515  HGB 10.0* 8.7*  HCT 30.7* 26.9*    Assessment:  24 year old G2P2 female, Post Partum Day # 1, s/p spontaneous vaginal birth, Rh positive, Anemia   Breastfeeding  Plan:  Routine postpartum care and education.   Awaiting Nexplanon from pharmacy.   Reviewed red flag symptoms and when to call.   Continue orders as written. Reassess as needed.   Anticipate discharge tomorrow.    LOS: 1 day   Gunnar Bulla, CNM [facility] 01/30/2020 1:11 PM

## 2020-01-30 NOTE — Lactation Note (Signed)
This note was copied from a baby's chart. Lactation Consultation Note  Patient Name: Yolanda Garcia BWLSL'H Date: 01/30/2020 Reason for consult: Initial assessment;Early term 37-38.6wks   Maternal Data Formula Feeding for Exclusion: No Has patient been taught Hand Expression?: Yes Does the patient have breastfeeding experience prior to this delivery?: Yes  Feeding Feeding Type: Breast Fed Baby latches easily to breast,needs some stimulation to continue to suck, mom encouraged to stimulate baby to continue to nurse, mom encouraged to offer both breasts at a feeding   LATCH Score Latch: Grasps breast easily, tongue down, lips flanged, rhythmical sucking.  Audible Swallowing: A few with stimulation  Type of Nipple: Everted at rest and after stimulation  Comfort (Breast/Nipple): Soft / non-tender  Hold (Positioning): Assistance needed to correctly position infant at breast and maintain latch.  LATCH Score: 8  Interventions Interventions: Breast feeding basics reviewed;Assisted with latch;Hand express;Support pillows  Lactation Tools Discussed/Used WIC Program: Yes LC name and no written on white board  Consult Status Consult Status: Follow-up Date: 01/31/20 Follow-up type: In-patient    Dyann Kief 01/30/2020, 3:05 PM

## 2020-01-31 ENCOUNTER — Telehealth: Payer: Self-pay | Admitting: Certified Nurse Midwife

## 2020-01-31 DIAGNOSIS — Z30017 Encounter for initial prescription of implantable subdermal contraceptive: Secondary | ICD-10-CM

## 2020-01-31 DIAGNOSIS — Z975 Presence of (intrauterine) contraceptive device: Secondary | ICD-10-CM

## 2020-01-31 MED ORDER — ACETAMINOPHEN 325 MG PO TABS
650.0000 mg | ORAL_TABLET | ORAL | 0 refills | Status: DC | PRN
Start: 2020-01-31 — End: 2020-03-26

## 2020-01-31 MED ORDER — FERROUS SULFATE 325 (65 FE) MG PO TABS
325.0000 mg | ORAL_TABLET | Freq: Two times a day (BID) | ORAL | 3 refills | Status: DC
Start: 2020-01-31 — End: 2020-02-17

## 2020-01-31 MED ORDER — IBUPROFEN 600 MG PO TABS: 600.0000 mg | ORAL_TABLET | Freq: Four times a day (QID) | ORAL | 0 refills | Status: DC

## 2020-01-31 MED ORDER — COCONUT OIL OIL: 1.0000 "application " | TOPICAL_OIL | 0 refills | Status: DC | PRN

## 2020-01-31 NOTE — Progress Notes (Signed)
Procedure Note:  Yolanda Garcia is a 24 y.o. year old African American female who desires postpartum Nexplanon insertion prior to discharge 24 hours after spontaneous vaginal birth of liveborn female infant.   Risks/benefits/side effects of Nexplanon have been discussed and her questions have been answered.  Specifically, a failure rate of 06/998 has been reported, with an increased failure rate if pt takes St. John's Wort and/or antiseizure medicaitons.  Biddie Sebek is aware of the common side effect of irregular bleeding, which the incidence of decreases over time.  BP (!) 96/58 (BP Location: Left Arm) Comment: nurse Lavella Lemons notified  Pulse 65   Temp 98.1 F (36.7 C) (Oral)   Resp 18   Ht 5\' 6"  (1.676 m)   Wt 56 kg   LMP 05/27/2019 Comment: 2 periods in Dec  SpO2 100%   Breastfeeding Unknown   BMI 19.93 kg/m   She is right-handed, so her left arm, approximately 4 inches proximal from the elbow, was cleansed with alcohol and anesthetized with 2cc of 2% Lidocaine.  The area was cleansed again with betadine and the Nexplanon was inserted per manufacturer's recommendations without difficulty.  A steri-strip and pressure bandage were applied.  Pt was instructed to keep the area clean and dry, remove pressure bandage in 24 hours, and keep insertion site covered with the steri-strip for 3-5 days.  Back up contraception was recommended for 2 weeks.  She was given a card indicating date Nexplanon was inserted and date it needs to be removed.   Reviewed red flag symptoms and when to call.   RTC for postpartum visit or sooner if needed.    Jan, CNM [facility], [facility] 01/31/20 10:26 AM   NDC: 02/02/20 Lot: [id number] Exp: 06/15/2022

## 2020-01-31 NOTE — Lactation Note (Signed)
This note was copied from a baby's chart. Lactation Consultation Note  Patient Name: Yolanda Garcia [id number] Date: 01/31/2020 Reason for consult: Follow-up assessment   Maternal Data Formula Feeding for Exclusion: No Has patient been taught Hand Expression?: Yes Does the patient have breastfeeding experience prior to this delivery?: Yes  Mom feels confident with breastfeeding daughter. She feels baby is feeding better than her son, who was born at 68 weeks.   Feeding  Currently primarily breastfeeding, mom reports that she plans to continue with breastfeeding and will introduce formula more regularly over the next month. LC educated on impact formula can have on milk production, and encouraged breastfeeding as long as desired  LATCH Score                   Interventions Interventions: Breast feeding basics reviewed  Educated mom on both breast and bottle feeding, breast and nipple care, breast fullness and engorgement and management of both.  Lactation Tools Discussed/Used WIC Program: Yes Information given for community breastfeeding resources and support and outpatient lactation services available through [facility]  Consult Status Consult Status: Complete Date: 01/31/20 Follow-up type: Call as needed    Danford Bad 01/31/2020, 10:04 AM

## 2020-01-31 NOTE — Discharge Summary (Signed)
Obstetric Discharge Summary  Patient ID: Yolanda Garcia MRN: [id number] DOB/AGE: [birth year] 24 y.o.   Date of Admission: 01/29/2020  Date of Discharge:  01/31/20  Admitting Diagnosis: Onset of Labor at [redacted]w[redacted]d  Secondary Diagnosis:   Patient Active Problem List   Diagnosis Date Noted  . Nexplanon insertion   . Normal labor 01/29/2020  . Vaginal delivery   . Vaginal discharge during pregnancy in third trimester   . False labor after 37 completed weeks of gestation   . Braxton Hick's contraction   . Dehydration during pregnancy   . Indication for care in labor and delivery, antepartum 01/07/2020  . Back pain affecting pregnancy in third trimester   . Vaginitis during pregnancy in third trimester   . Indication for care/intervention related to labor/delivery, antepartum 12/20/2019  . Decreased fetal movement 12/19/2019  . Pregnant 12/19/2019  . Indication for care in labor or delivery 12/11/2019  . History of preterm delivery 12/07/2019  . Iron deficiency anemia 11/25/2019  . Pica 11/19/2019  . Low weight gain during pregnancy in third trimester 11/18/2019  . Pregnancy 10/28/2019  . Abdominal pain affecting pregnancy   . Maternal varicella, non-immune 09/10/2019  . Second trimester bleeding 09/09/2019  . Late prenatal care 09/09/2019  . Short interval between pregnancies affecting pregnancy in second trimester, antepartum 09/09/2019  . Nausea/vomiting in pregnancy 01/25/2019  . Pelvic pain affecting pregnancy in third trimester, antepartum 01/25/2019  . Anemia in pregnancy 01/05/2019  . Sickle cell trait (HCC) 08/23/2018    Mode of Delivery: normal spontaneous vaginal delivery     Discharge Diagnosis: No other diagnosis   Intrapartum Procedures: Atificial rupture of membranes and pitocin augmentation   Post partum procedures: None  Complications: None   Brief Hospital Course   Yolanda Garcia is a [id number] who had a SVD on 01/29/2020;  for further details, please  refer to the delivey summary.  Patient had an uncomplicated postpartum course.  By time of discharge on PPD#2, her pain was controlled on oral pain medications; she had appropriate lochia and was ambulating, voiding without difficulty and tolerating regular diet.  She was deemed stable for discharge to home.    Labs: CBC Latest Ref Rng & Units 01/30/2020 01/29/2020 12/19/2019  WBC 4.0 - 10.5 K/uL 9.2 4.9 3.8(L)  Hemoglobin 12.0 - 15.0 g/dL 3.2(G) 10.0(L) 9.3(L)  Hematocrit 36 - 46 % 26.9(L) 30.7(L) 28.5(L)  Platelets 150 - 400 K/uL 157 171 161   A POS  Physical exam:   Temp:  [98.1 F (36.7 C)-98.3 F (36.8 C)] 98.1 F (36.7 C) (08/31 0801) Pulse Rate:  [59-81] 65 (08/31 0801) Resp:  [16-20] 18 (08/31 0801) BP: (96-104)/(57-72) 96/58 (08/31 0801) SpO2:  [97 %-100 %] 100 % (08/31 0801)  General: alert and no distress  Lochia: appropriate  Abdomen: soft, NT  Uterine Fundus: firm  Extremities: No evidence of DVT seen on physical exam. No lower extremity edema.  Edinburgh Postnatal Depression Scale Screening Tool 01/30/2020 02/14/2019 02/14/2019  I have been able to laugh and see the funny side of things. 0 0 (No Data)  I have looked forward with enjoyment to things. 0 0 -  I have blamed myself unnecessarily when things went wrong. 0 1 -  I have been anxious or worried for no good reason. 0 2 -  I have felt scared or panicky for no good reason. 0 2 -  Things have been getting on top of me. 0 1 -  I have been so unhappy that  I have had difficulty sleeping. 0 0 -  I have felt sad or miserable. 0 0 -  I have been so unhappy that I have been crying. 0 0 -  The thought of harming myself has occurred to me. 0 0 -  Edinburgh Postnatal Depression Scale Total 0 6 -     Discharge Instructions: Per After Visit Summary.  Activity: Advance as tolerated. Pelvic rest for 6 weeks.  Also refer to After Visit Summary  Diet: Regular  Medications: Allergies as of 01/31/2020   No Known  Allergies     Medication List    STOP taking these medications   hydrOXYzine 25 MG tablet Commonly known as: ATARAX/VISTARIL   ondansetron 4 MG disintegrating tablet Commonly known as: Zofran ODT     TAKE these medications   acetaminophen 325 MG tablet Commonly known as: Tylenol Take 2 tablets (650 mg total) by mouth every 4 (four) hours as needed for mild pain.   coconut oil Oil Apply 1 application topically as needed.   cyclobenzaprine 5 MG tablet Commonly known as: FLEXERIL Take 1 tablet (5 mg total) by mouth at bedtime.   ferrous sulfate 325 (65 FE) MG tablet Take 1 tablet (325 mg total) by mouth 2 (two) times daily with a meal.   ibuprofen 600 MG tablet Commonly known as: ADVIL Take 1 tablet (600 mg total) by mouth every 6 (six) hours.            Discharge Care Instructions  (From admission, onward)         Start     Ordered   01/31/20 0000  Discharge wound care:       Comments: Keep pressure dressing on insertion site for the next 24 hours. Keep large bandage on for the next three (3) days. Change as needed.   01/31/20 1031         Outpatient follow up:   Follow-up Information    Gunnar Bulla, CNM Follow up.   Specialties: Certified Nurse Midwife, Obstetrics and Gynecology, Radiology Why: Please call the office to schedule your two (2) week televist and six (6) week postpartum visit Contact information: [address] [place] [address] [phone]              Postpartum contraception: Nexplanon, inserted prior to discharge  Discharged Condition: stable  Discharged to: home   Newborn Data: Disposition:home with mother  Apgars: APGAR (1 MIN): 8   APGAR (5 MINS): 9    Baby Feeding: Breast   Serafina Royals, CNM  [facility], [facility] 01/31/20 10:38 AM

## 2020-01-31 NOTE — Discharge Instructions (Signed)
Discharge Instructions:   Follow-up Appointment:  If there are any new medications, they have been ordered and will be available for pickup at the listed pharmacy on your way home from the hospital.   Call office if you have any of the following: headache, visual changes, fever >100.4 F, chills, shortness of breath, breast concerns, excessive vaginal bleeding, incision drainage or problems, leg pain or redness, depression or any other concerns. If you have vaginal discharge with an odor, let your doctor know.   It is normal to bleed for up to 6 weeks. You should not soak through more than 1 pad in 1 hour. If you have a blood clot larger than your fist with continued bleeding, call your doctor.   Activity: Do not lift > 10 lbs for 6 weeks (do not lift anything heavier than your baby). No intercourse, tampons, swimming pools, hot tubs, baths (only showers) for 6 weeks.  No driving for 1-2 weeks. Continue prenatal vitamin, especially if breastfeeding. Increase calories and fluids (water) while breastfeeding.   Your milk will come in, in the next couple of days (right now it is colostrum). You may have a slight fever when your milk comes in, but it should go away on its own.  If it does not, and rises above 101 F please call the doctor. You will also feel achy and your breasts will be firm. They will also start to leak. If you are breastfeeding, continue as you have been and you can pump/express milk for comfort.   If you have too much milk, your breasts can become engorged, which could lead to mastitis. This is an infection of the milk ducts. It can be very painful and you will need to notify your doctor to obtain a prescription for antibiotics. You can also treat it with a shower or hot/cold compress.   For concerns about your baby, please call your pediatrician.  For breastfeeding concerns, the lactation consultant can be reached at [phone].   Postpartum blues (feelings of happy one minute  and sad another minute) are normal for the first few weeks but if it gets worse let your doctor know.   Congratulations! We enjoyed caring for you and your new bundle of joy!      Breastfeeding  Choosing to breastfeed is one of the best decisions you can make for yourself and your baby. A change in hormones during pregnancy causes your breasts to make breast milk in your milk-producing glands. Hormones prevent breast milk from being released before your baby is born. They also prompt milk flow after birth. Once breastfeeding has begun, thoughts of your baby, as well as his or her sucking or crying, can stimulate the release of milk from your milk-producing glands. Benefits of breastfeeding Research shows that breastfeeding offers many health benefits for infants and mothers. It also offers a cost-free and convenient way to feed your baby. For your baby  Your first milk (colostrum) helps your baby's digestive system to function better.  Special cells in your milk (antibodies) help your baby to fight off infections.  Breastfed babies are less likely to develop asthma, allergies, obesity, or type 2 diabetes. They are also at lower risk for sudden infant death syndrome (SIDS).  Nutrients in breast milk are better able to meet your baby's needs compared to infant formula.  Breast milk improves your baby's brain development. For you  Breastfeeding helps to create a very special bond between you and your baby.  Breastfeeding is convenient.  Breast milk costs nothing and is always available at the correct temperature.  Breastfeeding helps to burn calories. It helps you to lose the weight that you gained during pregnancy.  Breastfeeding makes your uterus return faster to its size before pregnancy. It also slows bleeding (lochia) after you give birth.  Breastfeeding helps to lower your risk of developing type 2 diabetes, osteoporosis, rheumatoid arthritis, cardiovascular disease, and breast,  ovarian, uterine, and endometrial cancer later in life. Breastfeeding basics Starting breastfeeding  Find a comfortable place to sit or lie down, with your neck and back well-supported.  Place a pillow or a rolled-up blanket under your baby to bring him or her to the level of your breast (if you are seated). Nursing pillows are specially designed to help support your arms and your baby while you breastfeed.  Make sure that your baby's tummy (abdomen) is facing your abdomen.  Gently massage your breast. With your fingertips, massage from the outer edges of your breast inward toward the nipple. This encourages milk flow. If your milk flows slowly, you may need to continue this action during the feeding.  Support your breast with 4 fingers underneath and your thumb above your nipple (make the letter "C" with your hand). Make sure your fingers are well away from your nipple and your baby's mouth.  Stroke your baby's lips gently with your finger or nipple.  When your baby's mouth is open wide enough, quickly bring your baby to your breast, placing your entire nipple and as much of the areola as possible into your baby's mouth. The areola is the colored area around your nipple. ? More areola should be visible above your baby's upper lip than below the lower lip. ? Your baby's lips should be opened and extended outward (flanged) to ensure an adequate, comfortable latch. ? Your baby's tongue should be between his or her lower gum and your breast.  Make sure that your baby's mouth is correctly positioned around your nipple (latched). Your baby's lips should create a seal on your breast and be turned out (everted).  It is common for your baby to suck about 2-3 minutes in order to start the flow of breast milk. Latching Teaching your baby how to latch onto your breast properly is very important. An improper latch can cause nipple pain, decreased milk supply, and poor weight gain in your baby. Also, if  your baby is not latched onto your nipple properly, he or she may swallow some air during feeding. This can make your baby fussy. Burping your baby when you switch breasts during the feeding can help to get rid of the air. However, teaching your baby to latch on properly is still the best way to prevent fussiness from swallowing air while breastfeeding. Signs that your baby has successfully latched onto your nipple  Silent tugging or silent sucking, without causing you pain. Infant's lips should be extended outward (flanged).  Swallowing heard between every 3-4 sucks once your milk has started to flow (after your let-down milk reflex occurs).  Muscle movement above and in front of his or her ears while sucking. Signs that your baby has not successfully latched onto your nipple  Sucking sounds or smacking sounds from your baby while breastfeeding.  Nipple pain. If you think your baby has not latched on correctly, slip your finger into the corner of your baby's mouth to break the suction and place it between your baby's gums. Attempt to start breastfeeding again. Signs of successful breastfeeding  Signs from your baby  Your baby will gradually decrease the number of sucks or will completely stop sucking.  Your baby will fall asleep.  Your baby's body will relax.  Your baby will retain a small amount of milk in his or her mouth.  Your baby will let go of your breast by himself or herself. Signs from you  Breasts that have increased in firmness, weight, and size 1-3 hours after feeding.  Breasts that are softer immediately after breastfeeding.  Increased milk volume, as well as a change in milk consistency and color by the fifth day of breastfeeding.  Nipples that are not sore, cracked, or bleeding. Signs that your baby is getting enough milk  Wetting at least 1-2 diapers during the first 24 hours after birth.  Wetting at least 5-6 diapers every 24 hours for the first week after  birth. The urine should be clear or pale yellow by the age of 5 days.  Wetting 6-8 diapers every 24 hours as your baby continues to grow and develop.  At least 3 stools in a 24-hour period by the age of 5 days. The stool should be soft and yellow.  At least 3 stools in a 24-hour period by the age of 7 days. The stool should be seedy and yellow.  No loss of weight greater than 10% of birth weight during the first 3 days of life.  Average weight gain of 4-7 oz (113-198 g) per week after the age of 4 days.  Consistent daily weight gain by the age of 5 days, without weight loss after the age of 2 weeks. After a feeding, your baby may spit up a small amount of milk. This is normal. Breastfeeding frequency and duration Frequent feeding will help you make more milk and can prevent sore nipples and extremely full breasts (breast engorgement). Breastfeed when you feel the need to reduce the fullness of your breasts or when your baby shows signs of hunger. This is called "breastfeeding on demand." Signs that your baby is hungry include:  Increased alertness, activity, or restlessness.  Movement of the head from side to side.  Opening of the mouth when the corner of the mouth or cheek is stroked (rooting).  Increased sucking sounds, smacking lips, cooing, sighing, or squeaking.  Hand-to-mouth movements and sucking on fingers or hands.  Fussing or crying. Avoid introducing a pacifier to your baby in the first 4-6 weeks after your baby is born. After this time, you may choose to use a pacifier. Research has shown that pacifier use during the first year of a baby's life decreases the risk of sudden infant death syndrome (SIDS). Allow your baby to feed on each breast as long as he or she wants. When your baby unlatches or falls asleep while feeding from the first breast, offer the second breast. Because newborns are often sleepy in the first few weeks of life, you may need to awaken your baby to get  him or her to feed. Breastfeeding times will vary from baby to baby. However, the following rules can serve as a guide to help you make sure that your baby is properly fed:  Newborns (babies 34 weeks of age or younger) may breastfeed every 1-3 hours.  Newborns should not go without breastfeeding for longer than 3 hours during the day or 5 hours during the night.  You should breastfeed your baby a minimum of 8 times in a 24-hour period. Breast milk pumping     Pumping  and storing breast milk allows you to make sure that your baby is exclusively fed your breast milk, even at times when you are unable to breastfeed. This is especially important if you go back to work while you are still breastfeeding, or if you are not able to be present during feedings. Your lactation consultant can help you find a method of pumping that works best for you and give you guidelines about how long it is safe to store breast milk. Caring for your breasts while you breastfeed Nipples can become dry, cracked, and sore while breastfeeding. The following recommendations can help keep your breasts moisturized and healthy:  Avoid using soap on your nipples.  Wear a supportive bra designed especially for nursing. Avoid wearing underwire-style bras or extremely tight bras (sports bras).  Air-dry your nipples for 3-4 minutes after each feeding.  Use only cotton bra pads to absorb leaked breast milk. Leaking of breast milk between feedings is normal.  Use lanolin on your nipples after breastfeeding. Lanolin helps to maintain your skin's normal moisture barrier. Pure lanolin is not harmful (not toxic) to your baby. You may also hand express a few drops of breast milk and gently massage that milk into your nipples and allow the milk to air-dry. In the first few weeks after giving birth, some women experience breast engorgement. Engorgement can make your breasts feel heavy, warm, and tender to the touch. Engorgement peaks  within 3-5 days after you give birth. The following recommendations can help to ease engorgement:  Completely empty your breasts while breastfeeding or pumping. You may want to start by applying warm, moist heat (in the shower or with warm, water-soaked hand towels) just before feeding or pumping. This increases circulation and helps the milk flow. If your baby does not completely empty your breasts while breastfeeding, pump any extra milk after he or she is finished.  Apply ice packs to your breasts immediately after breastfeeding or pumping, unless this is too uncomfortable for you. To do this: ? Put ice in a plastic bag. ? Place a towel between your skin and the bag. ? Leave the ice on for 20 minutes, 2-3 times a day.  Make sure that your baby is latched on and positioned properly while breastfeeding. If engorgement persists after 48 hours of following these recommendations, contact your health care provider or a Science writer. Overall health care recommendations while breastfeeding  Eat 3 healthy meals and 3 snacks every day. Well-nourished mothers who are breastfeeding need an additional 450-500 calories a day. You can meet this requirement by increasing the amount of a balanced diet that you eat.  Drink enough water to keep your urine pale yellow or clear.  Rest often, relax, and continue to take your prenatal vitamins to prevent fatigue, stress, and low vitamin and mineral levels in your body (nutrient deficiencies).  Do not use any products that contain nicotine or tobacco, such as cigarettes and e-cigarettes. Your baby may be harmed by chemicals from cigarettes that pass into breast milk and exposure to secondhand smoke. If you need help quitting, ask your health care provider.  Avoid alcohol.  Do not use illegal drugs or marijuana.  Talk with your health care provider before taking any medicines. These include over-the-counter and prescription medicines as well as vitamins  and herbal supplements. Some medicines that may be harmful to your baby can pass through breast milk.  It is possible to become pregnant while breastfeeding. If birth control is desired, ask your  health care provider about options that will be safe while breastfeeding your baby. Where to find more information: [employer]: www.llli.org Contact a health care provider if:  You feel like you want to stop breastfeeding or have become frustrated with breastfeeding.  Your nipples are cracked or bleeding.  Your breasts are red, tender, or warm.  You have: ? Painful breasts or nipples. ? A swollen area on either breast. ? A fever or chills. ? Nausea or vomiting. ? Drainage other than breast milk from your nipples.  Your breasts do not become full before feedings by the fifth day after you give birth.  You feel sad and depressed.  Your baby is: ? Too sleepy to eat well. ? Having trouble sleeping. ? More than 30 week old and wetting fewer than 6 diapers in a 24-hour period. ? Not gaining weight by 72 days of age.  Your baby has fewer than 3 stools in a 24-hour period.  Your baby's skin or the white parts of his or her eyes become yellow. Get help right away if:  Your baby is overly tired (lethargic) and does not want to wake up and feed.  Your baby develops an unexplained fever. Summary  Breastfeeding offers many health benefits for infant and mothers.  Try to breastfeed your infant when he or she shows early signs of hunger.  Gently tickle or stroke your baby's lips with your finger or nipple to allow the baby to open his or her mouth. Bring the baby to your breast. Make sure that much of the areola is in your baby's mouth. Offer one side and burp the baby before you offer the other side.  Talk with your health care provider or lactation consultant if you have questions or you face problems as you breastfeed. This information is not intended to replace advice  given to you by your health care provider. Make sure you discuss any questions you have with your health care provider. Document Revised: 08/13/2017 Document Reviewed: 06/20/2016 Elsevier Patient Education  2020 [employer]  Postpartum Care After Vaginal Delivery This sheet gives you information about how to care for yourself from the time you deliver your baby to up to 6-12 weeks after delivery (postpartum period). Your health care provider may also give you more specific instructions. If you have problems or questions, contact your health care provider. Follow these instructions at home: Vaginal bleeding  It is normal to have vaginal bleeding (lochia) after delivery. Wear a sanitary pad for vaginal bleeding and discharge. ? During the first week after delivery, the amount and appearance of lochia is often similar to a menstrual period. ? Over the next few weeks, it will gradually decrease to a dry, yellow-brown discharge. ? For most women, lochia stops completely by 4-6 weeks after delivery. Vaginal bleeding can vary from woman to woman.  Change your sanitary pads frequently. Watch for any changes in your flow, such as: ? A sudden increase in volume. ? A change in color. ? Large blood clots.  If you pass a blood clot from your vagina, save it and call your health care provider to discuss. Do not flush blood clots down the toilet before talking with your health care provider.  Do not use tampons or douches until your health care provider says this is safe.  If you are not breastfeeding, your period should return 6-8 weeks after delivery. If you are feeding your child breast milk only (exclusive breastfeeding), your period may not  return until you stop breastfeeding. Perineal care  Keep the area between the vagina and the anus (perineum) clean and dry as told by your health care provider. Use medicated pads and pain-relieving sprays and creams as directed.  If you had a cut in the  perineum (episiotomy) or a tear in the vagina, check the area for signs of infection until you are healed. Check for: ? More redness, swelling, or pain. ? Fluid or blood coming from the cut or tear. ? Warmth. ? Pus or a bad smell.  You may be given a squirt bottle to use instead of wiping to clean the perineum area after you go to the bathroom. As you start healing, you may use the squirt bottle before wiping yourself. Make sure to wipe gently.  To relieve pain caused by an episiotomy, a tear in the vagina, or swollen veins in the anus (hemorrhoids), try taking a warm sitz bath 2-3 times a day. A sitz bath is a warm water bath that is taken while you are sitting down. The water should only come up to your hips and should cover your buttocks. Breast care  Within the first few days after delivery, your breasts may feel heavy, full, and uncomfortable (breast engorgement). Milk may also leak from your breasts. Your health care provider can suggest ways to help relieve the discomfort. Breast engorgement should go away within a few days.  If you are breastfeeding: ? Wear a bra that supports your breasts and fits you well. ? Keep your nipples clean and dry. Apply creams and ointments as told by your health care provider. ? You may need to use breast pads to absorb milk that leaks from your breasts. ? You may have uterine contractions every time you breastfeed for up to several weeks after delivery. Uterine contractions help your uterus return to its normal size. ? If you have any problems with breastfeeding, work with your health care provider or Advertising copywriter.  If you are not breastfeeding: ? Avoid touching your breasts a lot. Doing this can make your breasts produce more milk. ? Wear a good-fitting bra and use cold packs to help with swelling. ? Do not squeeze out (express) milk. This causes you to make more milk. Intimacy and sexuality  Ask your health care provider when you can engage  in sexual activity. This may depend on: ? Your risk of infection. ? How fast you are healing. ? Your comfort and desire to engage in sexual activity.  You are able to get pregnant after delivery, even if you have not had your period. If desired, talk with your health care provider about methods of birth control (contraception). Medicines  Take over-the-counter and prescription medicines only as told by your health care provider.  If you were prescribed an antibiotic medicine, take it as told by your health care provider. Do not stop taking the antibiotic even if you start to feel better. Activity  Gradually return to your normal activities as told by your health care provider. Ask your health care provider what activities are safe for you.  Rest as much as possible. Try to rest or take a nap while your baby is sleeping. Eating and drinking   Drink enough fluid to keep your urine pale yellow.  Eat high-fiber foods every day. These may help prevent or relieve constipation. High-fiber foods include: ? Whole grain cereals and breads. ? Brown rice. ? Beans. ? Fresh fruits and vegetables.  Do not try to lose  weight quickly by cutting back on calories.  Take your prenatal vitamins until your postpartum checkup or until your health care provider tells you it is okay to stop. Lifestyle  Do not use any products that contain nicotine or tobacco, such as cigarettes and e-cigarettes. If you need help quitting, ask your health care provider.  Do not drink alcohol, especially if you are breastfeeding. General instructions  Keep all follow-up visits for you and your baby as told by your health care provider. Most women visit their health care provider for a postpartum checkup within the first 3-6 weeks after delivery. Contact a health care provider if:  You feel unable to cope with the changes that your child brings to your life, and these feelings do not go away.  You feel unusually sad or  worried.  Your breasts become red, painful, or hard.  You have a fever.  You have trouble holding urine or keeping urine from leaking.  You have little or no interest in activities you used to enjoy.  You have not breastfed at all and you have not had a menstrual period for 12 weeks after delivery.  You have stopped breastfeeding and you have not had a menstrual period for 12 weeks after you stopped breastfeeding.  You have questions about caring for yourself or your baby.  You pass a blood clot from your vagina. Get help right away if:  You have chest pain.  You have difficulty breathing.  You have sudden, severe leg pain.  You have severe pain or cramping in your lower abdomen.  You bleed from your vagina so much that you fill more than one sanitary pad in one hour. Bleeding should not be heavier than your heaviest period.  You develop a severe headache.  You faint.  You have blurred vision or spots in your vision.  You have bad-smelling vaginal discharge.  You have thoughts about hurting yourself or your baby. If you ever feel like you may hurt yourself or others, or have thoughts about taking your own life, get help right away. You can go to the nearest emergency department or call:  Your local emergency services (911 in the [place]  A suicide crisis helpline, such as the National Suicide Prevention Lifeline at (570)081-94721-(973)493-1470. This is open 24 hours a day. Summary  The period of time right after you deliver your newborn up to 6-12 weeks after delivery is called the postpartum period.  Gradually return to your normal activities as told by your health care provider.  Keep all follow-up visits for you and your baby as told by your health care provider. This information is not intended to replace advice given to you by your health care provider. Make sure you discuss any questions you have with your health care provider. Document Revised: 05/22/2017 Document Reviewed:  03/02/2017 Elsevier Patient Education  2020 [employer]    Postpartum Baby Blues The postpartum period begins right after the birth of a baby. During this time, there is often a lot of joy and excitement. It is also a time of many changes in the life of the parents. No matter how many times a mother gives birth, each child brings new challenges to the family, including different ways of relating to one another. It is common to have feelings of excitement along with confusing changes in moods, emotions, and thoughts. You may feel happy one minute and sad or stressed the next. These feelings of sadness usually happen in the period right  after you have your baby, and they go away within a week or two. This is called the "baby blues." What are the causes? There is no known cause of baby blues. It is likely caused by a combination of factors. However, changes in hormone levels after childbirth are believed to trigger some of the symptoms. Other factors that can play a role in these mood changes include:  Lack of sleep.  Stressful life events, such as poverty, caring for a loved one, or death of a loved one.  Genetics. What are the signs or symptoms? Symptoms of this condition include:  Brief changes in mood, such as going from extreme happiness to sadness.  Decreased concentration.  Difficulty sleeping.  Crying spells and tearfulness.  Loss of appetite.  Irritability.  Anxiety. If the symptoms of baby blues last for more than 2 weeks or become more severe, you may have postpartum depression. How is this diagnosed? This condition is diagnosed based on an evaluation of your symptoms. There are no medical or lab tests that lead to a diagnosis, but there are various questionnaires that a health care provider may use to identify women with the baby blues or postpartum depression. How is this treated? Treatment is not needed for this condition. The baby blues usually go away on their own  in 1-2 weeks. Social support is often all that is needed. You will be encouraged to get adequate sleep and rest. Follow these instructions at home: Lifestyle      Get as much rest as you can. Take a nap when the baby sleeps.  Exercise regularly as told by your health care provider. Some women find yoga and walking to be helpful.  Eat a balanced and nourishing diet. This includes plenty of fruits and vegetables, whole grains, and lean proteins.  Do little things that you enjoy. Have a cup of tea, take a bubble bath, read your favorite magazine, or listen to your favorite music.  Avoid alcohol.  Ask for help with household chores, cooking, grocery shopping, or running errands. Do not try to do everything yourself. Consider hiring a postpartum doula to help. This is a professional who specializes in providing support to new mothers.  Try not to make any major life changes during pregnancy or right after giving birth. This can add stress. General instructions  Talk to people close to you about how you are feeling. Get support from your partner, family members, friends, or other new moms. You may want to join a support group.  Find ways to cope with stress. This may include: ? Writing your thoughts and feelings in a journal. ? Spending time outside. ? Spending time with people who make you laugh.  Try to stay positive in how you think. Think about the things you are grateful for.  Take over-the-counter and prescription medicines only as told by your health care provider.  Let your health care provider know if you have any concerns.  Keep all postpartum visits as told by your health care provider. This is important. Contact a health care provider if:  Your baby blues do not go away after 2 weeks. Get help right away if:  You have thoughts of taking your own life (suicidal thoughts).  You think you may harm the baby or other people.  You see or hear things that are not there  (hallucinations). Summary  After giving birth, you may feel happy one minute and sad or stressed the next. Feelings of sadness that happen  right after the baby is born and go away after a week or two are called the "baby blues."  You can manage the baby blues by getting enough rest, eating a healthy diet, exercising, spending time with supportive people, and finding ways to cope with stress.  If feelings of sadness and stress last longer than 2 weeks or get in the way of caring for your baby, talk to your health care provider. This may mean you have postpartum depression. This information is not intended to replace advice given to you by your health care provider. Make sure you discuss any questions you have with your health care provider. Document Revised: 09/10/2018 Document Reviewed: 07/15/2016 Elsevier Patient Education  2020 [employer].   Etonogestrel implant What is this medicine? ETONOGESTREL (et oh noe JES trel) is a contraceptive (birth control) device. It is used to prevent pregnancy. It can be used for up to 3 years. This medicine may be used for other purposes; ask your health care provider or pharmacist if you have questions. COMMON BRAND NAME(S): Implanon, Nexplanon What should I tell my health care provider before I take this medicine? They need to know if you have any of these conditions:  abnormal vaginal bleeding  blood vessel disease or blood clots  breast, cervical, endometrial, ovarian, liver, or uterine cancer  diabetes  gallbladder disease  heart disease or recent heart attack  high blood pressure  high cholesterol or triglycerides  kidney disease  liver disease  migraine headaches  seizures  stroke  tobacco smoker  an unusual or allergic reaction to etonogestrel, anesthetics or antiseptics, other medicines, foods, dyes, or preservatives  pregnant or trying to get pregnant  breast-feeding How should I use this medicine? This device is  inserted just under the skin on the inner side of your upper arm by a health care professional. Talk to your pediatrician regarding the use of this medicine in children. Special care may be needed. Overdosage: If you think you have taken too much of this medicine contact a poison control center or emergency room at once. NOTE: This medicine is only for you. Do not share this medicine with others. What if I miss a dose? This does not apply. What may interact with this medicine? Do not take this medicine with any of the following medications:  amprenavir  fosamprenavir This medicine may also interact with the following medications:  acitretin  aprepitant  armodafinil  bexarotene  bosentan  carbamazepine  certain medicines for fungal infections like fluconazole, ketoconazole, itraconazole and voriconazole  certain medicines to treat hepatitis, HIV or AIDS  cyclosporine  felbamate  griseofulvin  lamotrigine  modafinil  oxcarbazepine  phenobarbital  phenytoin  primidone  rifabutin  rifampin  rifapentine  St. John's wort  topiramate This list may not describe all possible interactions. Give your health care provider a list of all the medicines, herbs, non-prescription drugs, or dietary supplements you use. Also tell them if you smoke, drink alcohol, or use illegal drugs. Some items may interact with your medicine. What should I watch for while using this medicine? This product does not protect you against HIV infection (AIDS) or other sexually transmitted diseases. You should be able to feel the implant by pressing your fingertips over the skin where it was inserted. Contact your doctor if you cannot feel the implant, and use a non-hormonal birth control method (such as condoms) until your doctor confirms that the implant is in place. Contact your doctor if you think that the  implant may have broken or become bent while in your arm. You will receive a user card  from your health care provider after the implant is inserted. The card is a record of the location of the implant in your upper arm and when it should be removed. Keep this card with your health records. What side effects may I notice from receiving this medicine? Side effects that you should report to your doctor or health care professional as soon as possible:  allergic reactions like skin rash, itching or hives, swelling of the face, lips, or tongue  breast lumps, breast tissue changes, or discharge  breathing problems  changes in emotions or moods  coughing up blood  if you feel that the implant may have broken or bent while in your arm  high blood pressure  pain, irritation, swelling, or bruising at the insertion site  scar at site of insertion  signs of infection at the insertion site such as fever, and skin redness, pain or discharge  signs and symptoms of a blood clot such as breathing problems; changes in vision; chest pain; severe, sudden headache; pain, swelling, warmth in the leg; trouble speaking; sudden numbness or weakness of the face, arm or leg  signs and symptoms of liver injury like dark yellow or brown urine; general ill feeling or flu-like symptoms; light-colored stools; loss of appetite; nausea; right upper belly pain; unusually weak or tired; yellowing of the eyes or skin  unusual vaginal bleeding, discharge Side effects that usually do not require medical attention (report to your doctor or health care professional if they continue or are bothersome):  acne  breast pain or tenderness  headache  irregular menstrual bleeding  nausea This list may not describe all possible side effects. Call your doctor for medical advice about side effects. You may report side effects to FDA at 1-800-FDA-1088. Where should I keep my medicine? This drug is given in a hospital or clinic and will not be stored at home. NOTE: This sheet is a summary. It may not cover all  possible information. If you have questions about this medicine, talk to your doctor, pharmacist, or health care provider.  2020 Elsevier/Gold Standard (2019-03-01 11:33:04)    Etonogestrel implant What is this medicine? ETONOGESTREL (et oh noe JES trel) is a contraceptive (birth control) device. It is used to prevent pregnancy. It can be used for up to 3 years. This medicine may be used for other purposes; ask your health care provider or pharmacist if you have questions. COMMON BRAND NAME(S): Implanon, Nexplanon What should I tell my health care provider before I take this medicine? They need to know if you have any of these conditions:  abnormal vaginal bleeding  blood vessel disease or blood clots  breast, cervical, endometrial, ovarian, liver, or uterine cancer  diabetes  gallbladder disease  heart disease or recent heart attack  high blood pressure  high cholesterol or triglycerides  kidney disease  liver disease  migraine headaches  seizures  stroke  tobacco smoker  an unusual or allergic reaction to etonogestrel, anesthetics or antiseptics, other medicines, foods, dyes, or preservatives  pregnant or trying to get pregnant  breast-feeding How should I use this medicine? This device is inserted just under the skin on the inner side of your upper arm by a health care professional. Talk to your pediatrician regarding the use of this medicine in children. Special care may be needed. Overdosage: If you think you have taken too much of this  medicine contact a poison control center or emergency room at once. NOTE: This medicine is only for you. Do not share this medicine with others. What if I miss a dose? This does not apply. What may interact with this medicine? Do not take this medicine with any of the following medications:  amprenavir  fosamprenavir This medicine may also interact with the following  medications:  acitretin  aprepitant  armodafinil  bexarotene  bosentan  carbamazepine  certain medicines for fungal infections like fluconazole, ketoconazole, itraconazole and voriconazole  certain medicines to treat hepatitis, HIV or AIDS  cyclosporine  felbamate  griseofulvin  lamotrigine  modafinil  oxcarbazepine  phenobarbital  phenytoin  primidone  rifabutin  rifampin  rifapentine  St. John's wort  topiramate This list may not describe all possible interactions. Give your health care provider a list of all the medicines, herbs, non-prescription drugs, or dietary supplements you use. Also tell them if you smoke, drink alcohol, or use illegal drugs. Some items may interact with your medicine. What should I watch for while using this medicine? This product does not protect you against HIV infection (AIDS) or other sexually transmitted diseases. You should be able to feel the implant by pressing your fingertips over the skin where it was inserted. Contact your doctor if you cannot feel the implant, and use a non-hormonal birth control method (such as condoms) until your doctor confirms that the implant is in place. Contact your doctor if you think that the implant may have broken or become bent while in your arm. You will receive a user card from your health care provider after the implant is inserted. The card is a record of the location of the implant in your upper arm and when it should be removed. Keep this card with your health records. What side effects may I notice from receiving this medicine? Side effects that you should report to your doctor or health care professional as soon as possible:  allergic reactions like skin rash, itching or hives, swelling of the face, lips, or tongue  breast lumps, breast tissue changes, or discharge  breathing problems  changes in emotions or moods  coughing up blood  if you feel that the implant may have broken  or bent while in your arm  high blood pressure  pain, irritation, swelling, or bruising at the insertion site  scar at site of insertion  signs of infection at the insertion site such as fever, and skin redness, pain or discharge  signs and symptoms of a blood clot such as breathing problems; changes in vision; chest pain; severe, sudden headache; pain, swelling, warmth in the leg; trouble speaking; sudden numbness or weakness of the face, arm or leg  signs and symptoms of liver injury like dark yellow or brown urine; general ill feeling or flu-like symptoms; light-colored stools; loss of appetite; nausea; right upper belly pain; unusually weak or tired; yellowing of the eyes or skin  unusual vaginal bleeding, discharge Side effects that usually do not require medical attention (report to your doctor or health care professional if they continue or are bothersome):  acne  breast pain or tenderness  headache  irregular menstrual bleeding  nausea This list may not describe all possible side effects. Call your doctor for medical advice about side effects. You may report side effects to FDA at 1-800-FDA-1088. Where should I keep my medicine? This drug is given in a hospital or clinic and will not be stored at home. NOTE: This sheet  is a summary. It may not cover all possible information. If you have questions about this medicine, talk to your doctor, pharmacist, or health care provider.  2020 Elsevier/Gold Standard (2019-03-01 11:33:04)

## 2020-01-31 NOTE — Telephone Encounter (Signed)
Couldn't get pt the number stated that it was unreachable. Sent pt a [employer] ge[facility]

## 2020-01-31 NOTE — Telephone Encounter (Signed)
-----   Message from Gunnar Bulla, CNM sent at 01/29/2020  2:42 PM EDT ----- Please cancel remaining prenatal care appointments.   Schedule two (2) week TELEVISIT with JML.   Schedule six (6) week postpartum visit with JML.

## 2020-01-31 NOTE — Progress Notes (Signed)
D/C order from MD.  Reviewed d/c instructions and prescriptions with patient and answered any questions.  Patient d/c home with infant via wheelchair by nursing/auxillary. 

## 2020-02-03 ENCOUNTER — Encounter: Payer: Medicaid Other | Admitting: Certified Nurse Midwife

## 2020-02-17 ENCOUNTER — Ambulatory Visit (INDEPENDENT_AMBULATORY_CARE_PROVIDER_SITE_OTHER): Payer: Medicaid Other | Admitting: Certified Nurse Midwife

## 2020-02-17 ENCOUNTER — Other Ambulatory Visit: Payer: Self-pay

## 2020-02-17 ENCOUNTER — Encounter: Payer: Self-pay | Admitting: Certified Nurse Midwife

## 2020-02-17 DIAGNOSIS — Z1331 Encounter for screening for depression: Secondary | ICD-10-CM | POA: Diagnosis not present

## 2020-02-17 NOTE — Progress Notes (Signed)
Virtual Visit via Telephone Note  I connected with Yolanda Garcia on 02/17/20 at 11:15 AM EDT by telephone and verified that I am speaking with the correct person using two identifiers.  Location:  Patient: Yolanda Garcia (home)  Provider: Serafina Royals, CNM ([facility], [facility])   I discussed the limitations, risks, security and privacy concerns of performing an evaluation and management service by telephone and the availability of in person appointments. I also discussed with the patient that there may be a patient responsible charge related to this service. The patient expressed understanding and agreed to proceed.   History of Present Illness:  Patient is two (2) weeks after spontaneous vaginal birth of female infant.   Doing well overall, no longer breastfeeding.   Bleeding is light. Bowel and bladder habits are normal.   Eating and sleeping well. Happy with weight gain.   Son "warming up" to baby sister.   No negative side effects from Nexplanon.   Denies difficulty breathing or respiratory distress, chest pain, abdominal pain, excessive vaginal bleeding, dysuria, and leg pain or swelling.    Observations/Objective:  Depression screen [facility] 2/9 02/17/2020  Decreased Interest 0  Down, Depressed, Hopeless 0  PHQ - 2 Score 0  Altered sleeping 0  Tired, decreased energy 0  Change in appetite 0  Feeling bad or failure about yourself  0  Trouble concentrating 0  Moving slowly or fidgety/restless 0  Suicidal thoughts 0  PHQ-9 Score 0   Assessment and Plan:  Postpartum care and examination Depression screening negative  Follow Up Instructions:  Reviewed red flag symptoms and when to call.   RTC on 03/15/2020 at 1545 for six (6) week check up or sooner if needed.   I discussed the assessment and treatment plan with the patient. The patient was provided an opportunity to ask questions and all were answered. The patient agreed with the plan and  demonstrated an understanding of the instructions.   The patient was advised to call back or seek an in-person evaluation if the symptoms worsen or if the condition fails to improve as anticipated.  I provided 6 minutes of non-face-to-face time during this encounter.   Serafina Royals, CNM [facility], [facility] 02/17/20 11:47 AM

## 2020-03-15 ENCOUNTER — Encounter: Payer: Medicaid Other | Admitting: Certified Nurse Midwife

## 2020-03-23 ENCOUNTER — Encounter: Payer: Medicaid Other | Admitting: Certified Nurse Midwife

## 2020-03-26 ENCOUNTER — Telehealth: Payer: Self-pay

## 2020-03-26 ENCOUNTER — Encounter: Payer: Self-pay | Admitting: Certified Nurse Midwife

## 2020-03-26 ENCOUNTER — Ambulatory Visit (INDEPENDENT_AMBULATORY_CARE_PROVIDER_SITE_OTHER): Payer: Medicaid Other | Admitting: Certified Nurse Midwife

## 2020-03-26 ENCOUNTER — Other Ambulatory Visit: Payer: Self-pay

## 2020-03-26 DIAGNOSIS — Z1331 Encounter for screening for depression: Secondary | ICD-10-CM

## 2020-03-26 DIAGNOSIS — R636 Underweight: Secondary | ICD-10-CM | POA: Insufficient documentation

## 2020-03-26 DIAGNOSIS — Z975 Presence of (intrauterine) contraceptive device: Secondary | ICD-10-CM

## 2020-03-26 NOTE — Telephone Encounter (Signed)
Spoke to [place] from [facility]. States she will fax the report over.

## 2020-03-26 NOTE — Patient Instructions (Signed)
Etonogestrel implant What is this medicine? ETONOGESTREL (et oh noe JES trel) is a contraceptive (birth control) device. It is used to prevent pregnancy. It can be used for up to 3 years. This medicine may be used for other purposes; ask your health care provider or pharmacist if you have questions. COMMON BRAND NAME(S): Implanon, Nexplanon What should I tell my health care provider before I take this medicine? They need to know if you have any of these conditions:  abnormal vaginal bleeding  blood vessel disease or blood clots  breast, cervical, endometrial, ovarian, liver, or uterine cancer  diabetes  gallbladder disease  heart disease or recent heart attack  high blood pressure  high cholesterol or triglycerides  kidney disease  liver disease  migraine headaches  seizures  stroke  tobacco smoker  an unusual or allergic reaction to etonogestrel, anesthetics or antiseptics, other medicines, foods, dyes, or preservatives  pregnant or trying to get pregnant  breast-feeding How should I use this medicine? This device is inserted just under the skin on the inner side of your upper arm by a health care professional. Talk to your pediatrician regarding the use of this medicine in children. Special care may be needed. Overdosage: If you think you have taken too much of this medicine contact a poison control center or emergency room at once. NOTE: This medicine is only for you. Do not share this medicine with others. What if I miss a dose? This does not apply. What may interact with this medicine? Do not take this medicine with any of the following medications:  amprenavir  fosamprenavir This medicine may also interact with the following medications:  acitretin  aprepitant  armodafinil  bexarotene  bosentan  carbamazepine  certain medicines for fungal infections like fluconazole, ketoconazole, itraconazole and voriconazole  certain medicines to treat  hepatitis, HIV or AIDS  cyclosporine  felbamate  griseofulvin  lamotrigine  modafinil  oxcarbazepine  phenobarbital  phenytoin  primidone  rifabutin  rifampin  rifapentine  St. John's wort  topiramate This list may not describe all possible interactions. Give your health care provider a list of all the medicines, herbs, non-prescription drugs, or dietary supplements you use. Also tell them if you smoke, drink alcohol, or use illegal drugs. Some items may interact with your medicine. What should I watch for while using this medicine? This product does not protect you against HIV infection (AIDS) or other sexually transmitted diseases. You should be able to feel the implant by pressing your fingertips over the skin where it was inserted. Contact your doctor if you cannot feel the implant, and use a non-hormonal birth control method (such as condoms) until your doctor confirms that the implant is in place. Contact your doctor if you think that the implant may have broken or become bent while in your arm. You will receive a user card from your health care provider after the implant is inserted. The card is a record of the location of the implant in your upper arm and when it should be removed. Keep this card with your health records. What side effects may I notice from receiving this medicine? Side effects that you should report to your doctor or health care professional as soon as possible:  allergic reactions like skin rash, itching or hives, swelling of the face, lips, or tongue  breast lumps, breast tissue changes, or discharge  breathing problems  changes in emotions or moods  coughing up blood  if you feel that the implant  may have broken or bent while in your arm  high blood pressure  pain, irritation, swelling, or bruising at the insertion site  scar at site of insertion  signs of infection at the insertion site such as fever, and skin redness, pain or  discharge  signs and symptoms of a blood clot such as breathing problems; changes in vision; chest pain; severe, sudden headache; pain, swelling, warmth in the leg; trouble speaking; sudden numbness or weakness of the face, arm or leg  signs and symptoms of liver injury like dark yellow or brown urine; general ill feeling or flu-like symptoms; light-colored stools; loss of appetite; nausea; right upper belly pain; unusually weak or tired; yellowing of the eyes or skin  unusual vaginal bleeding, discharge Side effects that usually do not require medical attention (report to your doctor or health care professional if they continue or are bothersome):  acne  breast pain or tenderness  headache  irregular menstrual bleeding  nausea This list may not describe all possible side effects. Call your doctor for medical advice about side effects. You may report side effects to FDA at 1-800-FDA-1088. Where should I keep my medicine? This drug is given in a hospital or clinic and will not be stored at home. NOTE: This sheet is a summary. It may not cover all possible information. If you have questions about this medicine, talk to your doctor, pharmacist, or health care provider.  2020 Elsevier/Gold Standard (2019-03-01 11:33:04) Preventive Care 21-39 Years Old, Female Preventive care refers to visits with your health care provider and lifestyle choices that can promote health and wellness. This includes:  A yearly physical exam. This may also be called an annual well check.  Regular dental visits and eye exams.  Immunizations.  Screening for certain conditions.  Healthy lifestyle choices, such as eating a healthy diet, getting regular exercise, not using drugs or products that contain nicotine and tobacco, and limiting alcohol use. What can I expect for my preventive care visit? Physical exam Your health care provider will check your:  Height and weight. This may be used to calculate body  mass index (BMI), which tells if you are at a healthy weight.  Heart rate and blood pressure.  Skin for abnormal spots. Counseling Your health care provider may ask you questions about your:  Alcohol, tobacco, and drug use.  Emotional well-being.  Home and relationship well-being.  Sexual activity.  Eating habits.  Work and work environment.  Method of birth control.  Menstrual cycle.  Pregnancy history. What immunizations do I need?  Influenza (flu) vaccine  This is recommended every year. Tetanus, diphtheria, and pertussis (Tdap) vaccine  You may need a Td booster every 10 years. Varicella (chickenpox) vaccine  You may need this if you have not been vaccinated. Human papillomavirus (HPV) vaccine  If recommended by your health care provider, you may need three doses over 6 months. Measles, mumps, and rubella (MMR) vaccine  You may need at least one dose of MMR. You may also need a second dose. Meningococcal conjugate (MenACWY) vaccine  One dose is recommended if you are age 19-21 years and a first-year college student living in a residence hall, or if you have one of several medical conditions. You may also need additional booster doses. Pneumococcal conjugate (PCV13) vaccine  You may need this if you have certain conditions and were not previously vaccinated. Pneumococcal polysaccharide (PPSV23) vaccine  You may need one or two doses if you smoke cigarettes or if you   if you have certain conditions. Hepatitis A vaccine  You may need this if you have certain conditions or if you travel or work in places where you may be exposed to hepatitis A. Hepatitis B vaccine  You may need this if you have certain conditions or if you travel or work in places where you may be exposed to hepatitis B. Haemophilus influenzae type b (Hib) vaccine  You may need this if you have certain conditions. You may receive vaccines as individual doses or as more than one vaccine  together in one shot (combination vaccines). Talk with your health care provider about the risks and benefits of combination vaccines. What tests do I need?  Blood tests  Lipid and cholesterol levels. These may be checked every 5 years starting at age 32.  Hepatitis C test.  Hepatitis B test. Screening  Diabetes screening. This is done by checking your blood sugar (glucose) after you have not eaten for a while (fasting).  Sexually transmitted disease (STD) testing.  BRCA-related cancer screening. This may be done if you have a family history of breast, ovarian, tubal, or peritoneal cancers.  Pelvic exam and Pap test. This may be done every 3 years starting at age 52. Starting at age 74, this may be done every 5 years if you have a Pap test in combination with an HPV test. Talk with your health care provider about your test results, treatment options, and if necessary, the need for more tests. Follow these instructions at home: Eating and drinking   Eat a diet that includes fresh fruits and vegetables, whole grains, lean protein, and low-fat dairy.  Take vitamin and mineral supplements as recommended by your health care provider.  Do not drink alcohol if: ? Your health care provider tells you not to drink. ? You are pregnant, may be pregnant, or are planning to become pregnant.  If you drink alcohol: ? Limit how much you have to 0-1 drink a day. ? Be aware of how much alcohol is in your drink. In the [place] one drink equals one 12 oz bottle of beer (355 mL), one 5 oz glass of wine (148 mL), or one 1 oz glass of hard liquor (44 mL). Lifestyle  Take daily care of your teeth and gums.  Stay active. Exercise for at least 30 minutes on 5 or more days each week.  Do not use any products that contain nicotine or tobacco, such as cigarettes, e-cigarettes, and chewing tobacco. If you need help quitting, ask your health care provider.  If you are sexually active, practice safe sex.  Use a condom or other form of birth control (contraception) in order to prevent pregnancy and STIs (sexually transmitted infections). If you plan to become pregnant, see your health care provider for a preconception visit. What's next?  Visit your health care provider once a year for a well check visit.  Ask your health care provider how often you should have your eyes and teeth checked.  Stay up to date on all vaccines. This information is not intended to replace advice given to you by your health care provider. Make sure you discuss any questions you have with your health care provider. Document Revised: 01/28/2018 Document Reviewed: 01/28/2018 Elsevier Patient Education  2020 [employer].

## 2020-03-26 NOTE — Progress Notes (Addendum)
Subjective:    Yolanda Garcia is a 24 y.o. G77P1102 African American female who presents for a postpartum visit. She is 8 weeks postpartum following a spontaneous vaginal delivery at 38+3 gestational weeks. Anesthesia: IV sedation. I have fully reviewed the prenatal and intrapartum course.   Postpartum course has been uncomplicated.   Baby's course has been uncomplicated. Baby is feeding by formula.  Bleeding no bleeding. Bowel function is normal. Bladder function is normal.   Patient is sexually active. Contraception method is Nexplanon.   Postpartum depression screening: negative. Score 2.    Last pap 09/2019 and was results were normal.  Denies difficulty breathing or respiratory distress, chest pain, abdominal pain, dysuria, and leg pain or swelling.   The following portions of the patient's history were reviewed and updated as appropriate: allergies, current medications, past medical history, past surgical history and problem list.  Review of Systems  Pertinent items are noted in HPI.   Objective:   BP 95/65   Pulse 71   Ht 5\' 6"  (1.676 m)   Wt 103 lb 3.2 oz (46.8 kg)   LMP 05/27/2019 Comment: 2 periods in Dec  Breastfeeding No   BMI 16.66 kg/m   General:  Alert, cooperative and no distress   Breasts:  Deferred, no complaints  Lungs: Clear to auscultation bilaterally  Heart:  Regular rate and rhythm  Abdomen: Soft, nontender   Pelvic Exam: Declined by patient   Depression screen [facility] 2/9 03/26/2020 02/17/2020  Decreased Interest 0 0  Down, Depressed, Hopeless 1 0  PHQ - 2 Score 1 0  Altered sleeping 1 0  Tired, decreased energy 0 0  Change in appetite 0 0  Feeling bad or failure about yourself  0 0  Trouble concentrating 0 0  Moving slowly or fidgety/restless 0 0  Suicidal thoughts 0 0  PHQ-9 Score 2 0   GAD 7 : Generalized Anxiety Score 03/26/2020  Nervous, Anxious, on Edge 1  Control/stop worrying 0  Worry too much - different things 0  Trouble  relaxing 1  Restless 0  Easily annoyed or irritable 0  Afraid - awful might happen 0  Total GAD 7 Score 2         Assessment:   Postpartum exam Eight (8) wks s/p spontaneous vaginal birth Formula feeding Depression screening negative Nexplanon in place  Plan:   Encouraged routine health maintenance techniques.   BMI 16; Referral to MNT, see orders.   [facility] handouts provided.   Reviewed red flag symptoms and when to call.   Follow up in: 6 months for ANNUAL EXAM or earlier if needed.   03/28/2020, CNM [facility], [facility] 03/26/20 2:16 PM

## 2020-04-04 ENCOUNTER — Other Ambulatory Visit: Payer: Self-pay

## 2020-04-04 ENCOUNTER — Encounter: Payer: Medicaid Other | Attending: Certified Nurse Midwife | Admitting: Dietician

## 2020-05-04 ENCOUNTER — Encounter: Payer: Medicaid Other | Admitting: Certified Nurse Midwife

## 2020-07-02 ENCOUNTER — Other Ambulatory Visit: Payer: Self-pay

## 2020-07-02 ENCOUNTER — Other Ambulatory Visit (HOSPITAL_COMMUNITY)
Admission: RE | Admit: 2020-07-02 | Discharge: 2020-07-02 | Disposition: A | Discharge status: Home / Self Care | Payer: Medicaid Other | Source: Ambulatory Visit | Attending: Certified Nurse Midwife | Admitting: Certified Nurse Midwife

## 2020-07-02 ENCOUNTER — Ambulatory Visit (INDEPENDENT_AMBULATORY_CARE_PROVIDER_SITE_OTHER): Payer: Medicaid Other | Admitting: Certified Nurse Midwife

## 2020-07-02 ENCOUNTER — Encounter: Payer: Self-pay | Admitting: Certified Nurse Midwife

## 2020-07-02 VITALS — BP 99/78 | HR 75 | Ht 66.0 in | Wt 100.9 lb

## 2020-07-02 DIAGNOSIS — N898 Other specified noninflammatory disorders of vagina: Secondary | ICD-10-CM

## 2020-07-02 DIAGNOSIS — Z113 Encounter for screening for infections with a predominantly sexual mode of transmission: Secondary | ICD-10-CM

## 2020-07-02 DIAGNOSIS — Z975 Presence of (intrauterine) contraceptive device: Secondary | ICD-10-CM | POA: Diagnosis not present

## 2020-07-02 NOTE — Progress Notes (Signed)
GYN ENCOUNTER NOTE  Subjective:       Yolanda Garcia is a 25 y.o. G62P1102 female is here for gynecologic evaluation of the following issues:  1. Yellow and white vaginal discharge for the last few days with wiping 2. Pain at Nexplanon insertion site  Denies difficulty breathing or respiratory distress, chest pain, abdominal pain, excessive vaginal bleeding, dysuria, and leg pain or swelling.    Gynecologic History  No LMP recorded (lmp unknown). Patient has had an implant.  Contraception: Nexplanon  Last Pap: 270350. Results were: normal  Obstetric History  OB History  Gravida Para Term Preterm AB Living  2 2 1 1   2   SAB IAB Ectopic Multiple Live Births        0 2    # Outcome Date GA Lbr Len/2nd Weight Sex Delivery Anes PTL Lv  2 Term 01/29/20 [redacted]w[redacted]d 1180:41 / 00:03 6 lb 4.9 oz (2.86 kg) F Vag-Spont None  LIV  1 Preterm 02/14/19 [redacted]w[redacted]d / 00:22 5 lb 11 oz (2.58 kg) M Vag-Spont None  LIV    Past Medical History:  Diagnosis Date  . Anemia   . Panic attack     Past Surgical History:  Procedure Laterality Date  . NO PAST SURGERIES      Current Outpatient Medications on File Prior to Visit  Medication Sig Dispense Refill  . etonogestrel (NEXPLANON) 68 MG IMPL implant 1 each by Subdermal route once.     No current facility-administered medications on file prior to visit.    No Known Allergies  Social History   Socioeconomic History  . Marital status: Single    Spouse name: Not on file  . Number of children: Not on file  . Years of education: Not on file  . Highest education level: Not on file  Occupational History  . Not on file  Tobacco Use  . Smoking status: Never Smoker  . Smokeless tobacco: Never Used  Vaping Use  . Vaping Use: Never used  Substance and Sexual Activity  . Alcohol use: No  . Drug use: Never  . Sexual activity: Yes    Birth control/protection: Implant  Other Topics Concern  . Not on file  Social History Narrative  . Not on file    Social Determinants of Health   Financial Resource Strain: Not on file  Food Insecurity: Not on file  Transportation Needs: Not on file  Physical Activity: Not on file  Stress: Not on file  Social Connections: Not on file  Intimate Partner Violence: Not on file    Family History  Problem Relation Age of Onset  . Healthy Mother   . Healthy Father   . Healthy Maternal Grandmother   . Healthy Maternal Grandfather   . Healthy Paternal Grandmother   . Healthy Paternal Grandfather     The following portions of the patient's history were reviewed and updated as appropriate: allergies, current medications, past family history, past medical history, past social history, past surgical history and problem list.  Review of Systems  ROS negative except as noted above. Information obtained from patient.   Objective:   BP 99/78   Pulse 75   Ht 5\' 6"  (1.676 m)   Wt 100 lb 14.4 oz (45.8 kg)   LMP  (LMP Unknown)   BMI 16.29 kg/m   CONSTITUTIONAL: Well-developed, well-nourished female in no acute distress.   SKIN: Skin is warm and dry. No rash noted. Not diaphoretic. No erythema. Nexplanon present in left arm,  non-tender.   PELVIC:  External Genitalia: Normal  Vagina: Normal, vaginal swab collected  MUSCULOSKELETAL: Normal range of motion. No tenderness.  No cyanosis, clubbing, or edema.   Assessment:   1. Vaginal discharge  - Cervicovaginal ancillary only  2. Routine screening for STI (sexually transmitted infection)  - Cervicovaginal ancillary only  3. Nexplanon in place   Plan:   Vaginal swab collected, see orders.   Nexplanon education provided.   Reviewed red flag symptoms and when to call.   RTC as previously scheduled or sooner if needed.    Serafina Royals, CNM [facility], [facility] 07/02/20 3:12 PM

## 2020-07-02 NOTE — Progress Notes (Signed)
Pt present for irregular discharge. Pt stated having irregular discharge that is white and thick at times.  Pt also stated having pain in her arm with the Nexplanon.

## 2020-07-02 NOTE — Patient Instructions (Signed)
Safe Sex Practicing safe sex means taking steps before and during sex to reduce your risk of:  Getting an STI (sexually transmitted infection).  Giving your partner an STI.  Unwanted or unplanned pregnancy. How to practice safe sex Ways you can practice safe sex  Limit your sexual partners to only one partner who is having sex with only you.  Avoid using alcohol and drugs before having sex. Alcohol and drugs can affect your judgment.  Before having sex with a new partner: ? Talk to your partner about past partners, past STIs, and drug use. ? Get screened for STIs and discuss the results with your partner. Ask your partner to get screened too.  Check your body regularly for sores, blisters, rashes, or unusual discharge. If you notice any of these problems, visit your health care provider.  Avoid sexual contact if you have symptoms of an infection or you are being treated for an STI.  While having sex, use a condom. Make sure to: ? Use a condom every time you have vaginal, oral, or anal sex. Both females and males should wear condoms during oral sex. ? Keep condoms in place from the beginning to the end of sexual activity. ? Use a latex condom, if possible. Latex condoms offer the best protection. ? Use only water-based lubricants with a condom. Using petroleum-based lubricants or oils will weaken the condom and increase the chance that it will break.   Ways your health care provider can help you practice safe sex  See your health care provider for regular screenings, exams, and tests for STIs.  Talk with your health care provider about what kind of birth control (contraception) is best for you.  Get vaccinated against hepatitis B and human papillomavirus (HPV).  If you are at risk of being infected with HIV (human immunodeficiency virus), talk with your health care provider about taking a prescription medicine to prevent HIV infection. You are at risk for HIV if you: ? Are a man  who has sex with other men. ? Are sexually active with more than one partner. ? Take drugs by injection. ? Have a sex partner who has HIV. ? Have unprotected sex. ? Have sex with someone who has sex with both men and women. ? Have had an STI.   Follow these instructions at home:  Take over-the-counter and prescription medicines only as told by your health care provider.  Keep all follow-up visits. This is important. Where to find more information  Centers for Disease Control and Prevention: FootballExhibition.com.br  Planned Parenthood: www.plannedparenthood.org  Office on [employer] [facility]: http://hoffman.com/ Summary  Practicing safe sex means taking steps before and during sex to reduce your risk getting an STI, giving your partner an STI, and having an unwanted or unplanned pregnancy.  Before having sex with a new partner, talk to your partner about past partners, past STIs, and drug use.  Use a condom every time you have vaginal, oral, or anal sex. Both females and males should wear condoms during oral sex.  Check your body regularly for sores, blisters, rashes, or unusual discharge. If you notice any of these problems, visit your health care provider.  See your health care provider for regular screenings, exams, and tests for STIs. This information is not intended to replace advice given to you by your health care provider. Make sure you discuss any questions you have with your health care provider. Document Revised: 10/24/2019 Document Reviewed: 10/24/2019 Elsevier Patient Education  2021 [employer].  Etonogestrel implant What is this medicine? ETONOGESTREL (et oh noe JES trel) is a contraceptive (birth control) device. It is used to prevent pregnancy. It can be used for up to 3 years. This medicine may be used for other purposes; ask your health care provider or pharmacist if you have questions. COMMON BRAND NAME(S): Implanon, Nexplanon What should I tell my health care provider  before I take this medicine? They need to know if you have any of these conditions:  abnormal vaginal bleeding  blood vessel disease or blood clots  breast, cervical, endometrial, ovarian, liver, or uterine cancer  diabetes  gallbladder disease  heart disease or recent heart attack  high blood pressure  high cholesterol or triglycerides  kidney disease  liver disease  migraine headaches  seizures  stroke  tobacco smoker  an unusual or allergic reaction to etonogestrel, anesthetics or antiseptics, other medicines, foods, dyes, or preservatives  pregnant or trying to get pregnant  breast-feeding How should I use this medicine? This device is inserted just under the skin on the inner side of your upper arm by a health care professional. Talk to your pediatrician regarding the use of this medicine in children. Special care may be needed. Overdosage: If you think you have taken too much of this medicine contact a poison control center or emergency room at once. NOTE: This medicine is only for you. Do not share this medicine with others. What if I miss a dose? This does not apply. What may interact with this medicine? Do not take this medicine with any of the following medications:  amprenavir  fosamprenavir This medicine may also interact with the following medications:  acitretin  aprepitant  armodafinil  bexarotene  bosentan  carbamazepine  certain medicines for fungal infections like fluconazole, ketoconazole, itraconazole and voriconazole  certain medicines to treat hepatitis, HIV or AIDS  cyclosporine  felbamate  griseofulvin  lamotrigine  modafinil  oxcarbazepine  phenobarbital  phenytoin  primidone  rifabutin  rifampin  rifapentine  St. John's wort  topiramate This list may not describe all possible interactions. Give your health care provider a list of all the medicines, herbs, non-prescription drugs, or dietary  supplements you use. Also tell them if you smoke, drink alcohol, or use illegal drugs. Some items may interact with your medicine. What should I watch for while using this medicine? This product does not protect you against HIV infection (AIDS) or other sexually transmitted diseases. You should be able to feel the implant by pressing your fingertips over the skin where it was inserted. Contact your doctor if you cannot feel the implant, and use a non-hormonal birth control method (such as condoms) until your doctor confirms that the implant is in place. Contact your doctor if you think that the implant may have broken or become bent while in your arm. You will receive a user card from your health care provider after the implant is inserted. The card is a record of the location of the implant in your upper arm and when it should be removed. Keep this card with your health records. What side effects may I notice from receiving this medicine? Side effects that you should report to your doctor or health care professional as soon as possible:  allergic reactions like skin rash, itching or hives, swelling of the face, lips, or tongue  breast lumps, breast tissue changes, or discharge  breathing problems  changes in emotions or moods  coughing up blood  if you feel that the  implant may have broken or bent while in your arm  high blood pressure  pain, irritation, swelling, or bruising at the insertion site  scar at site of insertion  signs of infection at the insertion site such as fever, and skin redness, pain or discharge  signs and symptoms of a blood clot such as breathing problems; changes in vision; chest pain; severe, sudden headache; pain, swelling, warmth in the leg; trouble speaking; sudden numbness or weakness of the face, arm or leg  signs and symptoms of liver injury like dark yellow or brown urine; general ill feeling or flu-like symptoms; light-colored stools; loss of appetite;  nausea; right upper belly pain; unusually weak or tired; yellowing of the eyes or skin  unusual vaginal bleeding, discharge Side effects that usually do not require medical attention (report to your doctor or health care professional if they continue or are bothersome):  acne  breast pain or tenderness  headache  irregular menstrual bleeding  nausea This list may not describe all possible side effects. Call your doctor for medical advice about side effects. You may report side effects to FDA at 1-800-FDA-1088. Where should I keep my medicine? This drug is given in a hospital or clinic and will not be stored at home. NOTE: This sheet is a summary. It may not cover all possible information. If you have questions about this medicine, talk to your doctor, pharmacist, or health care provider.  2021 Elsevier/Gold Standard (2019-03-01 11:33:04)

## 2020-07-04 LAB — CERVICOVAGINAL ANCILLARY ONLY
Bacterial Vaginitis (gardnerella): NEGATIVE
Candida Glabrata: NEGATIVE
Candida Vaginitis: NEGATIVE
Chlamydia: POSITIVE — AB
Comment: NEGATIVE
Comment: NEGATIVE
Comment: NEGATIVE
Comment: NEGATIVE
Comment: NEGATIVE
Comment: NORMAL
Neisseria Gonorrhea: NEGATIVE
Trichomonas: NEGATIVE

## 2020-07-05 ENCOUNTER — Telehealth: Payer: Self-pay

## 2020-07-05 ENCOUNTER — Other Ambulatory Visit: Payer: Self-pay | Admitting: Certified Nurse Midwife

## 2020-07-05 DIAGNOSIS — A749 Chlamydial infection, unspecified: Secondary | ICD-10-CM

## 2020-07-05 MED ORDER — AZITHROMYCIN 500 MG PO TABS: 1000.0000 mg | ORAL_TABLET | Freq: Once | ORAL | 1 refills | Status: AC

## 2020-07-05 NOTE — Telephone Encounter (Signed)
[facility] notified via fax- positive chlamydia

## 2020-07-05 NOTE — Progress Notes (Signed)
Rx Azithromycin, see orders.    Serafina Royals, CNM [facility], [facility] 07/05/20 1:34 PM

## 2020-07-05 NOTE — Telephone Encounter (Signed)
Called to [facility] made her next appt for one month

## 2020-07-05 NOTE — Telephone Encounter (Signed)
-----   Message from Gunnar Bulla, CNM sent at 07/05/2020  1:34 PM EST ----- Moshe Cipro: Please contact patient to schedule TOC in one (1) month.  Javona Bergevin: Please notify [facility].   Thanks, JML

## 2020-07-06 ENCOUNTER — Encounter: Payer: Medicaid Other | Admitting: Certified Nurse Midwife

## 2020-07-16 ENCOUNTER — Other Ambulatory Visit: Payer: Self-pay

## 2020-07-16 ENCOUNTER — Encounter: Payer: Self-pay | Admitting: Dietician

## 2020-07-16 ENCOUNTER — Encounter: Payer: Medicaid Other | Attending: Certified Nurse Midwife | Admitting: Dietician

## 2020-07-16 VITALS — Ht 65.0 in | Wt 97.8 lb

## 2020-07-16 DIAGNOSIS — R636 Underweight: Secondary | ICD-10-CM | POA: Diagnosis not present

## 2020-07-16 NOTE — Progress Notes (Signed)
Medical Nutrition Therapy: Visit start time: 1105  end time: 1210  Assessment:  Diagnosis: underweight Past medical history: iron deficiency Psychosocial issues/ stress concerns: patient reports some "sad" days, since father passed away. Stress due to single parenting of 23 year-old and infant.  Preferred learning method:  . No preference indicated  Current weight: 97.8lbs Height: 5'5" Medications, supplements: IUD  Progress and evaluation:   Patient reports always struggling to keep weight up. She did gain weight during pregnancy (119.9 on 01/26/20 3 days prior to delivery) but lost weight again within a few months.   She had 2 pregnancies within 2 years; delivered baby boy on 02/14/19 and baby girl on 01/29/20. Son is eating solid foods, daughter is beginning to eat infant cereal and baby food.  She would like to weigh closer to her pregnancy weight.  Food portions can vary according to choices available, depends on how much she likes the food.   She voices desire to promote healthy eating behaviors in her children.   Patient is using [facility] vouchers.   Physical activity: no structured exercise; primarily childcare.   Dietary Intake:  Usual eating pattern includes 2-3 meals and 1-2 snacks per day. Dining out frequency: 6-10 meals per week.  Breakfast: depends on appetite, occ wakes up late, or is not hungry. Does eat average of 3x a week -- eggs and sausage and waffles or pancakes Snack: none Lunch: sometimes breakfast food; McDonald's -- likes burger, fries, nuggets Snack: crackers; occ plain chips; homemade cookies; rarely ice cream Supper: sometimes [employer]/ Chick fila/ [employer]/ etc; pizza; baked chicken/meat with corn and rice Snack: often sweets ie cookies, candy bars Beverages: sweet tea, Mt Dew, maybe 1 bottle water per day  Nutrition Care Education: Topics covered:   Basic nutrition: basic food groups, appropriate nutrient balance, appropriate meal and snack schedule,  general nutrition guidelines Weight gain: identifying healthy weight, determining reasonable weight gain rate; adding healthy fats, protein powder, or dry milk to foods to boost calories; importance of meals and snacks at regular intervals; reducing sugary beverages to promote appetite for solid foods; supplementing with protein or nutritional drinks; healthy snack options Advanced nutrition:  cooking techniques including batch cooking; baking/ roasting ie sheet pan meals; sauteeing in small amounts of vegetable oils; using partial prepared foods ie rotisserie chicken from grocer with homemade sides; frozen and canned food options for cost savings.    Nutritional Diagnosis:  [place]-3.1 Underweight As related to insufficient calorie intake, history of underweight.  As evidenced by patient with current BMI of 16.4.  Intervention:  . Instruction and discussion as noted above. . Patient has been trying to include high calorie foods to gain weight, and voices strong motivation to gain weight. . Established goals for additional change with input from patient. . Patient is eager to begin on diet changes and to follow up in several weeks.  Education Materials given:  . Gaining Weight in a Healthy Way . Plate Planner with food lists, recommended intake . List of WIC-approved foods . Visit summary with goals/ instructions   Learner/ who was taught:  . Patient   Level of understanding: Marland Kitchen Verbalizes/ demonstrates competency  Demonstrated degree of understanding via:   Teach back Learning barriers: . None  Willingness to learn/ readiness for change: . Eager, change in progress  Monitoring and Evaluation:  Dietary intake, exercise, and body weight      follow up: 08/23/20 at 11:00am

## 2020-07-16 NOTE — Patient Instructions (Signed)
   Make sure to eat a meal or snack every 3-4 hours during the day.   Choose healthy snacks like granola bars, trail mix (especially homemade with nuts, cheerios, dried fruit like raisins or cranberries, and a few m&ms for sweet tooth). Whole milk yogurt is also healthy, or 1/2 sandwich, fruit and cheese.   Work to include a protein food, some starch and/or a fruit, and whenever possible a vegetable with meals.   Try a nutrition drink like Ensure, Boost, carnation breakfast essentials, or a protein drink like premier (or [employer] mart brand) as a snack or when not hungry for a meal.  Make tea at home with less sugar, and drink less Mt. Dew and more water, to help appetite for food.   Add a little extra fat like mayo, salad dressing, or sour cream to foods to boost calories.

## 2020-08-02 ENCOUNTER — Encounter: Payer: Medicaid Other | Admitting: Certified Nurse Midwife

## 2020-08-09 ENCOUNTER — Encounter: Payer: Medicaid Other | Admitting: Certified Nurse Midwife

## 2020-08-20 ENCOUNTER — Encounter: Payer: Self-pay | Admitting: Certified Nurse Midwife

## 2020-08-23 ENCOUNTER — Ambulatory Visit: Payer: Medicaid Other | Admitting: Dietician

## 2020-08-28 ENCOUNTER — Encounter: Payer: Self-pay | Admitting: Certified Nurse Midwife

## 2020-09-27 ENCOUNTER — Encounter: Payer: Medicaid Other | Admitting: Certified Nurse Midwife

## 2020-09-27 NOTE — Patient Instructions (Incomplete)

## 2020-09-28 ENCOUNTER — Ambulatory Visit: Payer: Medicaid Other | Admitting: Dietician

## 2020-11-05 ENCOUNTER — Encounter: Payer: Self-pay | Admitting: Dietician

## 2020-11-05 NOTE — Progress Notes (Signed)
Have not heard back from patient to reschedule her missed appointment from 09/28/20. Sent notification to referring provider.

## 2021-08-05 IMAGING — US US OB COMP LESS 14 WK
1 series · 14 of 28 positions shown · non-contrast
Comparison: None.

CLINICAL DATA: Bleeding

EXAM:
OBSTETRIC <14 WK US AND TRANSVAGINAL OB US
TECHNIQUE: Both transabdominal and transvaginal ultrasound examinations were
performed for complete evaluation of the gestation as well as the
maternal uterus, adnexal regions, and pelvic cul-de-sac.
Transvaginal technique was performed to assess early pregnancy.

[Series 1: us ob comp less 14 wk · 14 of 43 slices shown]
[im 2/43]
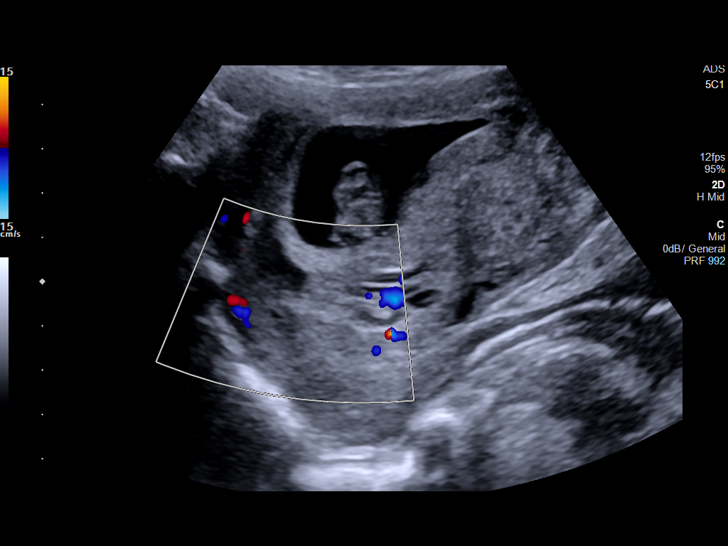
[im 5/43]
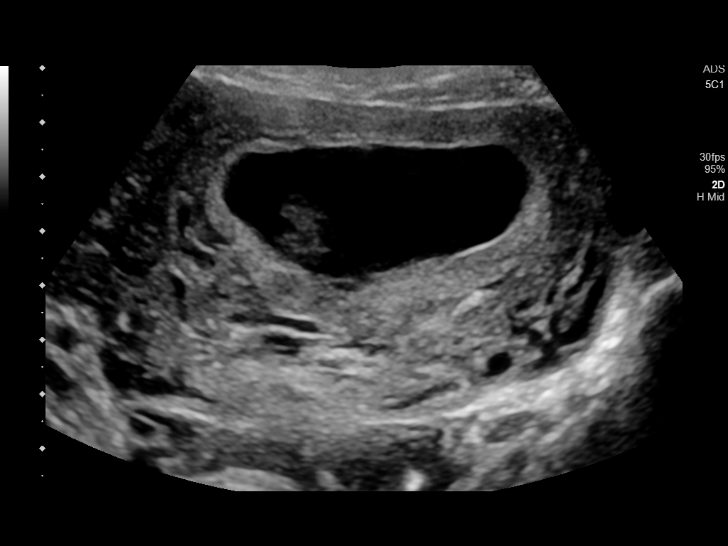
[im 8/43]
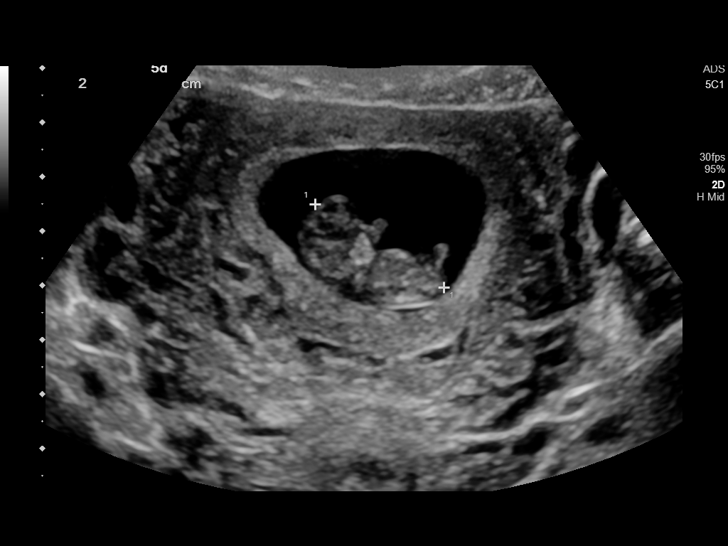
[im 11/43]
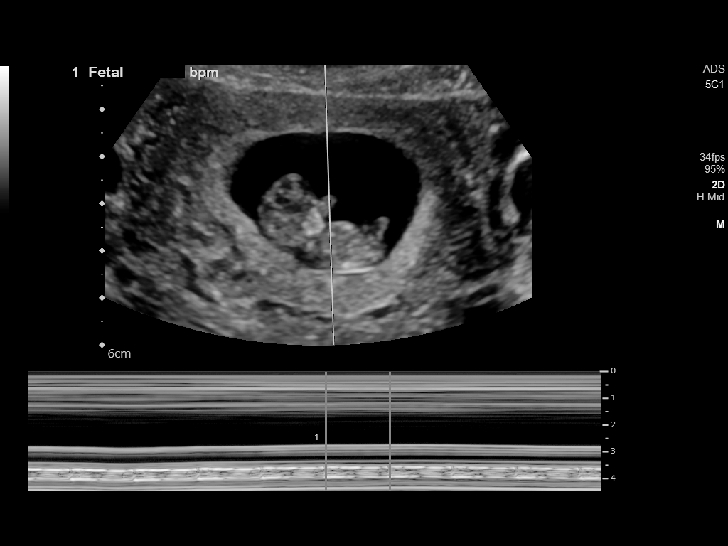
[im 15/43]
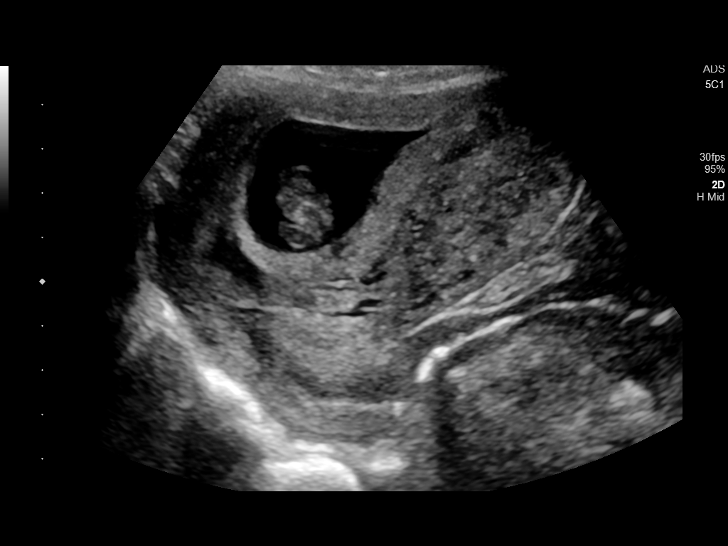
[im 18/43]
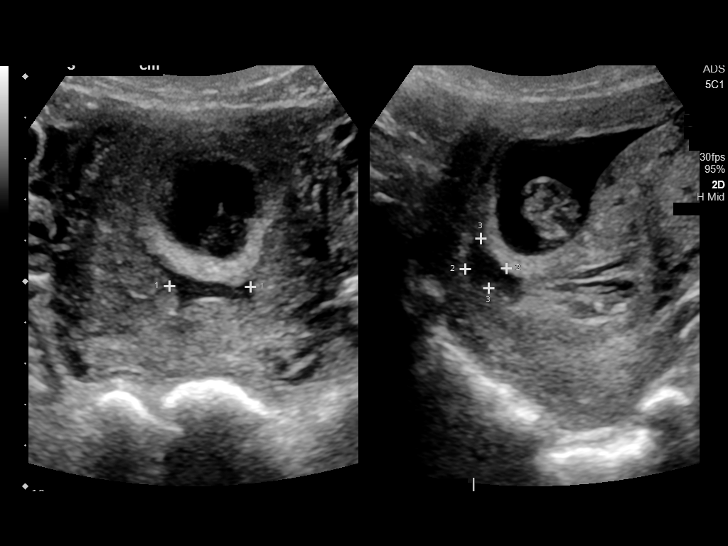
[im 21/43]
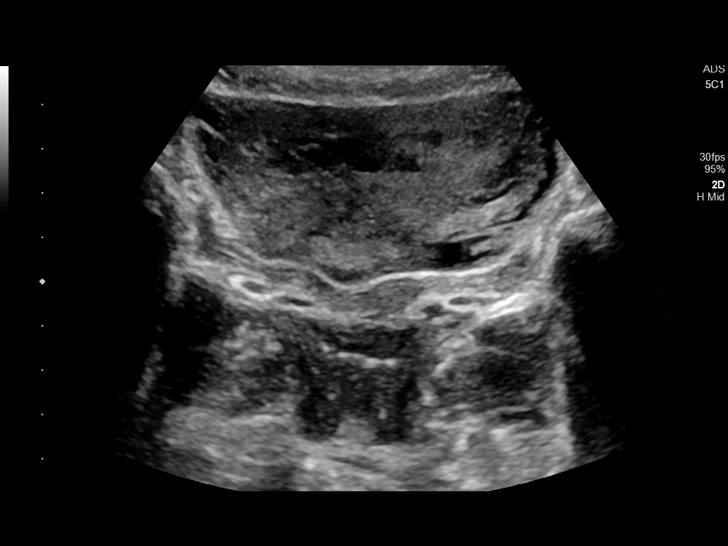
[im 24/43]
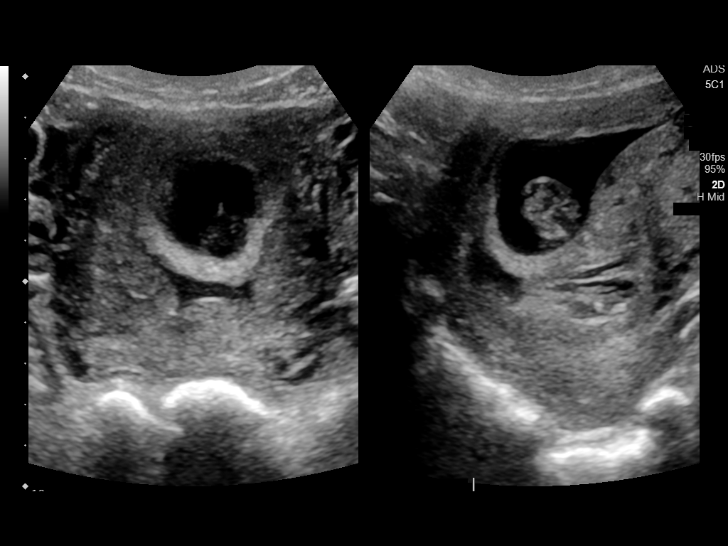
[im 27/43]
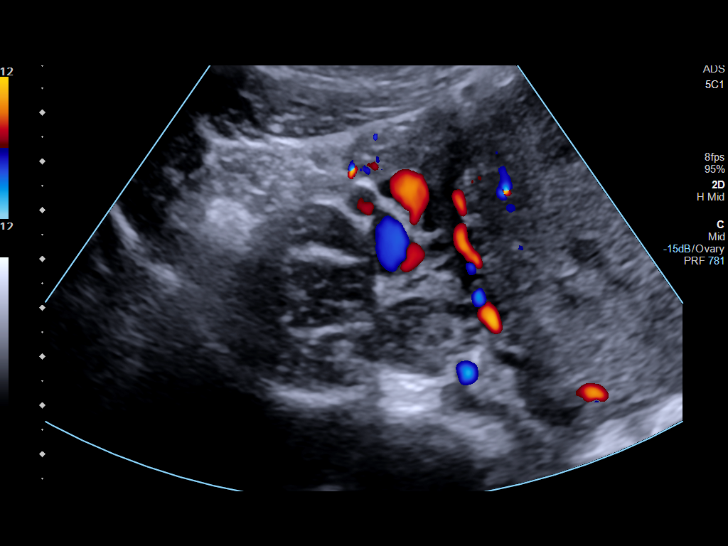
[im 30/43]
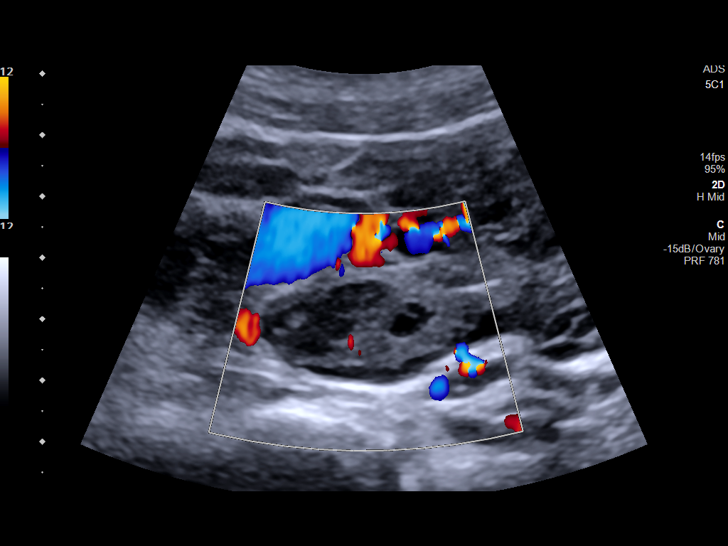
[im 33/43]
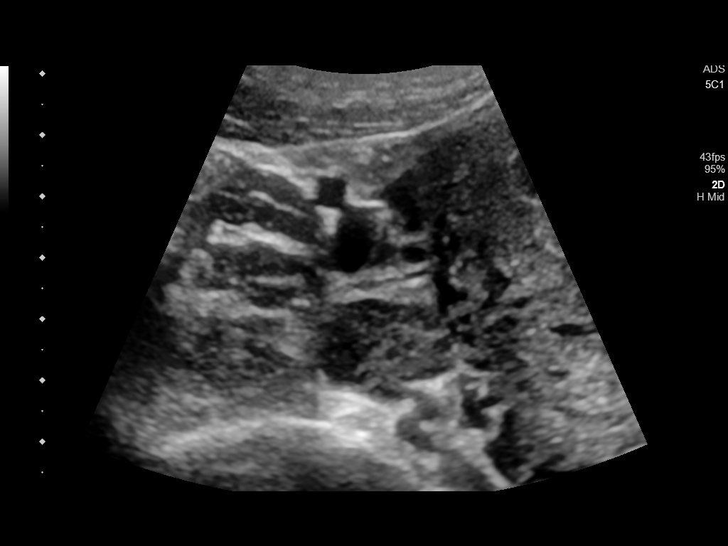
[im 36/43]
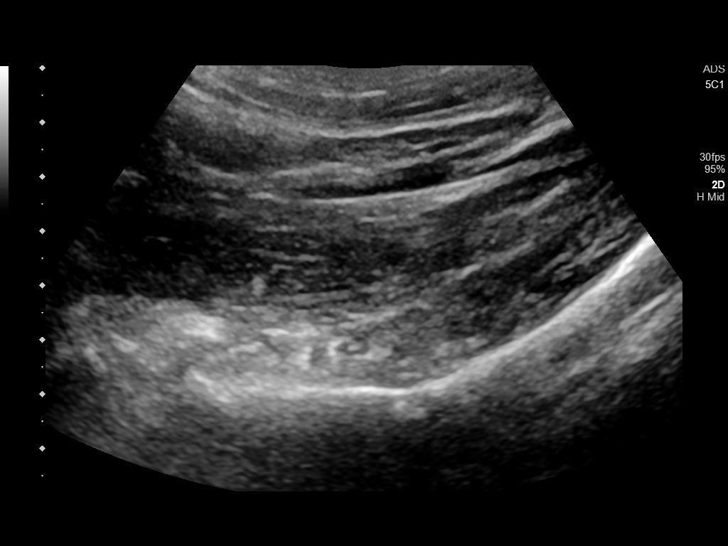
[im 39/43]
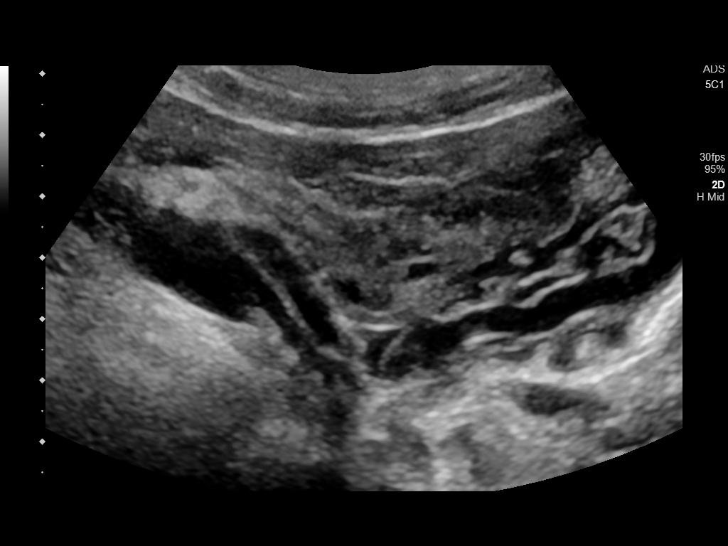
[im 43/43]
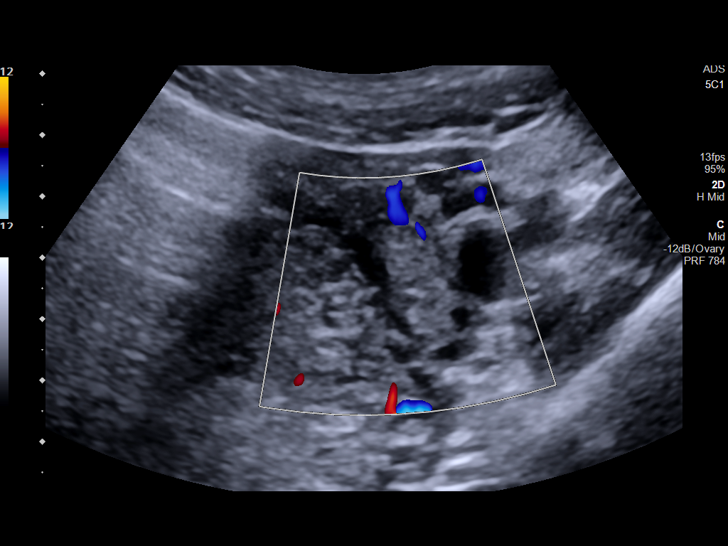

[14 of 28 positions shown; findings below may reference images not displayed]

FINDINGS: Intrauterine gestational sac: Single

Yolk sac:  Not Visualized.

Embryo:  Visualized.

Cardiac Activity: Visualized.

Heart Rate: 160 bpm

CRL:  28.4 mm   9 w   5 d                  US EDC: 02/09/2020

Subchorionic hemorrhage:  Small

Maternal uterus/adnexae: Probable corpus luteum cyst seen within the
right ovary. Normal left ovary.
IMPRESSION: Single live intrauterine pregnancy measuring 9 weeks 5 days. Small
subchorionic hemorrhage.

## 2021-10-31 DIAGNOSIS — Z0000 Encounter for general adult medical examination without abnormal findings: Secondary | ICD-10-CM | POA: Diagnosis not present

## 2021-10-31 DIAGNOSIS — N760 Acute vaginitis: Secondary | ICD-10-CM | POA: Diagnosis not present

## 2021-10-31 DIAGNOSIS — R636 Underweight: Secondary | ICD-10-CM | POA: Diagnosis not present

## 2021-10-31 DIAGNOSIS — N898 Other specified noninflammatory disorders of vagina: Secondary | ICD-10-CM | POA: Diagnosis not present

## 2022-01-13 DIAGNOSIS — Z111 Encounter for screening for respiratory tuberculosis: Secondary | ICD-10-CM | POA: Diagnosis not present

## 2022-01-27 DIAGNOSIS — N898 Other specified noninflammatory disorders of vagina: Secondary | ICD-10-CM | POA: Diagnosis not present

## 2022-01-27 DIAGNOSIS — N760 Acute vaginitis: Secondary | ICD-10-CM | POA: Diagnosis not present

## 2022-01-29 DIAGNOSIS — Z7251 High risk heterosexual behavior: Secondary | ICD-10-CM | POA: Diagnosis not present

## 2022-01-29 DIAGNOSIS — A493 Mycoplasma infection, unspecified site: Secondary | ICD-10-CM | POA: Diagnosis not present

## 2022-07-29 DIAGNOSIS — J020 Streptococcal pharyngitis: Secondary | ICD-10-CM | POA: Diagnosis not present

## 2022-07-29 DIAGNOSIS — R059 Cough, unspecified: Secondary | ICD-10-CM | POA: Diagnosis not present

## 2023-03-16 DIAGNOSIS — Z01419 Encounter for gynecological examination (general) (routine) without abnormal findings: Secondary | ICD-10-CM | POA: Diagnosis not present

## 2023-03-16 DIAGNOSIS — N760 Acute vaginitis: Secondary | ICD-10-CM | POA: Diagnosis not present

## 2023-03-16 DIAGNOSIS — R5383 Other fatigue: Secondary | ICD-10-CM | POA: Diagnosis not present

## 2023-03-16 DIAGNOSIS — E559 Vitamin D deficiency, unspecified: Secondary | ICD-10-CM | POA: Diagnosis not present

## 2023-03-16 DIAGNOSIS — Z0000 Encounter for general adult medical examination without abnormal findings: Secondary | ICD-10-CM | POA: Diagnosis not present

## 2023-03-16 DIAGNOSIS — R399 Unspecified symptoms and signs involving the genitourinary system: Secondary | ICD-10-CM | POA: Diagnosis not present

## 2023-03-16 DIAGNOSIS — R87612 Low grade squamous intraepithelial lesion on cytologic smear of cervix (LGSIL): Secondary | ICD-10-CM | POA: Diagnosis not present

## 2023-03-25 DIAGNOSIS — Z3046 Encounter for surveillance of implantable subdermal contraceptive: Secondary | ICD-10-CM | POA: Diagnosis not present

## 2023-03-25 DIAGNOSIS — M545 Low back pain, unspecified: Secondary | ICD-10-CM | POA: Diagnosis not present

## 2023-06-09 DIAGNOSIS — Z0121 Encounter for dental examination and cleaning with abnormal findings: Secondary | ICD-10-CM | POA: Diagnosis not present
